# Patient Record
Sex: Female | Born: 1945 | Race: White | Hispanic: No | Marital: Married | Smoking: Former smoker
Health system: Southern US, Community
[De-identification: ages and names within clinical notes are randomized; demographics above are authoritative.]

## PROBLEM LIST (undated history)

## (undated) DIAGNOSIS — N979 Female infertility, unspecified: Secondary | ICD-10-CM

## (undated) DIAGNOSIS — C4491 Basal cell carcinoma of skin, unspecified: Secondary | ICD-10-CM

## (undated) DIAGNOSIS — K8020 Calculus of gallbladder without cholecystitis without obstruction: Secondary | ICD-10-CM

## (undated) DIAGNOSIS — H269 Unspecified cataract: Secondary | ICD-10-CM

## (undated) DIAGNOSIS — E079 Disorder of thyroid, unspecified: Secondary | ICD-10-CM

## (undated) DIAGNOSIS — I10 Essential (primary) hypertension: Secondary | ICD-10-CM

## (undated) DIAGNOSIS — E7800 Pure hypercholesterolemia, unspecified: Secondary | ICD-10-CM

## (undated) DIAGNOSIS — J189 Pneumonia, unspecified organism: Secondary | ICD-10-CM

## (undated) DIAGNOSIS — E119 Type 2 diabetes mellitus without complications: Secondary | ICD-10-CM

## (undated) DIAGNOSIS — F419 Anxiety disorder, unspecified: Secondary | ICD-10-CM

## (undated) DIAGNOSIS — K219 Gastro-esophageal reflux disease without esophagitis: Secondary | ICD-10-CM

## (undated) DIAGNOSIS — K589 Irritable bowel syndrome without diarrhea: Secondary | ICD-10-CM

## (undated) DIAGNOSIS — D219 Benign neoplasm of connective and other soft tissue, unspecified: Secondary | ICD-10-CM

## (undated) DIAGNOSIS — R011 Cardiac murmur, unspecified: Secondary | ICD-10-CM

## (undated) DIAGNOSIS — C4490 Unspecified malignant neoplasm of skin, unspecified: Secondary | ICD-10-CM

## (undated) HISTORY — DX: Disorder of thyroid, unspecified: E07.9

## (undated) HISTORY — DX: Essential (primary) hypertension: I10

## (undated) HISTORY — DX: Irritable bowel syndrome, unspecified: K58.9

## (undated) HISTORY — PX: ABDOMINAL HYSTERECTOMY: SHX81

## (undated) HISTORY — PX: MOHS SURGERY: SUR867

## (undated) HISTORY — DX: Benign neoplasm of connective and other soft tissue, unspecified: D21.9

## (undated) HISTORY — DX: Anxiety disorder, unspecified: F41.9

## (undated) HISTORY — DX: Basal cell carcinoma of skin, unspecified: C44.91

## (undated) HISTORY — DX: Unspecified malignant neoplasm of skin, unspecified: C44.90

## (undated) HISTORY — PX: EYE SURGERY: SHX253

## (undated) HISTORY — PX: SPINE SURGERY: SHX786

## (undated) HISTORY — PX: GALLBLADDER SURGERY: SHX652

## (undated) HISTORY — DX: Pure hypercholesterolemia, unspecified: E78.00

## (undated) HISTORY — DX: Unspecified cataract: H26.9

## (undated) HISTORY — DX: Pneumonia, unspecified organism: J18.9

## (undated) HISTORY — PX: LUMBAR DISC SURGERY: SHX700

## (undated) HISTORY — PX: BUNIONECTOMY: SHX129

## (undated) HISTORY — DX: Calculus of gallbladder without cholecystitis without obstruction: K80.20

## (undated) HISTORY — DX: Type 2 diabetes mellitus without complications: E11.9

## (undated) HISTORY — PX: OOPHORECTOMY: SHX86

## (undated) HISTORY — DX: Gastro-esophageal reflux disease without esophagitis: K21.9

## (undated) HISTORY — DX: Female infertility, unspecified: N97.9

## (undated) HISTORY — DX: Cardiac murmur, unspecified: R01.1

---
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
## 1973-03-20 HISTORY — PX: PILONIDAL CYST EXCISION: SHX744

## 1974-03-20 HISTORY — PX: CERVICAL CONE BIOPSY: SUR198

## 1995-03-21 HISTORY — PX: BUNIONECTOMY: SHX129

## 1995-03-21 HISTORY — PX: ABDOMINAL HYSTERECTOMY: SHX81

## 2004-03-20 HISTORY — PX: CATARACT EXTRACTION: SUR2

## 2015-03-25 LAB — HM MAMMOGRAPHY

## 2016-03-09 LAB — LIPID PANEL
Cholesterol: 170 (ref 0–200)
TRIGLYCERIDES: 359 — AB (ref 40–160)

## 2016-03-09 LAB — BASIC METABOLIC PANEL
CREATININE: 0.8 (ref 0.5–1.1)
GLUCOSE: 258
Potassium: 4.2 (ref 3.4–5.3)
Sodium: 136 — AB (ref 137–147)

## 2016-03-09 LAB — CBC AND DIFFERENTIAL
HEMATOCRIT: 39 (ref 36–46)
HEMOGLOBIN: 13.2 (ref 12.0–16.0)
PLATELETS: 307 (ref 150–399)
WBC: 8.7

## 2016-03-09 LAB — TSH: TSH: 3 (ref 0.41–5.90)

## 2016-04-05 LAB — HM MAMMOGRAPHY

## 2016-04-05 LAB — HM DEXA SCAN

## 2016-09-29 ENCOUNTER — Telehealth: Payer: Self-pay

## 2016-09-29 NOTE — Telephone Encounter (Signed)
Updated patient's meds, allergies, PMH, and family history according to new patient paperwork.  Paperwork also indicates patient had bone density test in 2017 (norma) and colonoscopy in 2017 (normal).  Will have to request records.  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  No date or other information is listed.  No imaging in chart.  New patient paperwork is at my desk.

## 2016-10-11 ENCOUNTER — Encounter: Payer: Self-pay | Admitting: Family Medicine

## 2016-10-11 ENCOUNTER — Ambulatory Visit (INDEPENDENT_AMBULATORY_CARE_PROVIDER_SITE_OTHER): Payer: Medicare Other | Admitting: Family Medicine

## 2016-10-11 VITALS — BP 124/76 | HR 83 | Temp 98.4°F | Ht 67.0 in | Wt 220.4 lb

## 2016-10-11 DIAGNOSIS — R05 Cough: Secondary | ICD-10-CM

## 2016-10-11 DIAGNOSIS — I10 Essential (primary) hypertension: Secondary | ICD-10-CM | POA: Diagnosis not present

## 2016-10-11 DIAGNOSIS — E1169 Type 2 diabetes mellitus with other specified complication: Secondary | ICD-10-CM | POA: Insufficient documentation

## 2016-10-11 DIAGNOSIS — E119 Type 2 diabetes mellitus without complications: Secondary | ICD-10-CM

## 2016-10-11 DIAGNOSIS — Z79890 Hormone replacement therapy (postmenopausal): Secondary | ICD-10-CM | POA: Diagnosis not present

## 2016-10-11 DIAGNOSIS — E785 Hyperlipidemia, unspecified: Secondary | ICD-10-CM | POA: Diagnosis not present

## 2016-10-11 DIAGNOSIS — R059 Cough, unspecified: Secondary | ICD-10-CM

## 2016-10-11 DIAGNOSIS — F419 Anxiety disorder, unspecified: Secondary | ICD-10-CM | POA: Diagnosis not present

## 2016-10-11 DIAGNOSIS — G44009 Cluster headache syndrome, unspecified, not intractable: Secondary | ICD-10-CM

## 2016-10-11 DIAGNOSIS — E1159 Type 2 diabetes mellitus with other circulatory complications: Secondary | ICD-10-CM | POA: Insufficient documentation

## 2016-10-11 DIAGNOSIS — E039 Hypothyroidism, unspecified: Secondary | ICD-10-CM | POA: Diagnosis not present

## 2016-10-11 DIAGNOSIS — Z85828 Personal history of other malignant neoplasm of skin: Secondary | ICD-10-CM | POA: Diagnosis not present

## 2016-10-11 DIAGNOSIS — J029 Acute pharyngitis, unspecified: Secondary | ICD-10-CM | POA: Insufficient documentation

## 2016-10-11 LAB — CBC WITH DIFFERENTIAL/PLATELET
Basophils Absolute: 0.1 10*3/uL (ref 0.0–0.1)
Basophils Relative: 1.3 % (ref 0.0–3.0)
Eosinophils Absolute: 0.2 10*3/uL (ref 0.0–0.7)
Eosinophils Relative: 2.6 % (ref 0.0–5.0)
HCT: 44.9 % (ref 36.0–46.0)
Hemoglobin: 14.8 g/dL (ref 12.0–15.0)
Lymphocytes Relative: 20.9 % (ref 12.0–46.0)
Lymphs Abs: 1.8 10*3/uL (ref 0.7–4.0)
MCHC: 32.9 g/dL (ref 30.0–36.0)
MCV: 90.4 fl (ref 78.0–100.0)
Monocytes Absolute: 0.4 10*3/uL (ref 0.1–1.0)
Monocytes Relative: 4.2 % (ref 3.0–12.0)
Neutro Abs: 6.2 10*3/uL (ref 1.4–7.7)
Neutrophils Relative %: 71 % (ref 43.0–77.0)
Platelets: 290 10*3/uL (ref 150.0–400.0)
RBC: 4.96 Mil/uL (ref 3.87–5.11)
RDW: 14.6 % (ref 11.5–15.5)
WBC: 8.7 10*3/uL (ref 4.0–10.5)

## 2016-10-11 LAB — COMPREHENSIVE METABOLIC PANEL
ALT: 26 U/L (ref 0–35)
AST: 21 U/L (ref 0–37)
Albumin: 4.1 g/dL (ref 3.5–5.2)
Alkaline Phosphatase: 62 U/L (ref 39–117)
BUN: 20 mg/dL (ref 6–23)
CO2: 27 mEq/L (ref 19–32)
Calcium: 10 mg/dL (ref 8.4–10.5)
Chloride: 101 mEq/L (ref 96–112)
Creatinine, Ser: 0.84 mg/dL (ref 0.40–1.20)
GFR: 71.49 mL/min (ref >60.00)
Glucose, Bld: 151 mg/dL — ABNORMAL HIGH (ref 70–99)
Potassium: 3.8 mEq/L (ref 3.5–5.1)
Sodium: 138 mEq/L (ref 135–145)
Total Bilirubin: 0.7 mg/dL (ref 0.2–1.2)
Total Protein: 7.2 g/dL (ref 6.0–8.3)

## 2016-10-11 LAB — T4, FREE: Free T4: 1.43 ng/dL (ref 0.60–1.60)

## 2016-10-11 LAB — LIPID PANEL
Cholesterol: 177 mg/dL (ref 0–200)
HDL: 46.9 mg/dL (ref >39.00)
NonHDL: 129.68
Total CHOL/HDL Ratio: 4
Triglycerides: 250 mg/dL — ABNORMAL HIGH (ref 0.0–149.0)
VLDL: 50 mg/dL — ABNORMAL HIGH (ref 0.0–40.0)

## 2016-10-11 LAB — LDL CHOLESTEROL, DIRECT: Direct LDL: 93 mg/dL

## 2016-10-11 LAB — TSH: TSH: 1.16 u[IU]/mL (ref 0.35–4.50)

## 2016-10-11 LAB — HEMOGLOBIN A1C: Hgb A1c MFr Bld: 6.8 % — ABNORMAL HIGH (ref 4.6–6.5)

## 2016-10-11 MED ORDER — METFORMIN HCL ER 500 MG PO TB24: 500.0000 mg | ORAL_TABLET | Freq: Three times a day (TID) | ORAL | 1 refills | Status: DC

## 2016-10-11 MED ORDER — ALPRAZOLAM 0.5 MG PO TABS: 0.5000 mg | ORAL_TABLET | Freq: Every evening | ORAL | 2 refills | Status: DC | PRN

## 2016-10-11 MED ORDER — ATORVASTATIN CALCIUM 40 MG PO TABS: 40.0000 mg | ORAL_TABLET | Freq: Every day | ORAL | 3 refills | Status: DC

## 2016-10-11 MED ORDER — METFORMIN HCL ER (MOD) 500 MG PO TB24: 500.0000 mg | ORAL_TABLET | Freq: Three times a day (TID) | ORAL | 1 refills | Status: DC

## 2016-10-11 MED ORDER — PIOGLITAZONE HCL 30 MG PO TABS: 30.0000 mg | ORAL_TABLET | Freq: Every day | ORAL | 1 refills | Status: DC

## 2016-10-11 NOTE — Patient Instructions (Signed)
We have placed referrals to gynecology, podiatry, and dermatology for you.  You will be contacted to schedule appointments.

## 2016-10-11 NOTE — Progress Notes (Signed)
Brenda Griffin is a 71 y.o. female is here to [facility].   Patient Care Team: Briscoe Deutscher, DO as PCP - General (Family Medicine)   History of Present Illness:   Brenda Griffin, CMA, acting as scribe for Dr. Juleen China.  HPI: Patient comes in to establish care today.  States she has a sore throat and a cough.  This has been going on for 1 month.  She went to an urgent care some weeks ago and was diagnosed with a sinus infection.  She took an antibiotic and that cleared up the sinus infection.  She continues to have a cough, however.  She has been using sugar-free cough drops.  Cough is nonproductive.  Patient would like refills on her HRT.  She has taken Ambien in the past but states she rarely takes it anymore.    Patient is diabetic.  Diagnosed 8 years ago.  States she was initially told she was prediabetic and then a new doctor told her she had been diabetic for 2 years.  Unable to take Victoza due to diarrhea.  Taking extended release metformin, Jardiance, Actos, and Amaryl.  She thinks her last A1c was 7.0.    She has a history of cluster headaches and was taking Fioricet for them.  States that after her hysterectomy her headaches improved.  Rarely takes Fioricet anymore.  Health Maintenance Due  Topic Date Due  . HEMOGLOBIN A1C  07-Dec-1945  . Hepatitis C Screening  Apr 04, 1945  . FOOT EXAM  12/20/1957  . OPHTHALMOLOGY EXAM  12/20/1957  . TETANUS/TDAP  12/21/1966  . MAMMOGRAM  12/20/1997  . COLONOSCOPY  12/20/1997  . DEXA SCAN  12/20/2012  . PNA vac Low Risk Adult (1 of 2 - PCV13) 12/20/2012   Depression screen PHQ 2/9 10/11/2016  Decreased Interest 0  Down, Depressed, Hopeless 0  PHQ - 2 Score 0   PMHx, SurgHx, SocialHx, Medications, and Allergies were reviewed in the Visit Navigator and updated as appropriate.   Past Medical History:  Diagnosis Date  . Anxiety   . Cancer ([place])    skin  . Cataract   . Diabetes mellitus without complication ([place])   . GERD  (gastroesophageal reflux disease)   . Heart murmur   . Hypertension   . Thyroid disease     Past Surgical History:  Procedure Laterality Date  . ABDOMINAL HYSTERECTOMY    . EYE SURGERY    . GALLBLADDER SURGERY    . SPINE SURGERY      Family History  Problem Relation Age of Onset  . Stroke Mother   . Heart disease Father   . Hyperlipidemia Father   . Hypertension Father   . Hypertension Sister   . Heart disease Paternal Grandmother   . Hypertension Paternal Grandmother   . Diabetes Brother   . Hypertension Brother   . Diabetes Brother    Social History  Substance Use Topics  . Smoking status: Former Research scientist (life sciences)  . Smokeless tobacco: Never Used  . Alcohol use 0.6 - 1.2 oz/week    1 - 2 Glasses of wine per week   Current Medications and Allergies:   Current Outpatient Prescriptions:  .  acetaminophen (TYLENOL) 500 MG tablet, Take 500 mg by mouth as needed., Disp: , Rfl:  .  ALPRAZolam (XANAX) 0.5 MG tablet, Take 1 tablet (0.5 mg total) by mouth at bedtime as needed for anxiety., Disp: 30 tablet, Rfl: 2 .  atorvastatin (LIPITOR) 40 MG tablet, Take 1 tablet (40 mg  total) by mouth daily., Disp: 90 tablet, Rfl: 3 .  Biotin 10000 MCG TABS, Take 1 tablet by mouth daily as needed., Disp: , Rfl:  .  Butalbital-APAP-Caffeine 50-300-40 MG CAPS, Take 1 tablet by mouth as needed., Disp: , Rfl:  .  diphenhydrAMINE (BENADRYL) 25 MG tablet, Take 25 mg by mouth as needed., Disp: , Rfl:  .  empagliflozin (JARDIANCE) 10 MG TABS tablet, Take 10 mg by mouth daily., Disp: , Rfl:  .  estradiol (ESTRACE) 1 MG tablet, Take 1 mg by mouth daily., Disp: , Rfl:  .  glimepiride (AMARYL) 2 MG tablet, Take 2 mg by mouth 4 (four) times daily., Disp: , Rfl:  .  levothyroxine (SYNTHROID, LEVOTHROID) 100 MCG tablet, Take 100 mcg by mouth daily before breakfast., Disp: , Rfl:  .  loperamide (IMODIUM A-D) 2 MG tablet, Take 2 mg by mouth as needed for diarrhea or loose stools., Disp: , Rfl:  .   losartan-hydrochlorothiazide (HYZAAR) 100-12.5 MG tablet, Take 1 tablet by mouth daily., Disp: , Rfl:  .  metFORMIN (GLUCOPHAGE-XR) 500 MG 24 hr tablet, Take 1 tablet (500 mg total) by mouth 3 (three) times daily., Disp: 270 tablet, Rfl: 1 .  pantoprazole (PROTONIX) 40 MG tablet, Take 40 mg by mouth daily., Disp: , Rfl:  .  pioglitazone (ACTOS) 30 MG tablet, Take 1 tablet (30 mg total) by mouth daily., Disp: 90 tablet, Rfl: 1 .  senna (SENOKOT) 8.6 MG tablet, Take 1 tablet by mouth as needed for constipation., Disp: , Rfl:  .  zolpidem (AMBIEN CR) 6.25 MG CR tablet, Take 6.25 mg by mouth at bedtime as needed for sleep., Disp: , Rfl:   Allergies  Allergen Reactions  . Bacitracin Rash  . Benzoic Acid Rash  . Benzyl Salicylate Rash  . Formaldehyde Rash  . Geraniol Rash  . Hydroxyethyl Methacrylate Rash  . Methyl Methacrylate [Methylmethacrylate] Rash  . Other Rash    Amyl Cinnamaldehyde  . Adhesive [Tape] Itching  . Aspirin Nausea And Vomiting  . Codeine Nausea And Vomiting  . Victoza [Liraglutide] Diarrhea  . Butanediol [Butylene Glycol] Rash   Review of Systems:   Pertinent items are noted in the HPI. Otherwise, ROS is negative.  Vitals:   Vitals:   10/11/16 1007  BP: 124/76  Pulse: 83  Temp: 98.4 F (36.9 C)  TempSrc: Oral  SpO2: 97%  Weight: 220 lb 6.4 oz (100 kg)  Height: 5\' 7"  (1.702 m)     Body mass index is 34.52 kg/m.  Physical Exam:   Physical Exam  Constitutional: She appears well-nourished.  HENT:  Head: Normocephalic and atraumatic.  Eyes: Pupils are equal, round, and reactive to light. EOM are normal.  Neck: Normal range of motion. Neck supple.  Cardiovascular: Normal rate, regular rhythm, normal heart sounds and intact distal pulses.   Pulmonary/Chest: Effort normal.  Abdominal: Soft.  Skin: Skin is warm.  Psychiatric: She has a normal mood and affect. Her behavior is normal.  Nursing note and vitals reviewed.  Assessment and Plan:   Problem    Hormone Replacement Therapy   The patient is currently on hormone replacement therapy. She wishes to continue with this. I let her know that I'm okay with refilling her prescription at this time, but I will be ultimately having Gynecology take over.   Cluster Headaches   Nearly resolved. Rarely needs butalbital.   Type 2 Diabetes Mellitus Without Complication, Without Long-Term Current Use of Insulin (Hcc)   Controlled per patient.  No signs of complications, medication side effects, or red flags.  Continue current regimen.  Labs pending.    Hypothyroidism   Controlled per patient.  No signs of complications, medication side effects, or red flags.  Continue current regimen.  Labs pending.    Cough   Bronchospasm associated with recent illness. Discussed symptomatic care and red flags for calling. Warned her that it may take up to 6 weeks for the cough to resolved.    Essential Hypertension   Controlled per patient.  No signs of complications, medication side effects, or red flags.  Continue current regimen.  Labs pending.    History of Skin Cancer   Referral to Dermatology.   Hyperlipidemia   Controlled per patient.  No signs of complications, medication side effects, or red flags.  Continue current regimen.  Labs pending.    Anxiety   Uses Xanax sparingly. No concerns. We discussed options for treatment of anxiety including therapy and/or medication. Discussed potential risks, expected benefits, possible side effects of the medicine. We also discussed how to take it correctly and dosing instructions. If she has any significant side effects to the medicine, she is to stop it and call for advice.   Morbid Obesity (Hcc)   The patient is asked to make an attempt to improve diet and exercise patterns to aid in medical management of this problem.       Orders Placed This Encounter  Procedures  . CBC with Differential/Platelet  . Comprehensive metabolic panel  . Lipid panel  .  TSH  . T4, free  . Hemoglobin A1c  . Ambulatory referral to Obstetrics / Gynecology  . Ambulatory referral to Dermatology  . Ambulatory referral to Podiatry   . Reviewed expectations re: course of current medical issues. . Discussed self-management of symptoms. . Outlined signs and symptoms indicating need for more acute intervention. . Patient verbalized understanding and all questions were answered. Marland Kitchen Health Maintenance issues including appropriate healthy diet, exercise, and smoking avoidance were discussed with patient. . See orders for this visit as documented in the electronic medical record. . Patient received an After Visit Summary.  CMA served as Education administrator during this visit. History, Physical, and Plan performed by medical provider. The above documentation has been reviewed and is accurate and complete. Briscoe Deutscher, D.O.  Briscoe Deutscher, DO [place] [place] 10/11/2016  Future Appointments Date Time Provider [place]  01/10/2017 10:00 AM Briscoe Deutscher, DO [facility] None

## 2016-10-17 ENCOUNTER — Telehealth: Payer: Self-pay | Admitting: Obstetrics and Gynecology

## 2016-10-17 NOTE — Telephone Encounter (Signed)
Called and left a message for patient to call back to schedule a new patient doctor referral. °

## 2016-11-01 ENCOUNTER — Telehealth: Payer: Self-pay | Admitting: Family Medicine

## 2016-11-01 NOTE — Telephone Encounter (Signed)
MEDICATION: butal-acet-caff 50-325-40MG   PHARMACY:    [facility] - [place], [place] - [address] [phone] (Phone) [phone] (Fax)     IS THIS A 90 DAY SUPPLY : Y  IS PATIENT OUT OF MEDICTAION: Y  IF NOT; HOW MUCH IS LEFT:   LAST APPOINTMENT DATE: 10/11/16  NEXT APPOINTMENT DATE: 01/10/17  OTHER COMMENTS:    **Let patient know to contact pharmacy at the end of the day to make sure medication is ready. **  ** Please notify patient to allow 48-72 hours to process**  **Encourage patient to contact the pharmacy for refills or they can request refills through [facility]**

## 2016-11-01 NOTE — Telephone Encounter (Signed)
Please advise 

## 2016-11-02 ENCOUNTER — Other Ambulatory Visit: Payer: Self-pay

## 2016-11-02 MED ORDER — BUTALBITAL-APAP-CAFFEINE 50-300-40 MG PO CAPS: 1.0000 | ORAL_CAPSULE | ORAL | 0 refills | Status: DC | PRN

## 2016-11-02 NOTE — Telephone Encounter (Signed)
Okay refill. 

## 2016-11-03 ENCOUNTER — Other Ambulatory Visit: Payer: Self-pay

## 2016-11-03 MED ORDER — BUTALBITAL-APAP-CAFFEINE 50-325-40 MG PO TABS
1.0000 | ORAL_TABLET | ORAL | 0 refills | Status: DC | PRN
Start: 2016-11-03 — End: 2016-12-13

## 2016-11-03 NOTE — Telephone Encounter (Signed)
Please call pharmacy to advise on whether or not another version of the script is ok for pt to take. Insurance does not cover tablet form and the tylenol is 25mg  higher than what they want to consider.  Ty,  -LL

## 2016-11-03 NOTE — Telephone Encounter (Signed)
Spoke with pharmacy.  Corrected script sent in.

## 2016-12-04 ENCOUNTER — Ambulatory Visit: Payer: Medicare Other | Admitting: Obstetrics and Gynecology

## 2016-12-04 ENCOUNTER — Telehealth: Payer: Self-pay | Admitting: Family Medicine

## 2016-12-04 ENCOUNTER — Encounter: Payer: Self-pay | Admitting: Family Medicine

## 2016-12-04 ENCOUNTER — Ambulatory Visit (INDEPENDENT_AMBULATORY_CARE_PROVIDER_SITE_OTHER): Payer: Medicare Other | Admitting: Family Medicine

## 2016-12-04 VITALS — BP 144/68 | HR 97 | Temp 98.4°F | Wt 220.6 lb

## 2016-12-04 DIAGNOSIS — J069 Acute upper respiratory infection, unspecified: Secondary | ICD-10-CM | POA: Diagnosis not present

## 2016-12-04 DIAGNOSIS — B9789 Other viral agents as the cause of diseases classified elsewhere: Secondary | ICD-10-CM | POA: Diagnosis not present

## 2016-12-04 NOTE — Patient Instructions (Signed)
For muscle aches, headache, sore throat you can take over the counter acetaminophen as directed on the package For nasal congestion you can use Afrin nasal spray twice a day for up to 3 days, and/or saline nasal spray For cough you can use Delsym cough syrup Please come back to see [place] or go to the emergency department if you are not better in 5 to 7 days or if you develop fever over 101 for more than 48 hours or if you develop wheezing or shortness of breath.

## 2016-12-04 NOTE — Telephone Encounter (Signed)
Noted  

## 2016-12-04 NOTE — Telephone Encounter (Signed)
Patient is being seen at 11am with Debbie for sore throat, ears hurt, hurts when swallowing

## 2016-12-04 NOTE — Progress Notes (Signed)
Subjective:    Patient ID: Brenda Griffin, female    DOB: 1945-12-16, 71 y.o.   MRN: [id number]  HPI This is a 71 yo female who presents today with sore throat x 3 days, ears stopped up, cough occasionally productive. Sinuses throbbing. Pain with swallowing. Minor temperature per patient. No nausea or vomiting. No SOB, no wheeze. Taking Tylenol Cold medication without relief yesterday. Slept very well last night. Has not taken anything for symptoms since yesterday afternoon. Has seasonal allergies, not currently taking any medication. Tried fluticasone in past, caused nose bleeds.  Husband with sore throat x 1 day recently. No other sick contacts.     Past Medical History:  Diagnosis Date  . Anxiety   . Cancer ([place])    skin  . Cataract   . Diabetes mellitus without complication ([place])   . GERD (gastroesophageal reflux disease)   . Heart murmur   . Hypertension   . Thyroid disease    Past Surgical History:  Procedure Laterality Date  . ABDOMINAL HYSTERECTOMY    . EYE SURGERY    . GALLBLADDER SURGERY    . SPINE SURGERY     Family History  Problem Relation Age of Onset  . Stroke Mother   . Heart disease Father   . Hyperlipidemia Father   . Hypertension Father   . Hypertension Sister   . Heart disease Paternal Grandmother   . Hypertension Paternal Grandmother   . Diabetes Brother   . Hypertension Brother   . Diabetes Brother    Social History  Substance Use Topics  . Smoking status: Former Research scientist (life sciences)  . Smokeless tobacco: Never Used  . Alcohol use 0.6 - 1.2 oz/week    1 - 2 Glasses of wine per week      Review of Systems Per HPI    Objective:   Physical Exam  Constitutional: She is oriented to person, place, and time. She appears well-developed and well-nourished. No distress.  HENT:  Head: Normocephalic and atraumatic.  Right Ear: Tympanic membrane, external ear and ear canal normal.  Left Ear: Tympanic membrane, external ear and ear canal normal.  Nose: Nose  normal.  Mouth/Throat: Uvula is midline. Posterior oropharyngeal erythema (mild) present. No oropharyngeal exudate or posterior oropharyngeal edema.  Post nasal drainage noted.   Eyes: Conjunctivae are normal.  Neck: Normal range of motion. Neck supple.  Cardiovascular: Normal rate, regular rhythm and normal heart sounds.   Pulmonary/Chest: Effort normal and breath sounds normal.  Lymphadenopathy:    She has no cervical adenopathy.  Neurological: She is alert and oriented to person, place, and time.  Skin: Skin is warm and dry. She is not diaphoretic.  Psychiatric: She has a normal mood and affect. Her behavior is normal. Judgment and thought content normal.  Vitals reviewed.     BP (!) 144/68 (BP Location: Left Arm, Patient Position: Sitting, Cuff Size: Normal)   Pulse 97   Temp 98.4 F (36.9 C) (Oral)   Wt 220 lb 9.6 oz (100.1 kg)   SpO2 96%   BMI 34.55 kg/m  Wt Readings from Last 3 Encounters:  12/04/16 220 lb 9.6 oz (100.1 kg)  10/11/16 220 lb 6.4 oz (100 kg)       Assessment & Plan:  1. Viral URI with cough - Provided written and verbal information regarding diagnosis and treatment. - Patient Instructions  For muscle aches, headache, sore throat you can take over the counter acetaminophen as directed on the package For nasal congestion you  can use Afrin nasal spray twice a day for up to 3 days, and/or saline nasal spray For cough you can use Delsym cough syrup Please come back to see [place] or go to the emergency department if you are not better in 5 to 7 days or if you develop fever over 101 for more than 48 hours or if you develop wheezing or shortness of breath.    Clarene Reamer, FNP-BC  [facility] [place]  12/04/2016 11:24 AM

## 2016-12-13 ENCOUNTER — Ambulatory Visit (INDEPENDENT_AMBULATORY_CARE_PROVIDER_SITE_OTHER): Payer: Medicare Other | Admitting: Obstetrics and Gynecology

## 2016-12-13 ENCOUNTER — Encounter: Payer: Self-pay | Admitting: Obstetrics and Gynecology

## 2016-12-13 VITALS — BP 140/72 | HR 80 | Resp 16 | Ht 66.0 in | Wt 220.0 lb

## 2016-12-13 DIAGNOSIS — Z7189 Other specified counseling: Secondary | ICD-10-CM | POA: Diagnosis not present

## 2016-12-13 DIAGNOSIS — N9089 Other specified noninflammatory disorders of vulva and perineum: Secondary | ICD-10-CM

## 2016-12-13 DIAGNOSIS — Z01419 Encounter for gynecological examination (general) (routine) without abnormal findings: Secondary | ICD-10-CM | POA: Diagnosis not present

## 2016-12-13 MED ORDER — ESTRADIOL 1 MG PO TABS: 1.0000 mg | ORAL_TABLET | Freq: Every day | ORAL | 3 refills | Status: DC

## 2016-12-13 NOTE — Progress Notes (Addendum)
71 y.o. G0P0000 MarriedCaucasianF here for HRT consult. She is on estradiol 1 mg a day, has been on them for 18+ years since she had her hysterectomy/BSO. She wants to stay on the HRT. She has tried going down on the HRT 4 years ago and again 3 years ago. Each time she has night sweats, dry eye, dry skin. She can't use the patch, has an allergy to the adhesive.  On her current dose she feels good, no menopausal symptoms.  Sexually active, no dyspareunia.  She has diabetes, last HgbA1C was 6.8. She denies a h/o CAD, PVD.  She is due for an annual exam.     No LMP recorded. Patient is postmenopausal.          Sexually active: Yes.    The current method of family planning is post menopausal status.    Exercising: No.  The patient does not participate in regular exercise at present. Smoker:  Former smoker  Health Maintenance: Pap:  2015 WNL per patient  History of abnormal Pap:  Yes- Cervical Cone biopsy  MMG:  2018 WNL per patient  Colonoscopy:  2018 WNL per patient  BMD:   2018 WNL per patient  TDaP:  unsure Gardasil: N/A   reports that she has quit smoking. She has never used smokeless tobacco. She reports that she drinks about 0.6 - 1.2 oz of alcohol per week . She reports that she does not use drugs.Retired, moved here in 3/18 from [place].   Past Medical History:  Diagnosis Date  . Anxiety   . Cancer ([place])    skin  . Cataract   . Diabetes mellitus without complication ([place])   . Fibroid   . GERD (gastroesophageal reflux disease)   . Heart murmur   . Hypercholesterolemia   . Hypertension   . Infertility, female   . Thyroid disease     Past Surgical History:  Procedure Laterality Date  . ABDOMINAL HYSTERECTOMY    . BUNIONECTOMY  1997  . CERVICAL CONE BIOPSY  1976  . EYE SURGERY    . GALLBLADDER SURGERY    . MOHS SURGERY  2005/2007  . OOPHORECTOMY    . PILONIDAL CYST EXCISION  1975  . SPINE SURGERY    . TONSILLECTOMY  1957    Current Outpatient Prescriptions   Medication Sig Dispense Refill  . acetaminophen (TYLENOL) 500 MG tablet Take 500 mg by mouth as needed.    . ALPRAZolam (XANAX) 0.5 MG tablet Take 1 tablet (0.5 mg total) by mouth at bedtime as needed for anxiety. 30 tablet 2  . atorvastatin (LIPITOR) 40 MG tablet Take 1 tablet (40 mg total) by mouth daily. 90 tablet 3  . Biotin 10000 MCG TABS Take 1 tablet by mouth daily as needed.    . Butalbital-APAP-Caffeine 50-300-40 MG CAPS Take 1 tablet by mouth as needed. 84 capsule 0  . diphenhydrAMINE (BENADRYL) 25 MG tablet Take 25 mg by mouth as needed.    . empagliflozin (JARDIANCE) 10 MG TABS tablet Take 10 mg by mouth daily.    Marland Kitchen estradiol (ESTRACE) 1 MG tablet Take 1 mg by mouth daily.    Marland Kitchen glimepiride (AMARYL) 2 MG tablet Take 2 mg by mouth 4 (four) times daily.    Marland Kitchen levothyroxine (SYNTHROID, LEVOTHROID) 100 MCG tablet Take 100 mcg by mouth daily before breakfast.    . loperamide (IMODIUM A-D) 2 MG tablet Take 2 mg by mouth as needed for diarrhea or loose stools.    Marland Kitchen  losartan-hydrochlorothiazide (HYZAAR) 100-12.5 MG tablet Take 1 tablet by mouth daily.    . metFORMIN (GLUCOPHAGE-XR) 500 MG 24 hr tablet Take 1 tablet (500 mg total) by mouth 3 (three) times daily. 270 tablet 1  . pantoprazole (PROTONIX) 40 MG tablet Take 40 mg by mouth daily.    . pioglitazone (ACTOS) 30 MG tablet Take 1 tablet (30 mg total) by mouth daily. 90 tablet 1  . senna (SENOKOT) 8.6 MG tablet Take 1 tablet by mouth as needed for constipation.    Marland Kitchen zolpidem (AMBIEN CR) 6.25 MG CR tablet Take 6.25 mg by mouth at bedtime as needed for sleep.     No current facility-administered medications for this visit.     Family History  Problem Relation Age of Onset  . Stroke Mother   . Heart disease Father   . Hyperlipidemia Father   . Hypertension Father   . Hypertension Sister   . Heart disease Paternal Grandmother   . Hypertension Paternal Grandmother   . Diabetes Brother   . Hypertension Brother   . Diabetes Brother      Review of Systems  Constitutional: Negative.   HENT: Negative.   Eyes: Negative.   Respiratory: Negative.   Cardiovascular: Negative.   Gastrointestinal: Negative.   Endocrine: Negative.   Genitourinary: Negative.   Musculoskeletal: Negative.   Skin: Negative.   Allergic/Immunologic: Negative.   Neurological: Negative.   Psychiatric/Behavioral: Negative.     Exam:   BP 140/72 (BP Location: Right Arm, Patient Position: Sitting, Cuff Size: Normal)   Pulse 80   Resp 16   Ht 5\' 6"  (1.676 m)   Wt 220 lb (99.8 kg)   BMI 35.51 kg/m   Weight change: @WEIGHTCHANGE @ Height:   Height: 5\' 6"  (167.6 cm)  Ht Readings from Last 3 Encounters:  12/13/16 5\' 6"  (1.676 m)  10/11/16 5\' 7"  (1.702 m)    General appearance: alert, cooperative and appears stated age Head: Normocephalic, without obvious abnormality, atraumatic Neck: no adenopathy, supple, symmetrical, trachea midline and thyroid normal to inspection and palpation Lungs: clear to auscultation bilaterally Cardiovascular: regular rate and rhythm Breasts: normal appearance, no masses or tenderness Abdomen: soft, non-tender; non distended,  no masses,  no organomegaly Extremities: extremities normal, atraumatic, no cyanosis or edema Skin: Skin color, texture, turgor normal. No rashes or lesions Lymph nodes: Cervical, supraclavicular, and axillary nodes normal. No abnormal inguinal nodes palpated Neurologic: Grossly normal   Pelvic: External genitalia:  On the inner right labia minora is a pigment change, area of increased whitening, 6-7 mm              Urethra:  normal appearing urethra with no masses, tenderness or lesions              Bartholins and Skenes: normal                 Vagina: normal appearing vagina with normal color and discharge, no lesions              Cervix: absent               Bimanual Exam:  Uterus:  uterus absent              Adnexa: no mass, fullness, tenderness               Rectovaginal: Confirms                Anus:  normal sphincter tone, no lesions  Chaperone was  present for exam.  A:  Well Woman with normal exam  ERT, we discussed the risks, she states that it is a quality of life issue for her  Obesity, HTN, Elevated lipids, AODM  P:   Would check a pap at 5 years (S/P TAH)  given distant h/o dysplasia  Yearly mammogram  Discussed breast self exam  Discussed calcium and vit D intake  Colonoscopy UTD  DEXA UTD and normal  She is working on weight loss, we discussed trying to decrease her ERT dose again, she will try this once she looses some weight.  CC: Dr Juleen China   Addendum: The patient does understand that her medical issues increase her risk of MI with the ERT, she understands the risk of blood clots and stroke. She understands that a patch would be better than a pill and that she should be on the lowest dose possible. She has an allergy to the patch. She states she has been miserable when trying to wean off of the ERT. She states that her quality of life on the ERT is even worth the risk of early death.

## 2016-12-13 NOTE — Patient Instructions (Addendum)
Menopause and Hormone Replacement Therapy What is hormone replacement therapy? Hormone replacement therapy (HRT) is the use of artificial (synthetic) hormones to replace hormones that your body stops producing during menopause. Menopause is the normal time of life when menstrual periods stop completely and the ovaries stop producing the female hormones estrogen and progesterone. This lack of hormones can affect your health and cause undesirable symptoms. HRT can relieve some of those symptoms. What are my options for HRT? HRT may consist of the synthetic hormones estrogen and progestin, or it may consist of only estrogen (estrogen-only therapy). You and your health care provider will decide which form of HRT is best for you. If you choose to be on HRT and you have a uterus, estrogen and progestin are usually prescribed. Estrogen-only therapy is used for women who do not have a uterus. Possible options for taking HRT include:  Pills.  Patches.  Gels.  Sprays.  Vaginal cream.  Vaginal rings.  Vaginal inserts.  The amount of hormone(s) that you take and how long you take the hormone(s) varies depending on your individual health. It is important to:  Begin HRT with the lowest possible dosage.  Stop HRT as soon as your health care provider tells you to stop.  Work with your health care provider so that you feel informed and comfortable with your decisions.  What are the benefits of HRT? HRT can reduce the frequency and severity of menopausal symptoms. Benefits of HRT vary depending on the menopausal symptoms that you have, the severity of your symptoms, and your overall health. HRT may help to improve the following menopausal symptoms:  Hot flashes and night sweats. These are sudden feelings of heat that spread over the face and body. The skin may turn red, like a blush. Night sweats are hot flashes that happen while you are sleeping or trying to sleep.  Bone loss (osteoporosis). The  body loses calcium more quickly after menopause, causing the bones to become weaker. This can increase the risk for bone breaks (fractures).  Vaginal dryness. The lining of the vagina can become thin and dry, which can cause pain during sexual intercourse or cause infection, burning, or itching.  Urinary tract infections.  Urinary incontinence. This is a decreased ability to control when you urinate.  Irritability.  Short-term memory problems.  What are the risks of HRT? Risks of HRT vary depending on your individual health and medical history. Risks of HRT also depend on whether you receive both estrogen and progestin or you receive estrogen only.HRT may increase the risk of:  Spotting. This is when a small amount of bloodleaks from the vagina unexpectedly.  Endometrial cancer. This cancer is in the lining of the uterus (endometrium).  Breast cancer.  Increased density of breast tissue. This can make it harder to find breast cancer on a breast X-ray (mammogram).  Stroke.  Heart attack.  Blood clots.  Gallbladder disease.  Risks of HRT can increase if you have any of the following conditions:  Endometrial cancer.  Liver disease.  Heart disease.  Breast cancer.  History of blood clots.  History of stroke.  How should I care for myself while I am on HRT?  Take over-the-counter and prescription medicines only as told by your health care provider.  Get mammograms, pelvic exams, and medical checkups as often as told by your health care provider.  Have Pap tests done as often as told by your health care provider. A Pap test is sometimes called a Pap   smear. It is a screening test that is used to check for signs of cancer of the cervix and vagina. A Pap test can also identify the presence of infection or precancerous changes. Pap tests may be done: ? Every 3 years, starting at age 60. ? Every 5 years, starting after age 51, in combination with testing for human  papillomavirus (HPV). ? More often or less often depending on other medical conditions you have, your age, and other risk factors.  It is your responsibility to get your Pap test results. Ask your health care provider or the department performing the test when your results will be ready.  Keep all follow-up visits as told by your health care provider. This is important. When should I seek medical care? Talk with your health care provider if:  You have any of these: ? Pain or swelling in your legs. ? Shortness of breath. ? Chest pain. ? Lumps or changes in your breasts or armpits. ? Slurred speech. ? Pain, burning, or bleeding when you urine.  You develop any of these: ? Unusual vaginal bleeding. ? Dizziness or headaches. ? Weakness or numbness in any part of your arms or legs. ? Pain in your abdomen.  This information is not intended to replace advice given to you by your health care provider. Make sure you discuss any questions you have with your health care provider. Document Released: 12/03/2002 Document Revised: 02/01/2016 Document Reviewed: 09/07/2014 Elsevier Interactive Patient Education  2017 [place] AND DIET:  We recommended that you start or continue a regular exercise program for good health. Regular exercise means any activity that makes your heart beat faster and makes you sweat.  We recommend exercising at least 30 minutes per day at least 3 days a week, preferably 4 or 5.  We also recommend a diet low in fat and sugar.  Inactivity, poor dietary choices and obesity can cause diabetes, heart attack, stroke, and kidney damage, among others.    ALCOHOL AND SMOKING:  Women should limit their alcohol intake to no more than 7 drinks/beers/glasses of wine (combined, not each!) per week. Moderation of alcohol intake to this level decreases your risk of breast cancer and liver damage. And of course, no recreational drugs are part of a healthy lifestyle.  And  absolutely no smoking or even second hand smoke. Most people know smoking can cause heart and lung diseases, but did you know it also contributes to weakening of your bones? Aging of your skin?  Yellowing of your teeth and nails?  CALCIUM AND VITAMIN D:  Adequate intake of calcium and Vitamin D are recommended.  The recommendations for exact amounts of these supplements seem to change often, but generally speaking 600 mg of calcium (either carbonate or citrate) and 800 units of Vitamin D per day seems prudent. Certain women may benefit from higher intake of Vitamin D.  If you are among these women, your doctor will have told you during your visit.    PAP SMEARS:  Pap smears, to check for cervical cancer or precancers,  have traditionally been done yearly, although recent scientific advances have shown that most women can have pap smears less often.  However, every woman still should have a physical exam from her gynecologist every year. It will include a breast check, inspection of the vulva and vagina to check for abnormal growths or skin changes, a visual exam of the cervix, and then an exam to evaluate the size and shape  of the uterus and ovaries.  And after 69 years of age, a rectal exam is indicated to check for rectal cancers. We will also provide age appropriate advice regarding health maintenance, like when you should have certain vaccines, screening for sexually transmitted diseases, bone density testing, colonoscopy, mammograms, etc.   MAMMOGRAMS:  All women over 12 years old should have a yearly mammogram. Many facilities now offer a "3D" mammogram, which may cost around $50 extra out of pocket. If possible,  we recommend you accept the option to have the 3D mammogram performed.  It both reduces the number of women who will be called back for extra views which then turn out to be normal, and it is better than the routine mammogram at detecting truly abnormal areas.    COLONOSCOPY:  Colonoscopy to  screen for colon cancer is recommended for all women at age 86.  We know, you hate the idea of the prep.  We agree, BUT, having colon cancer and not knowing it is worse!!  Colon cancer so often starts as a polyp that can be seen and removed at colonscopy, which can quite literally save your life!  And if your first colonoscopy is normal and you have no family history of colon cancer, most women don't have to have it again for 10 years.  Once every ten years, you can do something that may end up saving your life, right?  We will be happy to help you get it scheduled when you are ready.  Be sure to check your insurance coverage so you understand how much it will cost.  It may be covered as a preventative service at no cost, but you should check your particular policy.

## 2016-12-13 NOTE — Progress Notes (Signed)
Review of Systems   Constitutional: Negative.    HENT: Negative.    Eyes: Negative.    Respiratory: Negative.    Cardiovascular: Negative.    Gastrointestinal: Negative.    Endocrine: Negative.    Genitourinary: Negative.    Musculoskeletal: Negative.    Skin: Negative.    Allergic/Immunologic: Negative.    Neurological: Negative.    Psychiatric/Behavioral: Negative.

## 2016-12-18 ENCOUNTER — Telehealth: Payer: Self-pay | Admitting: Obstetrics and Gynecology

## 2016-12-18 NOTE — Telephone Encounter (Signed)
Call placed to patient to review benefits for an ultrasound. LMOM to convey information.

## 2016-12-25 ENCOUNTER — Telehealth: Payer: Self-pay | Admitting: Family Medicine

## 2016-12-25 MED ORDER — GLIMEPIRIDE 2 MG PO TABS: 2.0000 mg | ORAL_TABLET | Freq: Four times a day (QID) | ORAL | 6 refills | Status: DC

## 2016-12-25 NOTE — Telephone Encounter (Signed)
RX has been sent to the pharmacy and patient notified.

## 2016-12-25 NOTE — Telephone Encounter (Signed)
Patient calling to request refills on scripts that Dr. Juleen China has not prescribed before.  Glimepiride 2 mg tab Atorvastatin 40mg  tab   Ty,  -LL

## 2017-01-10 ENCOUNTER — Ambulatory Visit: Payer: Medicare Other | Admitting: *Deleted

## 2017-01-10 ENCOUNTER — Encounter: Payer: Self-pay | Admitting: Family Medicine

## 2017-01-10 ENCOUNTER — Ambulatory Visit (INDEPENDENT_AMBULATORY_CARE_PROVIDER_SITE_OTHER): Payer: Medicare Other | Admitting: Family Medicine

## 2017-01-10 VITALS — BP 124/78 | HR 94 | Temp 98.0°F | Ht 66.0 in | Wt 229.8 lb

## 2017-01-10 DIAGNOSIS — E119 Type 2 diabetes mellitus without complications: Secondary | ICD-10-CM

## 2017-01-10 DIAGNOSIS — F419 Anxiety disorder, unspecified: Secondary | ICD-10-CM | POA: Diagnosis not present

## 2017-01-10 DIAGNOSIS — Z23 Encounter for immunization: Secondary | ICD-10-CM

## 2017-01-10 DIAGNOSIS — I10 Essential (primary) hypertension: Secondary | ICD-10-CM | POA: Diagnosis not present

## 2017-01-10 LAB — POCT GLYCOSYLATED HEMOGLOBIN (HGB A1C): Hemoglobin A1C: 6.6

## 2017-01-10 MED ORDER — ALPRAZOLAM 0.5 MG PO TABS: 0.5000 mg | ORAL_TABLET | Freq: Every evening | ORAL | 2 refills | Status: DC | PRN

## 2017-01-10 NOTE — Progress Notes (Signed)
Brenda Griffin is a 71 y.o. female is here for follow up.  History of Present Illness:   Shaune Pascal CMA acting as scribe for Dr. Juleen China.  HPI: Patient comes in today for follow up on her diabetes.   Diabetes: Patient stated that she has not been taking her Jardiance for two months due to her being in the donut hole with her insurance. She stated that her numbers have not been that great at home with being off of the [place]. She has only been taking the Metformin and the Glimepiride. We will get an [facility] today.   Anxiety: Increased anxiety due to upcoming election. She is worried that Democrats will not take the House back.  AMW Visit: Next week with Cassie.  Health Maintenance Due  Topic Date Due  . Hepatitis C Screening  [birth year]  . FOOT EXAM  12/20/1955  . OPHTHALMOLOGY EXAM  12/20/1957  . TETANUS/TDAP  12/21/1966  . MAMMOGRAM  12/20/1997  . COLONOSCOPY  12/20/1997  . DEXA SCAN  12/20/2012  . PNA vac Low Risk Adult (1 of 2 - PCV13) 12/20/2012  . INFLUENZA VACCINE  10/18/2016   Depression screen PHQ 2/9 10/11/2016  Decreased Interest 0  Down, Depressed, Hopeless 0  PHQ - 2 Score 0   PMHx, SurgHx, SocialHx, FamHx, Medications, and Allergies were reviewed in the Visit Navigator and updated as appropriate.   Patient Active Problem List   Diagnosis Date Noted  . Hormone replacement therapy 10/11/2016  . Cluster headaches 10/11/2016  . Type 2 diabetes mellitus without complication, without long-term current use of insulin ([place]) 10/11/2016  . Hypothyroidism 10/11/2016  . Cough 10/11/2016  . Sore throat 10/11/2016  . Essential hypertension 10/11/2016  . History of skin cancer 10/11/2016  . Hyperlipidemia 10/11/2016  . Anxiety 10/11/2016  . Morbid obesity ([place]) 10/11/2016   Social History  Substance Use Topics  . Smoking status: Former Research scientist (life sciences)  . Smokeless tobacco: Never Used  . Alcohol use 0.6 - 1.2 oz/week    1 - 2 Glasses of wine per week   Current Medications  and Allergies:    .  acetaminophen (TYLENOL) 500 MG tablet, Take 500 mg by mouth as needed., Disp: , Rfl:  .  ALPRAZolam (XANAX) 0.5 MG tablet, Take 1 tablet (0.5 mg total) by mouth at bedtime as needed for anxiety., Disp: 30 tablet, Rfl: 2 .  atorvastatin (LIPITOR) 40 MG tablet, Take 1 tablet (40 mg total) by mouth daily., Disp: 90 tablet, Rfl: 3 .  Biotin 10000 MCG TABS, Take 1 tablet by mouth daily as needed., Disp: , Rfl:  .  Butalbital-APAP-Caffeine 50-300-40 MG CAPS, Take 1 tablet by mouth as needed., Disp: 84 capsule, Rfl: 0 .  diphenhydrAMINE (BENADRYL) 25 MG tablet, Take 25 mg by mouth as needed., Disp: , Rfl:  .  estradiol (ESTRACE) 1 MG tablet, Take 1 tablet (1 mg total) by mouth daily., Disp: 90 tablet, Rfl: 3 .  glimepiride (AMARYL) 2 MG tablet, Take 1 tablet (2 mg total) by mouth 4 (four) times daily., Disp: 120 tablet, Rfl: 6 .  levothyroxine (SYNTHROID, LEVOTHROID) 100 MCG tablet, Take 100 mcg by mouth daily before breakfast., Disp: , Rfl:  .  loperamide (IMODIUM A-D) 2 MG tablet, Take 2 mg by mouth as needed for diarrhea or loose stools., Disp: , Rfl:  .  losartan-hydrochlorothiazide (HYZAAR) 100-12.5 MG tablet, Take 1 tablet by mouth daily., Disp: , Rfl:  .  metFORMIN (GLUCOPHAGE-XR) 500 MG 24 hr tablet, Take 1 tablet (  500 mg total) by mouth 3 (three) times daily., Disp: 270 tablet, Rfl: 1 .  pantoprazole (PROTONIX) 40 MG tablet, Take 40 mg by mouth daily., Disp: , Rfl:  .  pioglitazone (ACTOS) 30 MG tablet, Take 1 tablet (30 mg total) by mouth daily., Disp: 90 tablet, Rfl: 1 .  senna (SENOKOT) 8.6 MG tablet, Take 1 tablet by mouth as needed for constipation., Disp: , Rfl:  .  zolpidem (AMBIEN CR) 6.25 MG CR tablet, Take 6.25 mg by mouth at bedtime as needed for sleep., Disp: , Rfl:  .  empagliflozin (JARDIANCE) 10 MG TABS tablet, Take 10 mg by mouth daily., Disp: , Rfl:    Allergies  Allergen Reactions  . Bacitracin Rash  . Benzoic Acid Rash  . Benzyl Salicylate Rash  .  Formaldehyde Rash  . Geraniol Rash  . Hydroxyethyl Methacrylate Rash  . Methyl Methacrylate [Methylmethacrylate] Rash  . Other Rash    Amyl Cinnamaldehyde  . Adhesive [Tape] Itching  . Aspirin Nausea And Vomiting  . Codeine Nausea And Vomiting  . Victoza [Liraglutide] Diarrhea  . Butanediol [Butylene Glycol] Rash   Review of Systems   Pertinent items are noted in the HPI. Otherwise, ROS is negative.  Vitals:   Vitals:   01/10/17 1004  BP: 124/78  Pulse: 94  Temp: 98 F (36.7 C)  TempSrc: Oral  SpO2: 98%  Weight: 229 lb 12.8 oz (104.2 kg)  Height: 5\' 6"  (1.676 m)     Body mass index is 37.09 kg/m.   Physical Exam:   Physical Exam  Constitutional: She appears well-nourished.  HENT:  Head: Normocephalic and atraumatic.  Eyes: Pupils are equal, round, and reactive to light. EOM are normal.  Neck: Normal range of motion. Neck supple.  Cardiovascular: Normal rate, regular rhythm, normal heart sounds and intact distal pulses.   Pulmonary/Chest: Effort normal.  Abdominal: Soft.  Skin: Skin is warm.  Psychiatric: She has a normal mood and affect. Her behavior is normal.  Nursing note and vitals reviewed.   Lab Results  Component Value Date   HGBA1C 6.6 01/10/2017   Assessment and Plan:   Diagnoses and all orders for this visit:  Type 2 diabetes mellitus without complication, without long-term current use of insulin ([place]) Comments: A1c okay. May be hiding highs and lows. Samples provided. Will set up with DM Educator and adjust medications over the next 3 months.  Orders: -     POCT glycosylated hemoglobin (Hb A1C)  Morbid obesity ([facility]) Comments: The patient is asked to make an attempt to improve diet and exercise patterns to aid in medical management of this problem.   Anxiety Comments: Refill today. Orders: -     ALPRAZolam (XANAX) 0.5 MG tablet; Take 1 tablet (0.5 mg total) by mouth at bedtime as needed for anxiety.  Essential  hypertension Comments: Stable.  Encounter for immunization -     Flu vaccine HIGH DOSE PF   . Reviewed expectations re: course of current medical issues. . Discussed self-management of symptoms. . Outlined signs and symptoms indicating need for more acute intervention. . Patient verbalized understanding and all questions were answered. Marland Kitchen Health Maintenance issues including appropriate healthy diet, exercise, and smoking avoidance were discussed with patient. . See orders for this visit as documented in the electronic medical record. . Patient received an After Visit Summary.  CMA served as Education administrator during this visit. History, Physical, and Plan performed by medical provider. The above documentation has been reviewed and is accurate and  complete. Briscoe Deutscher, D.O.  Briscoe Deutscher, DO [place] [place] 01/10/2017  Future Appointments Date Time Provider [place]  01/16/2017 2:00 PM Williemae Area, RN [facility] None  01/23/2017 11:00 AM Landis Martins, DPM [facility]  04/12/2017 10:00 AM Briscoe Deutscher, DO [facility] None

## 2017-01-15 ENCOUNTER — Other Ambulatory Visit: Payer: Self-pay | Admitting: *Deleted

## 2017-01-15 DIAGNOSIS — N9089 Other specified noninflammatory disorders of vulva and perineum: Secondary | ICD-10-CM

## 2017-01-15 NOTE — Progress Notes (Signed)
PCP notes:   Health maintenance: Hepatitis c: Believes this has been done. Foot exam: Has appt in November with podiatrist.  Opthalmology: Due in February  Tdap: Believes she is UTD. Mammogram: Due in January.  Colonoscopy: 2017 DEXA: 2017 PCV13: Up to date per pt.  RECORDS REQUESTED FROM Budd Palmer, MD in [place], [place].  Abnormal screenings: No.   Patient concerns: No.    Nurse concerns: No.   Next PCP appt: 04/12/2017

## 2017-01-15 NOTE — Progress Notes (Signed)
Subjective:   Brenda Griffin is a 71 y.o. female who presents for an Initial Medicare Annual Wellness Visit.  The Patient was informed that the wellness visit is to identify future health risk and educate and initiate measures that can reduce risk for increased disease through the lifespan.   Describes health as good.   Lives with husband in two story single family home but pt stays on first floor.   Review of Systems    No ROS.  Medicare Wellness Visit. Additional risk factors are reflected in the social history.   Cardiac Risk Factors include: advanced age (>68men, >59 women);diabetes mellitus;dyslipidemia;hypertension;obesity (BMI >30kg/m2)     Objective:    Today's Vitals   01/16/17 1440  BP: 110/72  Pulse: 68  Resp: 16  SpO2: 98%  Weight: 225 lb 12.8 oz (102.4 kg)  Height: 5\' 6"  (1.676 m)   Body mass index is 36.45 kg/m.   Current Medications (verified) Outpatient Encounter Prescriptions as of 01/16/2017  Medication Sig  . acetaminophen (TYLENOL) 500 MG tablet Take 500 mg by mouth as needed.  . ALPRAZolam (XANAX) 0.5 MG tablet Take 1 tablet (0.5 mg total) by mouth at bedtime as needed for anxiety.  Marland Kitchen atorvastatin (LIPITOR) 40 MG tablet Take 1 tablet (40 mg total) by mouth daily.  . Biotin 10000 MCG TABS Take 1 tablet by mouth daily as needed.  . Butalbital-APAP-Caffeine 50-300-40 MG CAPS Take 1 tablet by mouth as needed.  . diphenhydrAMINE (BENADRYL) 25 MG tablet Take 25 mg by mouth as needed.  . empagliflozin (JARDIANCE) 10 MG TABS tablet Take 10 mg by mouth daily.  Marland Kitchen estradiol (ESTRACE) 1 MG tablet Take 1 tablet (1 mg total) by mouth daily.  Marland Kitchen glimepiride (AMARYL) 2 MG tablet Take 1 tablet (2 mg total) by mouth 4 (four) times daily.  Marland Kitchen levothyroxine (SYNTHROID, LEVOTHROID) 100 MCG tablet Take 100 mcg by mouth daily before breakfast.  . loperamide (IMODIUM A-D) 2 MG tablet Take 2 mg by mouth as needed for diarrhea or loose stools.  Marland Kitchen losartan-hydrochlorothiazide  (HYZAAR) 100-12.5 MG tablet Take 1 tablet by mouth daily.  . metFORMIN (GLUCOPHAGE-XR) 500 MG 24 hr tablet Take 1 tablet (500 mg total) by mouth 3 (three) times daily.  . pantoprazole (PROTONIX) 40 MG tablet Take 40 mg by mouth daily.  . pioglitazone (ACTOS) 30 MG tablet Take 1 tablet (30 mg total) by mouth daily.  Marland Kitchen senna (SENOKOT) 8.6 MG tablet Take 1 tablet by mouth as needed for constipation.  Marland Kitchen zolpidem (AMBIEN CR) 6.25 MG CR tablet Take 6.25 mg by mouth at bedtime as needed for sleep.   No facility-administered encounter medications on file as of 01/16/2017.     Allergies (verified) Bacitracin; Benzoic acid; Benzyl salicylate; Formaldehyde; Geraniol; Hydroxyethyl methacrylate; Methyl methacrylate [methylmethacrylate]; Other; Adhesive [tape]; Aspirin; Codeine; Victoza [liraglutide]; and Butanediol [butylene glycol]   History: Past Medical History:  Diagnosis Date  . Anxiety   . Cancer ([place])    skin  . Cataract   . Diabetes mellitus without complication ([place])   . Fibroid   . GERD (gastroesophageal reflux disease)   . Heart murmur   . Hypercholesterolemia   . Hypertension   . Infertility, female   . Thyroid disease    Past Surgical History:  Procedure Laterality Date  . ABDOMINAL HYSTERECTOMY    . BUNIONECTOMY  1997  . CERVICAL CONE BIOPSY  1976  . EYE SURGERY    . GALLBLADDER SURGERY    . MOHS SURGERY  2005/2007  . OOPHORECTOMY    . PILONIDAL CYST EXCISION  1975  . SPINE SURGERY    . TONSILLECTOMY  1957   Family History  Problem Relation Age of Onset  . Stroke Mother   . Heart disease Father   . Hyperlipidemia Father   . Hypertension Father   . Hypertension Sister   . Heart disease Paternal Grandmother   . Hypertension Paternal Grandmother   . Diabetes Brother   . Hypertension Brother   . Diabetes Brother    Social History   Occupational History  . Not on file.   Social History Main Topics  . Smoking status: Former Research scientist (life sciences)  . Smokeless tobacco: Never  Used  . Alcohol use 0.6 - 1.2 oz/week    1 - 2 Glasses of wine per week  . Drug use: No  . Sexual activity: Yes    Partners: Male    Birth control/ protection: Post-menopausal    Tobacco Counseling Counseling given: Not Answered   Activities of Daily Living In your present state of health, do you have any difficulty performing the following activities: 01/16/2017  Hearing? N  Vision? N  Difficulty concentrating or making decisions? N  Walking or climbing stairs? N  Dressing or bathing? N  Doing errands, shopping? N  Preparing Food and eating ? N  Using the Toilet? N  In the past six months, have you accidently leaked urine? Y  Comment Pressure incontinence.   Do you have problems with loss of bowel control? N  Managing your Medications? N  Managing your Finances? N  Housekeeping or managing your Housekeeping? N    Immunizations and Health Maintenance Immunization History  Administered Date(s) Administered  . Influenza, High Dose Seasonal PF 01/10/2017   Health Maintenance Due  Topic Date Due  . Hepatitis C Screening  1945-03-29  . FOOT EXAM  12/20/1957  . OPHTHALMOLOGY EXAM  12/20/1957  . TETANUS/TDAP  12/21/1966  . MAMMOGRAM  12/20/1997  . COLONOSCOPY  12/20/1997  . DEXA SCAN  12/20/2012  . PNA vac Low Risk Adult (1 of 2 - PCV13) 12/20/2012    Patient Care Team: Briscoe Deutscher, DO as PCP - General (Family Medicine)  Indicate any recent Medical Services you may have received from other than [facility] providers in the past year (date may be approximate).     Assessment:   This is a routine wellness examination for [facility]. Physical assessment deferred to PCP.  Hearing/Vision screen Hearing Screening Comments: Able to hear conversational tones w/o difficulty. No issues reported.  Passed whisper test. Vision Screening Comments: Cataracts removed 2 years ago. 04/2016 in [place].   Dietary issues and exercise activities discussed: Current Exercise Habits: The patient  does not participate in regular exercise at present, Exercise limited by: None identified  Goals    . Walk 3 times per week.      Depression Screen PHQ 2/9 Scores 01/16/2017 10/11/2016  PHQ - 2 Score 1 0    Fall Risk Fall Risk  01/16/2017 10/11/2016  Falls in the past year? No No    Cognitive Function: Ad8 score reviewed for issues:  Issues making decisions:no  Less interest in hobbies / activities:no  Repeats questions, stories (family complaining):no  Trouble using ordinary gadgets (microwave, computer, phone):no  Forgets the month or year: no  Mismanaging finances: no  Remembering appts:no  Daily problems with thinking and/or memory:no Ad8 score is=0 Pt states that she does crossword puzzles and a book a day.  Screening Tests Health Maintenance  Topic Date Due  . Hepatitis C Screening  March 26, 1945  . FOOT EXAM  12/20/1957  . OPHTHALMOLOGY EXAM  12/20/1957  . TETANUS/TDAP  12/21/1966  . MAMMOGRAM  12/20/1997  . COLONOSCOPY  12/20/1997  . DEXA SCAN  12/20/2012  . PNA vac Low Risk Adult (1 of 2 - PCV13) 12/20/2012  . HEMOGLOBIN A1C  07/11/2017  . INFLUENZA VACCINE  Completed      Plan:   Follow up with PCP as directed.  I have personally reviewed and noted the following in the patient's chart:   . Medical and social history . Use of alcohol, tobacco or illicit drugs  . Current medications and supplements . Functional ability and status . Nutritional status . Physical activity . Advanced directives . List of other physicians . Vitals . Screenings to include cognitive, depression, and falls . Referrals and appointments  In addition, I have reviewed and discussed with patient certain preventive protocols, quality metrics, and best practice recommendations. A written personalized care plan for preventive services as well as general preventive health recommendations were provided to patient.     Williemae Area, RN   01/16/2017

## 2017-01-15 NOTE — Progress Notes (Signed)
Pre visit review using our clinic review tool, if applicable. No additional management support is needed unless otherwise documented below in the visit note. 

## 2017-01-16 ENCOUNTER — Encounter: Payer: Self-pay | Admitting: *Deleted

## 2017-01-16 ENCOUNTER — Ambulatory Visit (INDEPENDENT_AMBULATORY_CARE_PROVIDER_SITE_OTHER): Payer: Medicare Other | Admitting: *Deleted

## 2017-01-16 VITALS — BP 110/72 | HR 68 | Resp 16 | Ht 66.0 in | Wt 225.8 lb

## 2017-01-16 DIAGNOSIS — Z0000 Encounter for general adult medical examination without abnormal findings: Secondary | ICD-10-CM | POA: Diagnosis not present

## 2017-01-16 NOTE — Patient Instructions (Signed)
Ms. Brenda Griffin , Thank you for taking time to come for your Medicare Wellness Visit. I appreciate your ongoing commitment to your health goals. Please review the following plan we discussed and let me know if I can assist you in the future.   These are the goals we discussed: Goals    None      This is a list of the screening recommended for you and due dates:  Health Maintenance  Topic Date Due  .  Hepatitis C: One time screening is recommended by Center for Disease Control  (CDC) for  adults born from [birth year] through 1963.   1945/05/28  . Complete foot exam   12/20/1957  . Eye exam for diabetics  12/20/1957  . Tetanus Vaccine  12/21/1966  . Mammogram  12/20/1997  . Colon Cancer Screening  12/20/1997  . DEXA scan (bone density measurement)  12/20/2012  . Pneumonia vaccines (1 of 2 - PCV13) 12/20/2012  . Hemoglobin A1C  07/11/2017  . Flu Shot  Completed   Preventive Care for Adults  A healthy lifestyle and preventive care can promote health and wellness. Preventive health guidelines for adults include the following key practices.  . A routine yearly physical is a good way to check with your health care provider about your health and preventive screening. It is a chance to share any concerns and updates on your health and to receive a thorough exam.  . Visit your dentist for a routine exam and preventive care every 6 months. Brush your teeth twice a day and floss once a day. Good oral hygiene prevents tooth decay and gum disease.  . The frequency of eye exams is based on your age, health, family medical history, use  of contact lenses, and other factors. Follow your health care provider's recommendations for frequency of eye exams.  . Eat a healthy diet. Foods like vegetables, fruits, whole grains, low-fat dairy products, and lean protein foods contain the nutrients you need without too many calories. Decrease your intake of foods high in solid fats, added sugars, and salt. Eat the right  amount of calories for you. Get information about a proper diet from your health care provider, if necessary.  . Regular physical exercise is one of the most important things you can do for your health. Most adults should get at least 150 minutes of moderate-intensity exercise (any activity that increases your heart rate and causes you to sweat) each week. In addition, most adults need muscle-strengthening exercises on 2 or more days a week.  Silver Sneakers may be a benefit available to you. To determine eligibility, you may visit the website: www.silversneakers.com or contact program at (864)115-2098 Mon-Fri between 8AM-8PM.   . Maintain a healthy weight. The body mass index (BMI) is a screening tool to identify possible weight problems. It provides an estimate of body fat based on height and weight. Your health care provider can find your BMI and can help you achieve or maintain a healthy weight.   For adults 20 years and older: ? A BMI below 18.5 is considered underweight. ? A BMI of 18.5 to 24.9 is normal. ? A BMI of 25 to 29.9 is considered overweight. ? A BMI of 30 and above is considered obese.   . Maintain normal blood lipids and cholesterol levels by exercising and minimizing your intake of saturated fat. Eat a balanced diet with plenty of fruit and vegetables. Blood tests for lipids and cholesterol should begin at age 61 and be repeated  every 5 years. If your lipid or cholesterol levels are high, you are over 50, or you are at high risk for heart disease, you may need your cholesterol levels checked more frequently. Ongoing high lipid and cholesterol levels should be treated with medicines if diet and exercise are not working.  . If you smoke, find out from your health care provider how to quit. If you do not use tobacco, please do not start.  . If you choose to drink alcohol, please do not consume more than 2 drinks per day. One drink is considered to be 12 ounces (355 mL) of beer, 5  ounces (148 mL) of wine, or 1.5 ounces (44 mL) of liquor.  . If you are 65-97 years old, ask your health care provider if you should take aspirin to prevent strokes.  . Use sunscreen. Apply sunscreen liberally and repeatedly throughout the day. You should seek shade when your shadow is shorter than you. Protect yourself by wearing long sleeves, pants, a wide-brimmed hat, and sunglasses year round, whenever you are outdoors.  . Once a month, do a whole body skin exam, using a mirror to look at the skin on your back. Tell your health care provider of new moles, moles that have irregular borders, moles that are larger than a pencil eraser, or moles that have changed in shape or color.

## 2017-01-16 NOTE — Progress Notes (Signed)
I have personally reviewed the Medicare Annual Wellness questionnaire and have noted 1. The patient's medical and social history 2. Their use of alcohol, tobacco or illicit drugs 3. Their current medications and supplements 4. The patient's functional ability including ADL's, fall risks, home safety risks and hearing or visual impairment. 5. Diet and physical activities 6. Evidence for depression or mood disorders 7. Reviewed Updated provider list, see scanned forms and [facility] Snapshot.   The patients weight, height, BMI and visual acuity have been recorded in the chart I have made referrals, counseling and provided education to the patient based review of the above and I have provided the pt with a written personalized care plan for preventive services.  I have provided the patient with a copy of your personalized plan for preventive services. Instructed to take the time to review along with their updated medication list.  Brenda Griffin  

## 2017-01-18 ENCOUNTER — Encounter: Payer: Self-pay | Admitting: Obstetrics and Gynecology

## 2017-01-18 ENCOUNTER — Ambulatory Visit (INDEPENDENT_AMBULATORY_CARE_PROVIDER_SITE_OTHER): Payer: Medicare Other | Admitting: Obstetrics and Gynecology

## 2017-01-18 DIAGNOSIS — N9089 Other specified noninflammatory disorders of vulva and perineum: Secondary | ICD-10-CM | POA: Diagnosis not present

## 2017-01-18 NOTE — Progress Notes (Signed)
GYNECOLOGY  VISIT   HPI: 71 y.o.   Married  Caucasian  female   [id number] with No LMP recorded. Patient is postmenopausal.   here for vulvar BX, the patient was noted to have whitening on the inner right labia minora at her annual exam and is here for a biopsy.       GYNECOLOGIC HISTORY: No LMP recorded. Patient is postmenopausal. Contraception:postmenopause Menopausal hormone therapy: none         OB History    Gravida Para Term Preterm AB Living   0 0 0 0 0 0   SAB TAB Ectopic Multiple Live Births   0 0 0 0 0         Patient Active Problem List   Diagnosis Date Noted  . Hormone replacement therapy 10/11/2016  . Cluster headaches 10/11/2016  . Type 2 diabetes mellitus without complication, without long-term current use of insulin ([place]) 10/11/2016  . Hypothyroidism 10/11/2016  . Cough 10/11/2016  . Sore throat 10/11/2016  . Essential hypertension 10/11/2016  . History of skin cancer 10/11/2016  . Hyperlipidemia 10/11/2016  . Anxiety 10/11/2016  . Morbid obesity ([place]) 10/11/2016    Past Medical History:  Diagnosis Date  . Anxiety   . Cancer ([place])    skin  . Cataract   . Diabetes mellitus without complication ([place])   . Fibroid   . GERD (gastroesophageal reflux disease)   . Heart murmur   . Hypercholesterolemia   . Hypertension   . Infertility, female   . Thyroid disease     Past Surgical History:  Procedure Laterality Date  . ABDOMINAL HYSTERECTOMY    . BUNIONECTOMY  1997  . CERVICAL CONE BIOPSY  1976  . EYE SURGERY    . GALLBLADDER SURGERY    . MOHS SURGERY  2005/2007  . OOPHORECTOMY    . PILONIDAL CYST EXCISION  1975  . SPINE SURGERY    . TONSILLECTOMY  1957    Current Outpatient Prescriptions  Medication Sig Dispense Refill  . acetaminophen (TYLENOL) 500 MG tablet Take 500 mg by mouth as needed.    . ALPRAZolam (XANAX) 0.5 MG tablet Take 1 tablet (0.5 mg total) by mouth at bedtime as needed for anxiety. 30 tablet 2  . atorvastatin (LIPITOR) 40 MG  tablet Take 1 tablet (40 mg total) by mouth daily. 90 tablet 3  . Biotin 10000 MCG TABS Take 1 tablet by mouth daily as needed.    . Butalbital-APAP-Caffeine 50-300-40 MG CAPS Take 1 tablet by mouth as needed. 84 capsule 0  . diphenhydrAMINE (BENADRYL) 25 MG tablet Take 25 mg by mouth as needed.    . empagliflozin (JARDIANCE) 10 MG TABS tablet Take 10 mg by mouth daily.    Marland Kitchen estradiol (ESTRACE) 1 MG tablet Take 1 tablet (1 mg total) by mouth daily. 90 tablet 3  . glimepiride (AMARYL) 2 MG tablet Take 1 tablet (2 mg total) by mouth 4 (four) times daily. 120 tablet 6  . levothyroxine (SYNTHROID, LEVOTHROID) 100 MCG tablet Take 100 mcg by mouth daily before breakfast.    . loperamide (IMODIUM A-D) 2 MG tablet Take 2 mg by mouth as needed for diarrhea or loose stools.    Marland Kitchen losartan-hydrochlorothiazide (HYZAAR) 100-12.5 MG tablet Take 1 tablet by mouth daily.    . metFORMIN (GLUCOPHAGE-XR) 500 MG 24 hr tablet Take 1 tablet (500 mg total) by mouth 3 (three) times daily. 270 tablet 1  . pantoprazole (PROTONIX) 40 MG tablet Take 40 mg by  mouth daily.    . pioglitazone (ACTOS) 30 MG tablet Take 1 tablet (30 mg total) by mouth daily. 90 tablet 1  . senna (SENOKOT) 8.6 MG tablet Take 1 tablet by mouth as needed for constipation.    Marland Kitchen zolpidem (AMBIEN CR) 6.25 MG CR tablet Take 6.25 mg by mouth at bedtime as needed for sleep.     No current facility-administered medications for this visit.      ALLERGIES: Bacitracin; Benzoic acid; Benzyl salicylate; Formaldehyde; Geraniol; Hydroxyethyl methacrylate; Methyl methacrylate [methylmethacrylate]; Other; Adhesive [tape]; Aspirin; Codeine; Victoza [liraglutide]; and Butanediol [butylene glycol]  Family History  Problem Relation Age of Onset  . Stroke Mother   . Heart disease Father   . Hyperlipidemia Father   . Hypertension Father   . Hypertension Sister   . Heart disease Paternal Grandmother   . Hypertension Paternal Grandmother   . Diabetes Brother   .  Hypertension Brother   . Diabetes Brother     Social History   Social History  . Marital status: Married    Spouse name: N/A  . Number of children: N/A  . Years of education: N/A   Occupational History  . Not on file.   Social History Main Topics  . Smoking status: Former Research scientist (life sciences)  . Smokeless tobacco: Never Used  . Alcohol use 0.6 - 1.2 oz/week    1 - 2 Glasses of wine per week  . Drug use: No  . Sexual activity: Yes    Partners: Male    Birth control/ protection: Post-menopausal   Other Topics Concern  . Not on file   Social History Narrative  . No narrative on file    Review of Systems  Constitutional: Negative.   HENT: Negative.   Eyes: Negative.   Respiratory: Negative.   Cardiovascular: Negative.   Gastrointestinal: Negative.   Genitourinary: Negative.   Musculoskeletal: Negative.   Skin: Negative.   Neurological: Negative.   Endo/Heme/Allergies: Negative.   Psychiatric/Behavioral: Negative.     PHYSICAL EXAMINATION:    BP 128/78 (BP Location: Right Arm, Patient Position: Sitting, Cuff Size: Large)   Pulse 100   Resp 16   Wt 227 lb (103 kg)   BMI 36.64 kg/m     General appearance: alert, cooperative and appears stated age   Pelvic: External genitalia:  Whitening in right inner labia minora              Urethra:  normal appearing urethra with no masses, tenderness or lesions               Chaperone was present for exam.  ASSESSMENT Vulvar lesion    PLAN Biopsy done F/U depending on results   An After Visit Summary was printed and given to the patient.

## 2017-01-23 ENCOUNTER — Ambulatory Visit: Payer: Medicare Other | Admitting: Sports Medicine

## 2017-01-23 ENCOUNTER — Other Ambulatory Visit: Payer: Self-pay | Admitting: Sports Medicine

## 2017-01-23 ENCOUNTER — Ambulatory Visit (INDEPENDENT_AMBULATORY_CARE_PROVIDER_SITE_OTHER): Payer: Medicare Other

## 2017-01-23 ENCOUNTER — Encounter: Payer: Self-pay | Admitting: Sports Medicine

## 2017-01-23 VITALS — BP 124/85 | HR 87 | Resp 16

## 2017-01-23 DIAGNOSIS — M79672 Pain in left foot: Secondary | ICD-10-CM | POA: Diagnosis not present

## 2017-01-23 DIAGNOSIS — E1142 Type 2 diabetes mellitus with diabetic polyneuropathy: Secondary | ICD-10-CM | POA: Diagnosis not present

## 2017-01-23 DIAGNOSIS — M79671 Pain in right foot: Secondary | ICD-10-CM

## 2017-01-23 NOTE — Progress Notes (Signed)
   Subjective:    Patient ID: Brenda Griffin, female    DOB: May 05, 1945, 71 y.o.   MRN: [id number]  HPI  71 y/o Diabetic female presents for worsening foot pain at balls that radiates to toes as burning and numbness. Patient has not tried any treatment. Was told 2 years ago she had neuropathy but did not have any treatment. Reports that she feels like she still has a shoe on all the time. States that she has tried shoe with metatarsal padding that has helped. But otherwise symptoms are aggrvating. Denies any injury or trauma. No other issues.    Review of Systems  All other systems reviewed and are negative.      Objective:   Physical Exam   Recent Results (from the past 2160 hour(s))  POCT glycosylated hemoglobin (Hb A1C)     Status: None   Collection Time: 01/10/17 10:22 AM  Result Value Ref Range   Hemoglobin A1C 6.6     Objective: General: Patient is awake, alert, and oriented x 3 and in no acute distress.  Integument: Skin is warm, dry and supple bilateral. Nails are short and dystrophic. No signs of infection. No open lesions or preulcerative lesions present bilateral. Remaining integument unremarkable.  Vasculature:  Dorsalis Pedis pulse 1/4 bilateral. Posterior Tibial pulse  1/4 bilateral.  Capillary fill time <3 sec 1-5 bilateral. Scant hair growth to the level of the digits. Temperature gradient within normal limits. No varicosities present bilateral. No edema present bilateral.   Neurology: The patient has diminished sensation measured with a 5.07/10g Semmes Weinstein Monofilament at all pedal sites bilateral. Vibratory sensation diminished bilateral with tuning fork. No Babinski sign present bilateral.   Musculoskeletal: Asymptomatic hammertoe and right bunion pedal deformities noted bilateral. Muscular strength 5/5 in all lower extremity muscular groups bilateral without pain on range of motion . No tenderness with calf compression bilateral.  Xray- Left hardware  intact, Bilateral varus hammertoes, mild bunion on right, no other acute findings.   Assessment and Plan: Problem List Items Addressed This Visit    None    Visit Diagnoses    Foot pain, bilateral    -  Primary   Relevant Orders   DG Foot Complete Left (Completed)   Diabetic polyneuropathy associated with type 2 diabetes mellitus ([place])             Assessment & Plan:   Problem List Items Addressed This Visit    None    Visit Diagnoses    Foot pain, bilateral    -  Primary   Relevant Orders   DG Foot Complete Left (Completed)   Diabetic polyneuropathy associated with type 2 diabetes mellitus ([place])          -Examined patient. -Discussed and educated patient on diabetic foot care, especially with  regards to the vascular, neurological and musculoskeletal systems.  -Stressed the importance of good glycemic control and the detriment of not  controlling glucose levels in relation to the foot. -Recommend topical aspercreme or capsacin and vit b complex; if neuropathy symptoms are not better advised patient to call for referal to neurology  -Answered all patient questions -Patient to return as needed  -Patient advised to call the office if any problems or questions arise in the meantime.  Landis Martins, DPM

## 2017-01-23 NOTE — Patient Instructions (Signed)
Diabetic Neuropathy Diabetic neuropathy is a nerve disease or nerve damage that is caused by diabetes mellitus. About half of all people with diabetes mellitus have some form of nerve damage. Nerve damage is more common in those who have had diabetes mellitus for many years and who generally have not had good control of their blood sugar (glucose) level. Diabetic neuropathy is a common complication of diabetes mellitus. There are three common types of diabetic neuropathy and a fourth type that is less common and less understood:  Peripheral neuropathy-This is the most common type of diabetic neuropathy. It causes damage to the nerves of the feet and legs first and then eventually the hands and arms. The damage affects the ability to sense touch.  Autonomic neuropathy-This type causes damage to the autonomic nervous system, which controls the following functions: ? Heartbeat. ? Body temperature. ? Blood pressure. ? Urination. ? Digestion. ? Sweating. ? Sexual function.  Focal neuropathy-Focal neuropathy can be painful and unpredictable and occurs most often in older adults with diabetes mellitus. It involves a specific nerve or one area and often comes on suddenly. It usually does not cause long-term problems.  Radiculoplexus neuropathy- Sometimes called lumbosacral radiculoplexus neuropathy, radiculoplexus neuropathy affects the nerves of the thighs, hips, buttocks, or legs. It is more common in people with type 2 diabetes mellitus and in older men. It is characterized by debilitating pain, weakness, and atrophy, usually in the thigh muscles.  What are the causes? The cause of peripheral, autonomic, and focal neuropathies is diabetes mellitus that is uncontrolled and high glucose levels. The cause of radiculoplexus neuropathy is unknown. However, it is thought to be caused by inflammation related to uncontrolled glucose levels. What are the signs or symptoms? Peripheral Neuropathy Peripheral  neuropathy develops slowly over time. When the nerves of the feet and legs no longer work there may be:  Burning, stabbing, or aching pain in the legs or feet.  Inability to feel pressure or pain in your feet. This can lead to: ? Thick calluses over pressure areas. ? Pressure sores. ? Ulcers.  Foot deformities.  Reduced ability to feel temperature changes.  Muscle weakness.  Autonomic Neuropathy The symptoms of autonomic neuropathy vary depending on which nerves are affected. Symptoms may include:  Problems with digestion, such as: ? Feeling sick to your stomach (nausea). ? Vomiting. ? Bloating. ? Constipation. ? Diarrhea. ? Abdominal pain.  Difficulty with urination. This occurs if you lose your ability to sense when your bladder is full. Problems include: ? Urine leakage (incontinence). ? Inability to empty your bladder completely (retention).  Rapid or irregular heartbeat (palpitations).  Blood pressure drops when you stand up (orthostatic hypotension). When you stand up you may feel: ? Dizzy. ? Weak. ? Faint.  In men, inability to attain and maintain an erection.  In women, vaginal dryness and problems with decreased sexual desire and arousal.  Problems with body temperature regulation.  Increased or decreased sweating.  Focal Neuropathy  Abnormal eye movements or abnormal alignment of both eyes.  Weakness in the wrist.  Foot drop. This results in an inability to lift the foot properly and abnormal walking or foot movement.  Paralysis on one side of your face (Bell palsy).  Chest or abdominal pain. Radiculoplexus Neuropathy  Sudden, severe pain in your hip, thigh, or buttocks.  Weakness and wasting of thigh muscles.  Difficulty rising from a seated position.  Abdominal swelling.  Unexplained weight loss (usually more than 10 lb [4.5 kg]). How is   this diagnosed? Peripheral Neuropathy Your senses may be tested. Sensory function testing can be  done with:  A light touch using a monofilament.  A vibration with tuning fork.  A sharp sensation with a pin prick.  Other tests that can help diagnose neuropathy are:  Nerve conduction velocity. This test checks the transmission of an electrical current through a nerve.  Electromyography. This shows how muscles respond to electrical signals transmitted by nearby nerves.  Quantitative sensory testing. This is used to assess how your nerves respond to vibrations and changes in temperature.  Autonomic Neuropathy Diagnosis is often based on reported symptoms. Tell your health care provider if you experience:  Dizziness.  Constipation.  Diarrhea.  Inappropriate urination or inability to urinate.  Inability to get or maintain an erection.  Tests that may be done include:  Electrocardiography or Holter monitor. These are tests that can help show problems with the heart rate or heart rhythm.  An X-ray exam may be done.  Focal Neuropathy Diagnosis is made based on your symptoms and what your health care provider finds during your exam. Other tests may be done. They may include:  Nerve conduction velocities. This checks the transmission of electrical current through a nerve.  Electromyography. This shows how muscles respond to electrical signals transmitted by nearby nerves.  Quantitative sensory testing. This test is used to assess how your nerves respond to vibration and changes in temperature.  Radiculoplexus Neuropathy  Often the first thing is to eliminate any other issue or problems that might be the cause, as there is no standard test for diagnosis.  X-ray exam of your spine and lumbar region.  Spinal tap to rule out cancer.  MRI to rule out other lesions. How is this treated? Once nerve damage occurs, it cannot be reversed. The goal of treatment is to keep the disease or nerve damage from getting worse and affecting more nerve fibers. Controlling your blood  glucose level is the key. Most people with radiculoplexus neuropathy see at least a partial improvement over time. You will need to keep your blood glucose and HbA1c levels in the target range determined by your health care provider. Things that help control blood glucose levels include:  Blood glucose monitoring.  Meal planning.  Physical activity.  Diabetes medicine.  Over time, maintaining lower blood glucose levels helps lessen symptoms. Sometimes, prescription pain medicine is needed. Follow these instructions at home:  Do not smoke.  Keep your blood glucose level in the range that you and your health care provider have determined acceptable for you.  Keep your blood pressure level in the range that you and your health care provider have determined acceptable for you.  Eat a well-balanced diet.  Be physically active every day. Include strength training and balance exercises.  Protect your feet. ? Check your feet every day for sores, cuts, blisters, or signs of infection. ? Wear padded socks and supportive shoes. Use orthotic inserts, if necessary. ? Regularly check the insides of your shoes for worn spots. Make sure there are no rocks or other items inside your shoes before you put them on. Contact a health care provider if:  You have burning, stabbing, or aching pain in the legs or feet.  You are unable to feel pressure or pain in your feet.  You develop problems with digestion such as: ? Nausea. ? Vomiting. ? Bloating. ? Constipation. ? Diarrhea. ? Abdominal pain.  You have difficulty with urination, such as: ? Incontinence. ? Retention.    You have palpitations.  You develop orthostatic hypotension. When you stand up you may feel: ? Dizzy. ? Weak. ? Faint.  You cannot attain and maintain an erection (in men).  You have vaginal dryness and problems with decreased sexual desire and arousal (in women).  You have severe pain in your thighs, legs, or  buttocks.  You have unexplained weight loss. This information is not intended to replace advice given to you by your health care provider. Make sure you discuss any questions you have with your health care provider. Document Released: 05/15/2001 Document Revised: 08/12/2015 Document Reviewed: 08/15/2012 Elsevier Interactive Patient Education  2017 [employer].  

## 2017-02-20 ENCOUNTER — Telehealth: Payer: Self-pay | Admitting: Sports Medicine

## 2017-02-20 ENCOUNTER — Other Ambulatory Visit: Payer: Self-pay

## 2017-02-20 DIAGNOSIS — E1142 Type 2 diabetes mellitus with diabetic polyneuropathy: Secondary | ICD-10-CM

## 2017-02-20 DIAGNOSIS — M79672 Pain in left foot: Secondary | ICD-10-CM

## 2017-02-20 DIAGNOSIS — M79671 Pain in right foot: Secondary | ICD-10-CM

## 2017-02-20 NOTE — Telephone Encounter (Signed)
Dr Cannon Kettle, gabapentin or referral to neurology?

## 2017-02-20 NOTE — Progress Notes (Signed)
Referral to [facility] faxed [phone]

## 2017-02-20 NOTE — Telephone Encounter (Signed)
I'm a pt of Dr. Leeanne Rio and I suffer from diabetic foot pain. She suggested I try the Aspercreme or the capscain but I could not take either as I'm allergic to some of the ingredients. I tried some something with a little bit of lidocaine in it but it did not really help. She said there is a pill I can take so I was wanting to know if she could send that to my pharmacy. I use the [facility] which should be on my file. My best call back number is [phone]. Thank you.

## 2017-02-20 NOTE — Telephone Encounter (Signed)
Neurology consult. I am hesistent to put her on any medications with her allergies until neurology sees her.

## 2017-03-09 ENCOUNTER — Ambulatory Visit: Payer: Medicare Other | Admitting: Podiatry

## 2017-03-09 ENCOUNTER — Encounter: Payer: Self-pay | Admitting: Podiatry

## 2017-03-09 DIAGNOSIS — L600 Ingrowing nail: Secondary | ICD-10-CM | POA: Diagnosis not present

## 2017-03-09 NOTE — Patient Instructions (Signed)

## 2017-03-18 NOTE — Progress Notes (Signed)
  Subjective:  Patient ID: Brenda Griffin, female    DOB: Mar 26, 1945,  MRN: [id number]  Chief Complaint  Patient presents with  . Nail Problem    Left foot; great toe-lateral side; pt stated, "I want to have my nail checked today to see if I have an ingrown toenail; toe feels sensitive"; x1 week; Pt Diabetic Type 2; Sugar=did not check today; A1C=6.8 (10/11/16)   71 y.o. female returns for the above complaint.  Ports ingrown nail to the left great toe endorses diabetes but well controlled with last A1c of 6.8  Objective:  There were no vitals filed for this visit. General AA&O x3. Normal mood and affect.  Vascular Dorsalis pedis and posterior tibial pulses  present 2+ bilaterally  Capillary refill normal to all digits. Pedal hair growth normal.  Neurologic Epicritic sensation present bilaterally.  Dermatologic No open lesions. Interspaces clear of maceration.  Normal skin temperature and turgor. Painful ingrowing nail at lateral nail borders of the hallux nail left.  Orthopedic: MMT 5/5 in dorsiflexion, plantarflexion, inversion, and eversion. Normal lower extremity joint ROM without pain or crepitus.   Assessment & Plan:  Patient was evaluated and treated and all questions answered.  Ingrown Nail, left -Patient elects to proceed with ingrown toenail removal today -Ingrown nail excised. See procedure note. -Educated on post-procedure care including soaking. Written instructions provided. -Patient to follow up in 2 weeks for nail check.  Procedure: Excision of Ingrown Toenail Location: Left 1st toe lateral nail borders. Anesthesia: Lidocaine 1% plain; 1.35mL and Marcaine 0.5% plain; 1.98mL, digital block. Skin Prep: Alcohol. Dressing: Silvadene; telfa; dry, sterile, compression dressing. Technique: Following skin prep, the toe was exsanguinated and a tourniquet was secured at the base of the toe. The affected nail border was freed, split with a nail splitter, and excised. Chemical  matrixectomy was then performed with phenol and irrigated out with alcohol. The tourniquet was then removed and sterile dressing applied. Disposition: Patient tolerated procedure well. Patient to return in 2 weeks for follow-up.    Return in about 2 weeks (around 03/23/2017) for 2 week f/u ingrown.

## 2017-03-23 ENCOUNTER — Ambulatory Visit: Payer: Medicare Other | Admitting: Podiatry

## 2017-03-23 DIAGNOSIS — L600 Ingrowing nail: Secondary | ICD-10-CM

## 2017-03-23 NOTE — Progress Notes (Signed)
  Subjective:  Patient ID: Brenda Griffin, female    DOB: 31-Jan-1946,  MRN: [id number]  Chief Complaint  Patient presents with  . Ingrown Toenail    2 week follow up after ingrown toenail removal   72 y.o. female returns for the above complaint.  Denies pain or drainage from the toenail.  Objective:   General AA&O x3. Normal mood and affect.  Vascular Foot warm and well perfused with good capillary refill.  Neurologic Sensation grossly intact.  Dermatologic Nail avulsion site healing well without drainage or erythema. Nail bed with overlying soft crust. Left intact. No signs of local infection.  Orthopedic: No tenderness to palpation of the toe.   Assessment & Plan:  Patient was evaluated and treated and all questions answered.  S/p Ingrown Toenail Excision, left -Healing well without issue. -Discussed return precautions. -F/u PRN

## 2017-03-26 ENCOUNTER — Telehealth: Payer: Self-pay | Admitting: Family Medicine

## 2017-03-26 MED ORDER — EMPAGLIFLOZIN 10 MG PO TABS: 10.0000 mg | ORAL_TABLET | Freq: Every day | ORAL | 2 refills | Status: DC

## 2017-03-26 NOTE — Telephone Encounter (Signed)
Copied from [place]. Topic: Quick Communication - Rx Refill/Question >> Mar 26, 2017  9:42 AM Patrice Paradise wrote: Has the patient contacted their pharmacy? Yes.   empagliflozin (JARDIANCE) 10 MG TABS tablet  (Agent: If no, request that the patient contact the pharmacy for the refill.) Preferred Pharmacy (with phone number or street name):  [facility], [place] - [address] [place] [address] [place] [address] [address] [place]  [address] Phone: [phone] Fax: [phone]    Agent: Please be advised that RX refills may take up to 3 business days. We ask that you follow-up with your pharmacy.

## 2017-03-26 NOTE — Telephone Encounter (Signed)
Request for refill on Jardiance 10 mg.  Last office visit 01/10/17.  Last A1C 10/11/16: 6.8.  Next OV 04/12/17.  Jardiance refilled per request.

## 2017-03-27 DIAGNOSIS — D2239 Melanocytic nevi of other parts of face: Secondary | ICD-10-CM | POA: Diagnosis not present

## 2017-03-27 DIAGNOSIS — D2272 Melanocytic nevi of left lower limb, including hip: Secondary | ICD-10-CM | POA: Diagnosis not present

## 2017-03-27 DIAGNOSIS — I788 Other diseases of capillaries: Secondary | ICD-10-CM | POA: Diagnosis not present

## 2017-03-27 DIAGNOSIS — L821 Other seborrheic keratosis: Secondary | ICD-10-CM | POA: Diagnosis not present

## 2017-03-27 DIAGNOSIS — L905 Scar conditions and fibrosis of skin: Secondary | ICD-10-CM | POA: Diagnosis not present

## 2017-03-30 ENCOUNTER — Other Ambulatory Visit: Payer: Self-pay | Admitting: Family Medicine

## 2017-04-10 DIAGNOSIS — Z1231 Encounter for screening mammogram for malignant neoplasm of breast: Secondary | ICD-10-CM | POA: Diagnosis not present

## 2017-04-12 ENCOUNTER — Encounter: Payer: Self-pay | Admitting: Family Medicine

## 2017-04-12 ENCOUNTER — Ambulatory Visit: Payer: Medicare Other | Admitting: Family Medicine

## 2017-04-12 VITALS — BP 124/76 | HR 83 | Temp 98.6°F | Wt 233.6 lb

## 2017-04-12 DIAGNOSIS — E782 Mixed hyperlipidemia: Secondary | ICD-10-CM

## 2017-04-12 DIAGNOSIS — E1142 Type 2 diabetes mellitus with diabetic polyneuropathy: Secondary | ICD-10-CM | POA: Diagnosis not present

## 2017-04-12 DIAGNOSIS — I10 Essential (primary) hypertension: Secondary | ICD-10-CM

## 2017-04-12 DIAGNOSIS — E119 Type 2 diabetes mellitus without complications: Secondary | ICD-10-CM

## 2017-04-12 DIAGNOSIS — E039 Hypothyroidism, unspecified: Secondary | ICD-10-CM | POA: Diagnosis not present

## 2017-04-12 LAB — CBC WITH DIFFERENTIAL/PLATELET
Basophils Absolute: 0.1 10*3/uL (ref 0.0–0.1)
Basophils Relative: 1.2 % (ref 0.0–3.0)
Eosinophils Absolute: 0.3 10*3/uL (ref 0.0–0.7)
Eosinophils Relative: 3.4 % (ref 0.0–5.0)
HCT: 40.9 % (ref 36.0–46.0)
Hemoglobin: 13.9 g/dL (ref 12.0–15.0)
Lymphocytes Relative: 23.2 % (ref 12.0–46.0)
Lymphs Abs: 2 10*3/uL (ref 0.7–4.0)
MCHC: 34 g/dL (ref 30.0–36.0)
MCV: 88 fl (ref 78.0–100.0)
Monocytes Absolute: 0.3 10*3/uL (ref 0.1–1.0)
Monocytes Relative: 4 % (ref 3.0–12.0)
Neutro Abs: 6 10*3/uL (ref 1.4–7.7)
Neutrophils Relative %: 68.2 % (ref 43.0–77.0)
Platelets: 324 10*3/uL (ref 150.0–400.0)
RBC: 4.64 Mil/uL (ref 3.87–5.11)
RDW: 14.4 % (ref 11.5–15.5)
WBC: 8.8 10*3/uL (ref 4.0–10.5)

## 2017-04-12 LAB — COMPREHENSIVE METABOLIC PANEL
ALT: 13 U/L (ref 0–35)
AST: 11 U/L (ref 0–37)
Albumin: 3.9 g/dL (ref 3.5–5.2)
Alkaline Phosphatase: 69 U/L (ref 39–117)
BUN: 17 mg/dL (ref 6–23)
CO2: 29 mEq/L (ref 19–32)
Calcium: 9.5 mg/dL (ref 8.4–10.5)
Chloride: 100 mEq/L (ref 96–112)
Creatinine, Ser: 0.8 mg/dL (ref 0.40–1.20)
GFR: 75.52 mL/min (ref >60.00)
Glucose, Bld: 144 mg/dL — ABNORMAL HIGH (ref 70–99)
Potassium: 3.9 mEq/L (ref 3.5–5.1)
Sodium: 138 mEq/L (ref 135–145)
Total Bilirubin: 0.6 mg/dL (ref 0.2–1.2)
Total Protein: 6.3 g/dL (ref 6.0–8.3)

## 2017-04-12 LAB — LIPID PANEL
Cholesterol: 176 mg/dL (ref 0–200)
HDL: 51.5 mg/dL (ref >39.00)
NonHDL: 124.23
Total CHOL/HDL Ratio: 3
Triglycerides: 213 mg/dL — ABNORMAL HIGH (ref 0.0–149.0)
VLDL: 42.6 mg/dL — ABNORMAL HIGH (ref 0.0–40.0)

## 2017-04-12 LAB — T4, FREE: Free T4: 1.54 ng/dL (ref 0.60–1.60)

## 2017-04-12 LAB — LDL CHOLESTEROL, DIRECT: Direct LDL: 93 mg/dL

## 2017-04-12 LAB — HEMOGLOBIN A1C: Hgb A1c MFr Bld: 7.2 % — ABNORMAL HIGH (ref 4.6–6.5)

## 2017-04-12 LAB — TSH: TSH: 2.2 u[IU]/mL (ref 0.35–4.50)

## 2017-04-12 MED ORDER — LEVOTHYROXINE SODIUM 100 MCG PO TABS: 100.0000 ug | ORAL_TABLET | Freq: Every day | ORAL | 1 refills | Status: DC

## 2017-04-12 MED ORDER — DULAGLUTIDE 0.75 MG/0.5ML ~~LOC~~ SOAJ: SUBCUTANEOUS | 0 refills | Status: DC

## 2017-04-12 MED ORDER — DULAGLUTIDE 1.5 MG/0.5ML ~~LOC~~ SOAJ: 1.0000 "pen " | SUBCUTANEOUS | 0 refills | Status: DC

## 2017-04-12 NOTE — Progress Notes (Addendum)
Brenda Griffin is a 72 y.o. female is here for follow up.  History of Present Illness:   HPI: See Assessment and Plan section for Problem Based Charting of issues discussed today.   Health Maintenance Due  Topic Date Due  . Hepatitis C Screening  1945-12-04  . OPHTHALMOLOGY EXAM  12/20/1957  . TETANUS/TDAP  12/21/1966  . MAMMOGRAM  12/20/1997  . COLONOSCOPY  12/20/1997  . DEXA SCAN  12/20/2012  . PNA vac Low Risk Adult (1 of 2 - PCV13) 12/20/2012   Depression screen PHQ 2/9 01/16/2017 10/11/2016  Decreased Interest 0 0  Down, Depressed, Hopeless 1 0  PHQ - 2 Score 1 0   PMHx, SurgHx, SocialHx, FamHx, Medications, and Allergies were reviewed in the Visit Navigator and updated as appropriate.   Patient Active Problem List   Diagnosis Date Noted  . Diabetic polyneuropathy associated with type 2 diabetes mellitus ([place]) 04/15/2017  . Hormone replacement therapy 10/11/2016  . Cluster headaches 10/11/2016  . Type 2 diabetes mellitus without complication, without long-term current use of insulin ([place]) 10/11/2016  . Hypothyroidism 10/11/2016  . Cough 10/11/2016  . Sore throat 10/11/2016  . Essential hypertension 10/11/2016  . History of skin cancer 10/11/2016  . Hyperlipidemia 10/11/2016  . Anxiety 10/11/2016  . Morbid obesity ([place]) 10/11/2016   Social History   Tobacco Use  . Smoking status: Former Research scientist (life sciences)  . Smokeless tobacco: Never Used  Substance Use Topics  . Alcohol use: Yes    Alcohol/week: 0.6 - 1.2 oz    Types: 1 - 2 Glasses of wine per week  . Drug use: No   Current Medications and Allergies:   .  acetaminophen (TYLENOL) 500 MG tablet, Take 500 mg by mouth as needed., Disp: , Rfl:  .  ALPRAZolam (XANAX) 0.5 MG tablet, Take 1 tablet (0.5 mg total) by mouth at bedtime as needed for anxiety., Disp: 30 tablet, Rfl: 2 .  atorvastatin (LIPITOR) 40 MG tablet, Take 1 tablet (40 mg total) by mouth daily., Disp: 90 tablet, Rfl: 3 .  Biotin 10000 MCG TABS, Take 1 tablet by  mouth daily as needed., Disp: , Rfl:  .  Butalbital-APAP-Caffeine 50-300-40 MG CAPS, Take 1 tablet by mouth as needed., Disp: 84 capsule, Rfl: 0 .  diphenhydrAMINE (BENADRYL) 25 MG tablet, Take 25 mg by mouth as needed., Disp: , Rfl:  .  empagliflozin (JARDIANCE) 10 MG TABS tablet, Take 10 mg by mouth daily., Disp: 30 tablet, Rfl: 2 .  estradiol (ESTRACE) 1 MG tablet, Take 1 tablet (1 mg total) by mouth daily., Disp: 90 tablet, Rfl: 3 .  glimepiride (AMARYL) 2 MG tablet, Take 1 tablet (2 mg total) by mouth 4 (four) times daily., Disp: 120 tablet, Rfl: 6 .  levothyroxine (SYNTHROID, LEVOTHROID) 100 MCG tablet, Take 100 mcg by mouth daily before breakfast., Disp: , Rfl:  .  loperamide (IMODIUM A-D) 2 MG tablet, Take 2 mg by mouth as needed for diarrhea or loose stools., Disp: , Rfl:  .  losartan-hydrochlorothiazide (HYZAAR) 100-12.5 MG tablet, Take 1 tablet by mouth daily., Disp: , Rfl:  .  metFORMIN (GLUCOPHAGE-XR) 500 MG 24 hr tablet, Take 1 tablet (500 mg total) by mouth 3 (three) times daily., Disp: 270 tablet, Rfl: 1 .  pantoprazole (PROTONIX) 40 MG tablet, Take 40 mg by mouth daily., Disp: , Rfl:  .  pioglitazone (ACTOS) 30 MG tablet, TAKE 1 TABLET(30 MG) BY MOUTH DAILY, Disp: 90 tablet, Rfl: 0 .  senna (SENOKOT) 8.6 MG tablet,  Take 1 tablet by mouth as needed for constipation., Disp: , Rfl:  .  zolpidem (AMBIEN CR) 6.25 MG CR tablet, Take 6.25 mg by mouth at bedtime as needed for sleep., Disp: , Rfl:   Allergies  Allergen Reactions  . Bacitracin Rash  . Benzoic Acid Rash  . Benzyl Salicylate Rash  . Formaldehyde Rash  . Geraniol Rash  . Hydroxyethyl Methacrylate Rash  . Methyl Methacrylate [Methylmethacrylate] Rash  . Other Rash    Amyl Cinnamaldehyde  . Adhesive [Tape] Itching  . Aspirin Nausea And Vomiting  . Codeine Nausea And Vomiting  . Victoza [Liraglutide] Diarrhea  . Butanediol [Butylene Glycol] Rash   Review of Systems   Pertinent items are noted in the HPI. Otherwise,  ROS is negative.  Vitals:   Vitals:   04/12/17 1009  BP: 124/76  Pulse: 83  Temp: 98.6 F (37 C)  TempSrc: Oral  SpO2: 96%  Weight: 233 lb 9.6 oz (106 kg)     Body mass index is 37.7 kg/m.   Physical Exam:   Physical Exam  Constitutional: She is oriented to person, place, and time. She appears well-developed and well-nourished. No distress.  HENT:  Head: Normocephalic and atraumatic.  Right Ear: External ear normal.  Left Ear: External ear normal.  Nose: Nose normal.  Mouth/Throat: Oropharynx is clear and moist.  Eyes: Conjunctivae and EOM are normal. Pupils are equal, round, and reactive to light.  Neck: Normal range of motion. Neck supple. No thyromegaly present.  Cardiovascular: Normal rate, regular rhythm, normal heart sounds and intact distal pulses.  Pulmonary/Chest: Effort normal and breath sounds normal.  Abdominal: Soft. Bowel sounds are normal.  Musculoskeletal: Normal range of motion.  Lymphadenopathy:    She has no cervical adenopathy.  Neurological: She is alert and oriented to person, place, and time.  Skin: Skin is warm and dry. Capillary refill takes less than 2 seconds.  Psychiatric: She has a normal mood and affect. Her behavior is normal.  Nursing note and vitals reviewed.   Results for orders placed or performed in visit on 04/12/17  CBC with Differential/Platelet  Result Value Ref Range   WBC 8.8 4.0 - 10.5 K/uL   RBC 4.64 3.87 - 5.11 Mil/uL   Hemoglobin 13.9 12.0 - 15.0 g/dL   HCT 40.9 36.0 - 46.0 %   MCV 88.0 78.0 - 100.0 fl   MCHC 34.0 30.0 - 36.0 g/dL   RDW 14.4 11.5 - 15.5 %   Platelets 324.0 150.0 - 400.0 K/uL   Neutrophils Relative % 68.2 43.0 - 77.0 %   Lymphocytes Relative 23.2 12.0 - 46.0 %   Monocytes Relative 4.0 3.0 - 12.0 %   Eosinophils Relative 3.4 0.0 - 5.0 %   Basophils Relative 1.2 0.0 - 3.0 %   Neutro Abs 6.0 1.4 - 7.7 K/uL   Lymphs Abs 2.0 0.7 - 4.0 K/uL   Monocytes Absolute 0.3 0.1 - 1.0 K/uL   Eosinophils Absolute  0.3 0.0 - 0.7 K/uL   Basophils Absolute 0.1 0.0 - 0.1 K/uL  Comprehensive metabolic panel  Result Value Ref Range   Sodium 138 135 - 145 mEq/L   Potassium 3.9 3.5 - 5.1 mEq/L   Chloride 100 96 - 112 mEq/L   CO2 29 19 - 32 mEq/L   Glucose, Bld 144 (H) 70 - 99 mg/dL   BUN 17 6 - 23 mg/dL   Creatinine, Ser 0.80 0.40 - 1.20 mg/dL   Total Bilirubin 0.6 0.2 - 1.2 mg/dL  Alkaline Phosphatase 69 39 - 117 U/L   AST 11 0 - 37 U/L   ALT 13 0 - 35 U/L   Total Protein 6.3 6.0 - 8.3 g/dL   Albumin 3.9 3.5 - 5.2 g/dL   Calcium 9.5 8.4 - 10.5 mg/dL   GFR 75.52 >60.00 mL/min  Hemoglobin A1c  Result Value Ref Range   Hgb A1c MFr Bld 7.2 (H) 4.6 - 6.5 %  Lipid panel  Result Value Ref Range   Cholesterol 176 0 - 200 mg/dL   Triglycerides 213.0 (H) 0.0 - 149.0 mg/dL   HDL 51.50 >39.00 mg/dL   VLDL 42.6 (H) 0.0 - 40.0 mg/dL   Total CHOL/HDL Ratio 3    NonHDL 124.23   TSH  Result Value Ref Range   TSH 2.20 0.35 - 4.50 uIU/mL  T4, free  Result Value Ref Range   Free T4 1.54 0.60 - 1.60 ng/dL  LDL cholesterol, direct  Result Value Ref Range   Direct LDL 93.0 mg/dL   Assessment and Plan:   Brenda Griffin was seen today for follow-up.  Diagnoses and all orders for this visit:  Acquired hypothyroidism Comments: Endocrine ROS: negative for - malaise/lethargy, palpitations, skin changes, temperature intolerance or unexpected weight changes. Orders: -     TSH -     T4, free -     levothyroxine (SYNTHROID, LEVOTHROID) 100 MCG tablet; Take 1 tablet (100 mcg total) by mouth daily before breakfast.  Type 2 diabetes mellitus without complication, without long-term current use of insulin (HCC) Comments: Current symptoms: no polyuria or polydipsia, no chest pain, dyspnea or TIA's. Taking medication compliantly without noted sided effects [x]   YES  []   NO  Episodes of hypoglycemia? []   YES  [x]   NO Maintaining a diabetic diet? []   YES  [x]   NO Trying to exercise on a regular basis? []   YES  []    NO  On ACE inhibitor or angiotensin II receptor blocker? []   YES  [x]   NO On Aspirin? []   YES  [x]   NO  Lab Results  Component Value Date   HGBA1C 7.2 (H) 04/12/2017    Lab Results  Component Value Date   CREATININE 0.80 04/12/2017   Instructions to start Trulicity. DC Actos after 1-2 weeks. DC Amaryl after another 1-2 weeks. Recheck in 1-2 months.   Orders: -     CBC with Differential/Platelet -     Comprehensive metabolic panel -     Hemoglobin A1c -     Dulaglutide (TRULICITY) 9.32 TF/5.7DU SOPN; 0.75 mg [place] q wk x 2 wks , then increase to 1.5 mg [place] if tolerating -     Dulaglutide (TRULICITY) 1.5 KG/2.5KY SOPN; Inject 1 pen into the skin once a week.  Essential hypertension Comments: Avoiding excessive salt intake? [x]   YES  []   NO Trying to exercise on a regular basis? []   YES  [x]   NO Review: taking medications as instructed, no medication side effects noted, no TIAs, no chest pain on exertion, no dyspnea on exertion, no swelling of ankles.  Smoker: No.  Wt Readings from Last 3 Encounters:  04/12/17 233 lb 9.6 oz (106 kg)  01/18/17 227 lb (103 kg)  01/16/17 225 lb 12.8 oz (102.4 kg)   BP Readings from Last 3 Encounters:  04/12/17 124/76  01/23/17 124/85  01/18/17 128/78   Lab Results  Component Value Date   CREATININE 0.80 04/12/2017    Mixed hyperlipidemia Comments: Is the patient taking medications without  problems? [x]   YES  []   NO Does the patient complain of muscle aches?   []   YES  [x]    NO Trying to exercise on a regular basis? []   YES  [x]   NO Diet Compliance: noncompliant some of the time. Cardiovascular ROS: no chest pain or dyspnea on exertion.   Lipids:    Component Value Date/Time   CHOL 176 04/12/2017 1037   TRIG 213.0 (H) 04/12/2017 1037   HDL 51.50 04/12/2017 1037   LDLDIRECT 93.0 04/12/2017 1037   VLDL 42.6 (H) 04/12/2017 1037   CHOLHDL 3 04/12/2017 1037   Orders: -     Lipid panel -     LDL cholesterol, direct  Morbid obesity  (HCC) Comments: The patient is asked to make an attempt to improve diet and exercise patterns to aid in medical management of this problem.   Diabetic polyneuropathy associated with type 2 diabetes mellitus ([place])    . Reviewed expectations re: course of current medical issues. . Discussed self-management of symptoms. . Outlined signs and symptoms indicating need for more acute intervention. . Patient verbalized understanding and all questions were answered. Mar[facility] Kitchen Health Maintenance issues including appropriate healthy diet, exercise, and smoking avoidance were discussed with patient. . See orders for this visit as documented in the electronic medical record. . Patient received an After Visit Summary.  Briscoe Deutscher, DO [place] 04/15/2017  Future Appointments  Date Time Provider [place]  07/10/2017  9:20 AM Briscoe Deutscher, DO [facility]  01/23/2018 10:00 AM Williemae Area, RN [facility]  01/23/2018 11:00 AM [facility]  01/30/2018  1:00 PM Briscoe Deutscher, DO [facility]

## 2017-04-15 DIAGNOSIS — E1142 Type 2 diabetes mellitus with diabetic polyneuropathy: Secondary | ICD-10-CM | POA: Insufficient documentation

## 2017-04-16 ENCOUNTER — Encounter: Payer: Self-pay | Admitting: Family Medicine

## 2017-04-19 ENCOUNTER — Other Ambulatory Visit: Payer: Self-pay | Admitting: Family Medicine

## 2017-04-30 ENCOUNTER — Encounter: Payer: Self-pay | Admitting: Obstetrics and Gynecology

## 2017-05-04 ENCOUNTER — Other Ambulatory Visit: Payer: Self-pay | Admitting: *Deleted

## 2017-05-04 ENCOUNTER — Telehealth: Payer: Self-pay | Admitting: Family Medicine

## 2017-05-04 DIAGNOSIS — E119 Type 2 diabetes mellitus without complications: Secondary | ICD-10-CM

## 2017-05-04 MED ORDER — DULAGLUTIDE 1.5 MG/0.5ML ~~LOC~~ SOAJ: 1.0000 "pen " | SUBCUTANEOUS | 0 refills | Status: DC

## 2017-05-04 NOTE — Telephone Encounter (Signed)
Copied from [place] [id number]. Topic: General - Other >> May 04, 2017  9:39 AM Darl Householder, RMA wrote: Reason for CRM: Medication refill request for Dulaglutide (TRULICITY) 1.5 VY/7.2JL SOPN to be sent to [facility]

## 2017-05-04 NOTE — Telephone Encounter (Signed)
Pt  States  She  Is not  Having  Any  Symptoms   From the  trulicity  Larger  Dose  And  Would like  A  Refill

## 2017-05-10 ENCOUNTER — Other Ambulatory Visit: Payer: Self-pay | Admitting: Family Medicine

## 2017-05-10 DIAGNOSIS — E119 Type 2 diabetes mellitus without complications: Secondary | ICD-10-CM

## 2017-05-10 NOTE — Telephone Encounter (Signed)
See note

## 2017-05-10 NOTE — Telephone Encounter (Signed)
Patient would like to know if she can get samples of Trulicity since her RX will not be refilled until tomorrow. Please contact pt  [phone]

## 2017-05-11 MED ORDER — DULAGLUTIDE 1.5 MG/0.5ML ~~LOC~~ SOAJ: SUBCUTANEOUS | 0 refills | Status: DC

## 2017-05-11 NOTE — Telephone Encounter (Signed)
Patient called she will come by and pick up sample and I have called refill for her as well.

## 2017-05-29 ENCOUNTER — Telehealth: Payer: Self-pay | Admitting: Family Medicine

## 2017-05-29 DIAGNOSIS — E119 Type 2 diabetes mellitus without complications: Secondary | ICD-10-CM | POA: Diagnosis not present

## 2017-05-29 LAB — HM DIABETES EYE EXAM

## 2017-05-29 MED ORDER — LOSARTAN POTASSIUM-HCTZ 100-12.5 MG PO TABS: 1.0000 | ORAL_TABLET | Freq: Every day | ORAL | 1 refills | Status: DC

## 2017-05-29 NOTE — Telephone Encounter (Signed)
Copied from [place] [id number]. Topic: Quick Communication - Rx Refill/Question >> May 29, 2017 11:41 AM Bea Graff, NT wrote: Medication: Losartan   Has the patient contacted their pharmacy? Yes.     (Agent: If no, request that the patient contact the pharmacy for the refill.)   Preferred Pharmacy (with phone number or street name): [facility] [place]: Please be advised that RX refills may take up to 3 business days. We ask that you follow-up with your pharmacy.

## 2017-05-31 ENCOUNTER — Encounter: Payer: Self-pay | Admitting: Surgical

## 2017-06-12 ENCOUNTER — Other Ambulatory Visit: Payer: Self-pay | Admitting: Family Medicine

## 2017-06-12 DIAGNOSIS — E119 Type 2 diabetes mellitus without complications: Secondary | ICD-10-CM

## 2017-07-06 ENCOUNTER — Other Ambulatory Visit: Payer: Self-pay | Admitting: Family Medicine

## 2017-07-06 NOTE — Telephone Encounter (Signed)
Copied from [place]. Topic: Quick Communication - Rx Refill/Question >> Jul 06, 2017  9:36 AM Lennox Solders wrote: Medication: pantoprazole 40 mg #90 Has the patient contacted their pharmacy yes Preferred Pharmacy (with phone number or street name):  [facility] . Pt is out

## 2017-07-06 NOTE — Telephone Encounter (Signed)
Rx request for historical medication: pantoprazole 40 mg  LOV: 04/12/17  PCP: [place]: verified

## 2017-07-09 MED ORDER — PANTOPRAZOLE SODIUM 40 MG PO TBEC: 40.0000 mg | DELAYED_RELEASE_TABLET | Freq: Every day | ORAL | 4 refills | Status: DC

## 2017-07-09 NOTE — Progress Notes (Signed)
Brenda Griffin is a 72 y.o. female is here for follow up.  History of Present Illness:   HPI: Patient presents for 22-month follow-up.  History of diabetes and recently transitioned to Trulicity.  She is tolerating it fairly well.  She does still have some feelings of dyspepsia and intermittent diarrhea at the 1.5 mg dose weekly.  She has eliminated Actos, Jardiance, and has decreased her glimepiride at this point.  A1c today at 6.2, which is down from 7.2 a few months ago.  She has lost 10 pounds.  She would like to continue with Trulicity but asks if there is anything that can happen to help with the dyspepsia.  The 10-year ASCVD risk score Brenda Griffin DC Brenda Griffin., et al., 2013) is: 21.4%   Values used to calculate the score:     Age: 62 years     Sex: Female     Is Non-Hispanic African American: No     Diabetic: Yes     Tobacco smoker: No     Systolic Blood Pressure: 283 mmHg     Is BP treated: Yes     HDL Cholesterol: 51.5 mg/dL     Total Cholesterol: 176 mg/dL  Noted allergy to aspirin.  Health Maintenance Due  Topic Date Due  . Hepatitis C Screening  January 31, 1946  . COLONOSCOPY  12/20/1997   Depression screen PHQ 2/9 01/16/2017 10/11/2016  Decreased Interest 0 0  Down, Depressed, Hopeless 1 0  PHQ - 2 Score 1 0   PMHx, SurgHx, SocialHx, FamHx, Medications, and Allergies were reviewed in the Visit Navigator and updated as appropriate.   Patient Active Problem List   Diagnosis Date Noted  . Diabetic polyneuropathy associated with type 2 diabetes mellitus ([place]) 04/15/2017  . Hormone replacement therapy 10/11/2016  . Cluster headaches 10/11/2016  . Type 2 diabetes mellitus without complication, without long-term current use of insulin ([place]) 10/11/2016  . Hypothyroidism 10/11/2016  . Cough 10/11/2016  . Essential hypertension 10/11/2016  . History of skin cancer 10/11/2016  . Hyperlipidemia 10/11/2016  . Anxiety 10/11/2016  . Morbid obesity ([place]) 10/11/2016   Social History    Tobacco Use  . Smoking status: Former Smoker    Packs/day: 1.00    Years: 11.00    Pack years: 11.00    Types: Cigarettes    Last attempt to quit: 03/20/1981    Years since quitting: 36.3  . Smokeless tobacco: Never Used  Substance Use Topics  . Alcohol use: Yes    Alcohol/week: 0.6 - 1.2 oz    Types: 1 - 2 Glasses of wine per week  . Drug use: No   Current Medications and Allergies:   .  acetaminophen (TYLENOL) 500 MG tablet, Take 500 mg by mouth as needed., Disp: , Rfl:  .  ALPRAZolam (XANAX) 0.5 MG tablet, Take 1 tablet (0.5 mg total) by mouth at bedtime as needed for anxiety., Disp: 30 tablet, Rfl: 2 .  atorvastatin (LIPITOR) 40 MG tablet, Take 1 tablet (40 mg total) by mouth daily., Disp: 90 tablet, Rfl: 3 .  Biotin 10000 MCG TABS, Take 1 tablet by mouth daily as needed., Disp: , Rfl:  .  Butalbital-APAP-Caffeine 50-300-40 MG CAPS, Take 1 tablet by mouth as needed., Disp: 84 capsule, Rfl: 0 .  diphenhydrAMINE (BENADRYL) 25 MG tablet, Take 25 mg by mouth as needed., Disp: , Rfl:  .  estradiol (ESTRACE) 1 MG tablet, Take 1 tablet (1 mg total) by mouth daily., Disp: 90 tablet, Rfl: 3 .  glimepiride (AMARYL) 2 MG tablet, Take 1 tablet (2 mg total) by mouth 4 (four) times daily., Disp: 120 tablet, Rfl: 6 .  levothyroxine (SYNTHROID, LEVOTHROID) 100 MCG tablet, Take 1 tablet (100 mcg total) by mouth daily before breakfast., Disp: 90 tablet, Rfl: 1 .  loperamide (IMODIUM A-D) 2 MG tablet, Take 2 mg by mouth as needed for diarrhea or loose stools., Disp: , Rfl:  .  losartan-hydrochlorothiazide (HYZAAR) 100-12.5 MG tablet, Take 1 tablet by mouth daily., Disp: 90 tablet, Rfl: 1 .  metFORMIN (GLUCOPHAGE-XR) 500 MG 24 hr tablet, TAKE 1 TABLET(500 MG) BY MOUTH THREE TIMES DAILY, Disp: 270 tablet, Rfl: 1 .  pantoprazole (PROTONIX) 40 MG tablet, Take 40 mg by mouth daily., Disp: , Rfl:  .  senna (SENOKOT) 8.6 MG tablet, Take 1 tablet by mouth as needed for constipation., Disp: , Rfl:  .   TRULICITY 1.5 UM/3.5TI SOPN, INEJCT 1.5 MG WEEKLY, Disp: 2 mL, Rfl: 3 .  zolpidem (AMBIEN CR) 6.25 MG CR tablet, Take 6.25 mg by mouth at bedtime as needed for sleep., Disp: , Rfl:    Allergies  Allergen Reactions  . Bacitracin Rash  . Benzoic Acid Rash  . Benzyl Salicylate Rash  . Formaldehyde Rash  . Geraniol Rash  . Hydroxyethyl Methacrylate Rash  . Methyl Methacrylate [Methylmethacrylate] Rash  . Other Rash    Amyl Cinnamaldehyde  . Adhesive [Tape] Itching  . Aspirin Nausea And Vomiting  . Codeine Nausea And Vomiting  . Victoza [Liraglutide] Diarrhea  . Butanediol [Butylene Glycol] Rash   Review of Systems   Pertinent items are noted in the HPI. Otherwise, ROS is negative.  Vitals:   Vitals:   07/10/17 0921  BP: 132/84  Pulse: 94  Temp: 97.7 F (36.5 C)  TempSrc: Oral  SpO2: 97%  Weight: 224 lb (101.6 kg)  Height: 5\' 6"  (1.676 m)     Body mass index is 36.15 kg/m.   Physical Exam:   Physical Exam  Constitutional: She appears well-developed and well-nourished. No distress.  HENT:  Head: Normocephalic and atraumatic.  Eyes: Pupils are equal, round, and reactive to light. EOM are normal.  Neck: Normal range of motion. Neck supple.  Cardiovascular: Normal rate, regular rhythm, normal heart sounds and intact distal pulses.  Pulmonary/Chest: Effort normal.  Abdominal: Soft.  Skin: Skin is warm.  Psychiatric: She has a normal mood and affect. Her behavior is normal.  Nursing note and vitals reviewed.   Results for orders placed or performed in visit on 05/31/17  HM DIABETES EYE EXAM  Result Value Ref Range   HM Diabetic Eye Exam No Retinopathy No Retinopathy   Assessment and Plan:   [name] was seen today for follow-up.  Diagnoses and all orders for this visit:  Essential hypertension Comments: Doing well.  Continue current treatment.  Type 2 diabetes mellitus without complication, without long-term current use of insulin (HCC) Comments: Improving  significantly.  Continue Trulicity.  Okay to decrease to the half dose for 2 more weeks to become used to it.  Goal is to wean Amaryl. Orders: -     POCT glycosylated hemoglobin (Hb A1C)  Morbid obesity (HCC) Comments: Encourage exercise and healthy food choices.  Gastroesophageal reflux disease without esophagitis Comments: We will add Zantac for the next few weeks.  We will decrease Trulicity dose for 2 weeks.  Encouraged adding fiber. Orders: -     ranitidine (ZANTAC) 300 MG tablet; Take 1 tablet (300 mg total) by mouth at bedtime.   Marland Kitchen  Reviewed expectations re: course of current medical issues. . Discussed self-management of symptoms. . Outlined signs and symptoms indicating need for more acute intervention. . Patient verbalized understanding and all questions were answered. Marland Kitchen Health Maintenance issues including appropriate healthy diet, exercise, and smoking avoidance were discussed with patient. . See orders for this visit as documented in the electronic medical record. . Patient received an After Visit Summary.  Briscoe Deutscher, DO , [facility] 07/10/2017  Future Appointments  Date Time Provider [place]  10/09/2017  9:40 AM Briscoe Deutscher, DO [facility]  01/23/2018 10:00 AM Williemae Area, RN [facility]  01/23/2018 11:00 AM [facility]  01/30/2018  1:00 PM Briscoe Deutscher, DO [facility]   CMA served as scribe during this visit. History, Physical, and Plan performed by medical provider. The above documentation has been reviewed and is accurate and complete. Briscoe Deutscher, D.O.

## 2017-07-09 NOTE — Telephone Encounter (Signed)
See note

## 2017-07-10 ENCOUNTER — Ambulatory Visit: Payer: Medicare Other | Admitting: Family Medicine

## 2017-07-10 ENCOUNTER — Encounter: Payer: Self-pay | Admitting: Family Medicine

## 2017-07-10 VITALS — BP 132/84 | HR 94 | Temp 97.7°F | Ht 66.0 in | Wt 224.0 lb

## 2017-07-10 DIAGNOSIS — I10 Essential (primary) hypertension: Secondary | ICD-10-CM

## 2017-07-10 DIAGNOSIS — E119 Type 2 diabetes mellitus without complications: Secondary | ICD-10-CM

## 2017-07-10 DIAGNOSIS — K219 Gastro-esophageal reflux disease without esophagitis: Secondary | ICD-10-CM | POA: Diagnosis not present

## 2017-07-10 LAB — POCT GLYCOSYLATED HEMOGLOBIN (HGB A1C): Hemoglobin A1C: 6.2

## 2017-07-10 MED ORDER — RANITIDINE HCL 300 MG PO TABS: 300.0000 mg | ORAL_TABLET | Freq: Every day | ORAL | 2 refills | Status: DC

## 2017-07-16 ENCOUNTER — Telehealth: Payer: Self-pay | Admitting: Obstetrics and Gynecology

## 2017-07-16 ENCOUNTER — Telehealth: Payer: Self-pay | Admitting: Family Medicine

## 2017-07-16 NOTE — Telephone Encounter (Signed)
Spoke with pharmacy, advised that rx was sent to pharmacy 12/13/16 #90 w/3 refills. Pharmacist did indeed have the prescription on file and will get it ready for patient to pick up. Patient has been advised.

## 2017-07-16 NOTE — Telephone Encounter (Signed)
Called pt and asked her to speak with her gyn regarding her refill request for estradiol. Dr. Talbert Nan prescribed this med. Pt voiced understanding.

## 2017-07-16 NOTE — Telephone Encounter (Signed)
Patient called requesting refills on estradiol to last until her AEX on 12/27/17. She said she requested refills from her pharmacy but they sent the request to the wrong office. Pharmacy on file confirmed.

## 2017-07-16 NOTE — Telephone Encounter (Signed)
Copied from [place] [id number]. Topic: Quick Communication - Rx Refill/Question >> Jul 16, 2017  9:59 AM Ahmed Prima L wrote: Medication: estradiol (ESTRACE) 1 MG tablet Has the patient contacted their pharmacy? Yes no refills (Agent: If no, request that the patient contact the pharmacy for the refill.) Preferred Pharmacy (with phone number or street name): [id number] - [place], [place] - [address] Agent: Please be advised that RX refills may take up to 3 business days. We ask that you follow-up with your pharmacy.

## 2017-07-17 ENCOUNTER — Other Ambulatory Visit: Payer: Self-pay | Admitting: Family Medicine

## 2017-09-27 ENCOUNTER — Other Ambulatory Visit: Payer: Self-pay | Admitting: Family Medicine

## 2017-09-27 DIAGNOSIS — E119 Type 2 diabetes mellitus without complications: Secondary | ICD-10-CM

## 2017-10-02 ENCOUNTER — Other Ambulatory Visit: Payer: Self-pay | Admitting: Family Medicine

## 2017-10-02 ENCOUNTER — Telehealth: Payer: Self-pay | Admitting: Family Medicine

## 2017-10-02 DIAGNOSIS — E039 Hypothyroidism, unspecified: Secondary | ICD-10-CM

## 2017-10-02 NOTE — Telephone Encounter (Signed)
Rx refilled by office- 10/02/17

## 2017-10-02 NOTE — Telephone Encounter (Unsigned)
Copied from [place] [id number]. Topic: Quick Communication - Rx Refill/Question >> Oct 02, 2017 10:16 AM [name] wrote: levothyroxine (SYNTHROID, LEVOTHROID) 100 MCG tablet  Needing refills  [facility] - [place],  - [name] [place] [address] Phone: [phone] Fax: [phone]

## 2017-10-09 ENCOUNTER — Encounter: Payer: Self-pay | Admitting: Family Medicine

## 2017-10-09 ENCOUNTER — Ambulatory Visit: Payer: Medicare Other | Admitting: Family Medicine

## 2017-10-09 VITALS — BP 136/84 | HR 63 | Temp 98.1°F | Ht 66.0 in | Wt 218.2 lb

## 2017-10-09 DIAGNOSIS — E1169 Type 2 diabetes mellitus with other specified complication: Secondary | ICD-10-CM

## 2017-10-09 DIAGNOSIS — E119 Type 2 diabetes mellitus without complications: Secondary | ICD-10-CM | POA: Diagnosis not present

## 2017-10-09 DIAGNOSIS — I10 Essential (primary) hypertension: Secondary | ICD-10-CM | POA: Diagnosis not present

## 2017-10-09 DIAGNOSIS — I152 Hypertension secondary to endocrine disorders: Secondary | ICD-10-CM

## 2017-10-09 DIAGNOSIS — Z1159 Encounter for screening for other viral diseases: Secondary | ICD-10-CM

## 2017-10-09 DIAGNOSIS — E1159 Type 2 diabetes mellitus with other circulatory complications: Secondary | ICD-10-CM | POA: Diagnosis not present

## 2017-10-09 DIAGNOSIS — Z9189 Other specified personal risk factors, not elsewhere classified: Secondary | ICD-10-CM | POA: Diagnosis not present

## 2017-10-09 DIAGNOSIS — E785 Hyperlipidemia, unspecified: Secondary | ICD-10-CM

## 2017-10-09 DIAGNOSIS — Z1211 Encounter for screening for malignant neoplasm of colon: Secondary | ICD-10-CM

## 2017-10-09 LAB — POCT GLYCOSYLATED HEMOGLOBIN (HGB A1C): Hemoglobin A1C: 7.1 % — AB (ref 4.0–5.6)

## 2017-10-09 LAB — MICROALBUMIN / CREATININE URINE RATIO
Creatinine,U: 107.7 mg/dL
Microalb Creat Ratio: 0.7 mg/g (ref 0.0–30.0)
Microalb, Ur: <0.7 mg/dL (ref 0.0–1.9)

## 2017-10-09 LAB — LDL CHOLESTEROL, DIRECT: Direct LDL: 82 mg/dL

## 2017-10-09 LAB — LIPID PANEL
Cholesterol: 174 mg/dL (ref 0–200)
HDL: 45 mg/dL
NonHDL: 128.54
Total CHOL/HDL Ratio: 4
Triglycerides: 307 mg/dL — ABNORMAL HIGH (ref 0.0–149.0)
VLDL: 61.4 mg/dL — ABNORMAL HIGH (ref 0.0–40.0)

## 2017-10-09 NOTE — Progress Notes (Signed)
Brenda Griffin is a 72 y.o. female is here for follow up.  History of Present Illness:   Lonell Grandchild, CMA acting as scribe for Dr. Briscoe Deutscher.   HPI: Patient in for follow up. No new problems. She is taking all medications as prescribed with no problems.   Current symptoms: no polyuria or polydipsia, no chest pain, dyspnea or TIA's, no numbness, tingling or pain in extremities.  Taking medication compliantly without noted sided effects [x]   YES  []   NO  Episodes of hypoglycemia? []   YES  [x]   NO Maintaining a diabetic diet? [x]   YES  []   NO Trying to exercise on a regular basis? []   YES  [x]   NO  On ACE inhibitor or angiotensin II receptor blocker? [x]   YES  []   NO On Aspirin? []   YES  [x]   NO  Lab Results  Component Value Date   HGBA1C 7.1 (A) 10/09/2017    Lab Results  Component Value Date   MICROALBUR <0.7 10/09/2017    Lab Results  Component Value Date   CHOL 174 10/09/2017   HDL 45.00 10/09/2017   LDLDIRECT 82.0 10/09/2017   TRIG 307.0 (H) 10/09/2017   CHOLHDL 4 10/09/2017     Wt Readings from Last 3 Encounters:  10/09/17 218 lb 3.2 oz (99 kg)  07/10/17 224 lb (101.6 kg)  04/12/17 233 lb 9.6 oz (106 kg)   BP Readings from Last 3 Encounters:  10/09/17 136/84  07/10/17 132/84  04/12/17 124/76   Lab Results  Component Value Date   CREATININE 0.84 10/09/2017   Health Maintenance Due  Topic Date Due  . Fecal DNA (Cologuard)  12/20/1997   Depression screen [facility] 2/9 10/09/2017 01/16/2017 10/11/2016  Decreased Interest 0 0 0  Down, Depressed, Hopeless 0 1 0  PHQ - 2 Score 0 1 0  Altered sleeping 3 - -  Tired, decreased energy 0 - -  Change in appetite 0 - -  Feeling bad or failure about yourself  0 - -  Trouble concentrating 0 - -  Moving slowly or fidgety/restless 0 - -  Suicidal thoughts 0 - -  PHQ-9 Score 3 - -  Difficult doing work/chores Not difficult at all - -   PMHx, SurgHx, SocialHx, FamHx, Medications, and Allergies were reviewed in the  Visit Navigator and updated as appropriate.   Patient Active Problem List   Diagnosis Date Noted  . Diabetic polyneuropathy associated with type 2 diabetes mellitus ([place]) 04/15/2017  . Hormone replacement therapy 10/11/2016  . Cluster headaches 10/11/2016  . Type 2 diabetes mellitus without complication, without long-term current use of insulin ([place]) 10/11/2016  . Hypothyroidism 10/11/2016  . Cough 10/11/2016  . Hypertension associated with diabetes ([place]) 10/11/2016  . History of skin cancer 10/11/2016  . Hyperlipidemia associated with type 2 diabetes mellitus ([place]) 10/11/2016  . Anxiety 10/11/2016  . Morbid obesity ([place]) 10/11/2016   Social History   Tobacco Use  . Smoking status: Former Smoker    Packs/day: 1.00    Years: 11.00    Pack years: 11.00    Types: Cigarettes    Last attempt to quit: 03/20/1981    Years since quitting: 36.5  . Smokeless tobacco: Never Used  Substance Use Topics  . Alcohol use: Yes    Alcohol/week: 0.6 - 1.2 oz    Types: 1 - 2 Glasses of wine per week  . Drug use: No   Current Medications and Allergies:   Current Outpatient  Medications:  .  acetaminophen (TYLENOL) 500 MG tablet, Take 500 mg by mouth as needed., Disp: , Rfl:  .  ALPRAZolam (XANAX) 0.5 MG tablet, Take 1 tablet (0.5 mg total) by mouth at bedtime as needed for anxiety., Disp: 30 tablet, Rfl: 2 .  atorvastatin (LIPITOR) 40 MG tablet, Take 1 tablet (40 mg total) by mouth daily., Disp: 90 tablet, Rfl: 3 .  Biotin 10000 MCG TABS, Take 1 tablet by mouth daily as needed., Disp: , Rfl:  .  diphenhydrAMINE (BENADRYL) 25 MG tablet, Take 25 mg by mouth as needed., Disp: , Rfl:  .  estradiol (ESTRACE) 1 MG tablet, Take 1 tablet (1 mg total) by mouth daily., Disp: 90 tablet, Rfl: 3 .  glimepiride (AMARYL) 2 MG tablet, TAKE 1 TABLET(2 MG) BY MOUTH FOUR TIMES DAILY (Patient taking differently: 1 tab bid), Disp: 120 tablet, Rfl: 5 .  levothyroxine (SYNTHROID, LEVOTHROID) 100 MCG tablet, TAKE 1  TABLET(100 MCG) BY MOUTH DAILY BEFORE BREAKFAST, Disp: 90 tablet, Rfl: 0 .  loperamide (IMODIUM A-D) 2 MG tablet, Take 2 mg by mouth as needed for diarrhea or loose stools., Disp: , Rfl:  .  losartan-hydrochlorothiazide (HYZAAR) 100-12.5 MG tablet, Take 1 tablet by mouth daily., Disp: 90 tablet, Rfl: 1 .  metFORMIN (GLUCOPHAGE-XR) 500 MG 24 hr tablet, TAKE 1 TABLET(500 MG) BY MOUTH THREE TIMES DAILY, Disp: 270 tablet, Rfl: 1 .  pantoprazole (PROTONIX) 40 MG tablet, Take 1 tablet (40 mg total) by mouth daily., Disp: 30 tablet, Rfl: 4 .  senna (SENOKOT) 8.6 MG tablet, Take 1 tablet by mouth as needed for constipation., Disp: , Rfl:  .  TRULICITY 1.5 SA/6.3KZ SOPN, INEJCT 1.5 MG WEEKLY, Disp: 2 mL, Rfl: 0 .  rosuvastatin (CRESTOR) 20 MG tablet, Take 1 tablet (20 mg total) by mouth daily., Disp: 90 tablet, Rfl: 3   Allergies  Allergen Reactions  . Bacitracin Rash  . Benzoic Acid Rash  . Benzyl Salicylate Rash  . Formaldehyde Rash  . Geraniol Rash  . Hydroxyethyl Methacrylate Rash  . Methyl Methacrylate [Methylmethacrylate] Rash  . Other Rash    Amyl Cinnamaldehyde  . Parabens Hives  . Adhesive [Tape] Itching  . Aspirin Nausea And Vomiting  . Codeine Nausea And Vomiting  . Victoza [Liraglutide] Diarrhea  . Butanediol [Butylene Glycol] Rash   Review of Systems   Pertinent items are noted in the HPI. Otherwise, ROS is negative.  Vitals:   Vitals:   10/09/17 0924  BP: 136/84  Pulse: 63  Temp: 98.1 F (36.7 C)  TempSrc: Oral  SpO2: 96%  Weight: 218 lb 3.2 oz (99 kg)  Height: 5' 6"  (1.676 m)     Body mass index is 35.22 kg/m.  Physical Exam:   Physical Exam  Constitutional: She appears well-nourished.  HENT:  Head: Normocephalic and atraumatic.  Eyes: Pupils are equal, round, and reactive to light. EOM are normal.  Neck: Normal range of motion. Neck supple.  Cardiovascular: Normal rate, regular rhythm, normal heart sounds and intact distal pulses.  Pulmonary/Chest:  Effort normal.  Abdominal: Soft.  Skin: Skin is warm.  Psychiatric: She has a normal mood and affect. Her behavior is normal.  Nursing note and vitals reviewed.  Results for orders placed or performed in visit on 10/09/17  Hepatitis C antibody  Result Value Ref Range   Hepatitis C Ab NON-REACTIVE NON-REACTI   SIGNAL TO CUT-OFF 0.02 <1.00  Microalbumin / creatinine urine ratio  Result Value Ref Range   Microalb, Ur <0.7  0.0 - 1.9 mg/dL   Creatinine,U 107.7 mg/dL   Microalb Creat Ratio 0.7 0.0 - 30.0 mg/g  Lipid panel  Result Value Ref Range   Cholesterol 174 0 - 200 mg/dL   Triglycerides 307.0 (H) 0.0 - 149.0 mg/dL   HDL 45.00 >39.00 mg/dL   VLDL 61.4 (H) 0.0 - 40.0 mg/dL   Total CHOL/HDL Ratio 4    NonHDL 128.54   Comprehensive metabolic panel  Result Value Ref Range   Glucose, Bld 153 (H) 65 - 99 mg/dL   BUN 12 7 - 25 mg/dL   Creat 0.84 0.50 - 0.99 mg/dL   BUN/Creatinine Ratio NOT APPLICABLE 6 - 22 (calc)   Sodium 137 135 - 146 mmol/L   Potassium 3.9 3.5 - 5.3 mmol/L   Chloride 97 (L) 98 - 110 mmol/L   CO2 23 20 - 32 mmol/L   Calcium 9.4 8.6 - 10.4 mg/dL   Total Protein 6.5 6.1 - 8.1 g/dL   Albumin 4.0 3.6 - 5.1 g/dL   Globulin 2.5 1.9 - 3.7 g/dL (calc)   AG Ratio 1.6 1.0 - 2.5 (calc)   Total Bilirubin 0.6 0.2 - 1.2 mg/dL   Alkaline phosphatase (APISO) 77 33 - 130 U/L   AST 15 10 - 35 U/L   ALT 14 6 - 29 U/L  LDL cholesterol, direct  Result Value Ref Range   Direct LDL 82.0 mg/dL  POCT glycosylated hemoglobin (Hb A1C)  Result Value Ref Range   Hemoglobin A1C 7.1 (A) 4.0 - 5.6 %   HbA1c POC (<> result, manual entry)  4.0 - 5.6 %   HbA1c, POC (prediabetic range)  5.7 - 6.4 %   HbA1c, POC (controlled diabetic range)  0.0 - 7.0 %    Assessment and Plan:   [place] was seen today for follow-up.  Diagnoses and all orders for this visit:  Type 2 diabetes mellitus without complication, without long-term current use of insulin (HCC) -     POCT glycosylated  hemoglobin (Hb A1C) -     Microalbumin / creatinine urine ratio -     Comprehensive metabolic panel  Morbid obesity (HCC) Comments: Reviewed the importance of regular exercise.   Hyperlipidemia associated with type 2 diabetes mellitus (HCC) -     Cancel: Comp Met (CMET) -     Lipid panel -     Comprehensive metabolic panel -     LDL cholesterol, direct -     rosuvastatin (CRESTOR) 20 MG tablet; Take 1 tablet (20 mg total) by mouth daily.  Hypertension associated with diabetes ([place]) -     Comprehensive metabolic panel  Encounter for hepatitis C virus screening test for high risk patient -     Hepatitis C antibody  Colon cancer screening -     Cologuard    . Reviewed expectations re: course of current medical issues. . Discussed self-management of symptoms. . Outlined signs and symptoms indicating need for more acute intervention. . Patient verbalized understanding and all questions were answered. Marland Kitchen Health Maintenance issues including appropriate healthy diet, exercise, and smoking avoidance were discussed with patient. . See orders for this visit as documented in the electronic medical record. . Patient received an After Visit Summary.  Briscoe Deutscher, DO [place], [facility] 10/11/2017  Future Appointments  Date Time Provider [place]  12/27/2017  3:30 PM Salvadore Dom, MD [place] [facility]  01/09/2018  9:40 AM Briscoe Deutscher, DO [facility]  01/23/2018 10:00 AM [facility] HEALTH COACH [facility]  01/23/2018 11:00 AM [facility] LAB [facility]  01/30/2018  1:00 PM Briscoe Deutscher, DO [facility]   CMA served as scribe during this visit. History, Physical, and Plan performed by medical provider. The above documentation has been reviewed and is accurate and complete. Briscoe Deutscher, D.O.

## 2017-10-10 LAB — COMPREHENSIVE METABOLIC PANEL
AG Ratio: 1.6 (calc) (ref 1.0–2.5)
ALT: 14 U/L (ref 6–29)
AST: 15 U/L (ref 10–35)
Albumin: 4 g/dL (ref 3.6–5.1)
Alkaline phosphatase (APISO): 77 U/L (ref 33–130)
BUN: 12 mg/dL (ref 7–25)
CO2: 23 mmol/L (ref 20–32)
Calcium: 9.4 mg/dL (ref 8.6–10.4)
Chloride: 97 mmol/L — ABNORMAL LOW (ref 98–110)
Creat: 0.84 mg/dL (ref 0.50–0.99)
Globulin: 2.5 g/dL (calc) (ref 1.9–3.7)
Glucose, Bld: 153 mg/dL — ABNORMAL HIGH (ref 65–99)
Potassium: 3.9 mmol/L (ref 3.5–5.3)
Sodium: 137 mmol/L (ref 135–146)
Total Bilirubin: 0.6 mg/dL (ref 0.2–1.2)
Total Protein: 6.5 g/dL (ref 6.1–8.1)

## 2017-10-10 LAB — HEPATITIS C ANTIBODY
Hepatitis C Ab: NONREACTIVE
SIGNAL TO CUT-OFF: 0.02 (ref <1.00)

## 2017-10-11 MED ORDER — ROSUVASTATIN CALCIUM 20 MG PO TABS: 20.0000 mg | ORAL_TABLET | Freq: Every day | ORAL | 3 refills | Status: DC

## 2017-10-19 DIAGNOSIS — Z1212 Encounter for screening for malignant neoplasm of rectum: Secondary | ICD-10-CM | POA: Diagnosis not present

## 2017-10-19 DIAGNOSIS — Z1211 Encounter for screening for malignant neoplasm of colon: Secondary | ICD-10-CM | POA: Diagnosis not present

## 2017-10-23 ENCOUNTER — Other Ambulatory Visit: Payer: Self-pay | Admitting: Family Medicine

## 2017-10-23 DIAGNOSIS — E119 Type 2 diabetes mellitus without complications: Secondary | ICD-10-CM

## 2017-10-30 LAB — COLOGUARD: Cologuard: NEGATIVE

## 2017-11-06 ENCOUNTER — Other Ambulatory Visit: Payer: Self-pay | Admitting: Family Medicine

## 2017-11-06 DIAGNOSIS — E119 Type 2 diabetes mellitus without complications: Secondary | ICD-10-CM

## 2017-11-06 MED ORDER — DULAGLUTIDE 1.5 MG/0.5ML ~~LOC~~ SOAJ: 1.5000 mg | SUBCUTANEOUS | 4 refills | Status: DC

## 2017-11-20 ENCOUNTER — Other Ambulatory Visit: Payer: Self-pay | Admitting: Family Medicine

## 2017-11-21 ENCOUNTER — Other Ambulatory Visit: Payer: Self-pay | Admitting: Family Medicine

## 2017-11-23 NOTE — Progress Notes (Signed)
Error

## 2017-12-20 ENCOUNTER — Other Ambulatory Visit: Payer: Self-pay | Admitting: Family Medicine

## 2017-12-24 NOTE — Progress Notes (Signed)
72 y.o. [place] Married White or Caucasian Not Hispanic or Latino female here for annual exam.  H/O TAH. On ERT, aware of risks, wants to stay on it. She has an allergy to adhesive and can't use the patch. She has felt miserable when she has cut back, aware of the risk. It's a quality of life issue.  Sexually active, no pain.  Period Cycle (Days): (Postmenopausal)   H/O DM, last HgA1C was 7.2   No LMP recorded. Patient is postmenopausal.          Sexually active: Yes.    The current method of family planning is post menopausal status.    Exercising: No.  The patient does not participate in regular exercise at present. Smoker:  no  Health Maintenance: Pap:  2015 WNL per patient  History of abnormal Pap:  Yes- Cervical Cone biopsy  MMG:  04/10/2017 Birads 2 benign Colonoscopy:  Cologuard 8/19 BMD:   04/05/2016 WNL  TDaP:  04/21/2013 Gardasil: N/A   reports that she quit smoking about 36 years ago. Her smoking use included cigarettes. She has a 11.00 pack-year smoking history. She has never used smokeless tobacco. She reports that she drinks about 1.0 - 2.0 standard drinks of alcohol per week. She reports that she does not use drugs. Retired, moved here in 3/18 from [place]. Lives in [place].   Past Medical History:  Diagnosis Date  . Anxiety   . Cancer ([place])    skin  . Cataract   . Diabetes mellitus without complication ([place])   . Fibroid   . GERD (gastroesophageal reflux disease)   . Heart murmur   . Hypercholesterolemia   . Hypertension   . Infertility, female   . Thyroid disease     Past Surgical History:  Procedure Laterality Date  . ABDOMINAL HYSTERECTOMY    . BUNIONECTOMY  1997  . CATARACT EXTRACTION    . CERVICAL CONE BIOPSY  1976  . EYE SURGERY    . GALLBLADDER SURGERY    . MOHS SURGERY  2005/2007  . OOPHORECTOMY    . PILONIDAL CYST EXCISION  1975  . SPINE SURGERY    . TONSILLECTOMY  1957    Current Outpatient Medications  Medication Sig Dispense Refill  .  acetaminophen (TYLENOL) 500 MG tablet Take 500 mg by mouth as needed.    . ALPRAZolam (XANAX) 0.5 MG tablet Take 1 tablet (0.5 mg total) by mouth at bedtime as needed for anxiety. 30 tablet 2  . Biotin 10000 MCG TABS Take 1 tablet by mouth daily as needed.    . diphenhydrAMINE (BENADRYL) 25 MG tablet Take 25 mg by mouth as needed.    . Dulaglutide (TRULICITY) 1.5 VF/6.4PP SOPN Inject 1.5 mg into the skin once a week. 2 mL 4  . estradiol (ESTRACE) 1 MG tablet Take 1 tablet (1 mg total) by mouth daily. 90 tablet 3  . glimepiride (AMARYL) 2 MG tablet TAKE 1 TABLET(2 MG) BY MOUTH FOUR TIMES DAILY (Patient taking differently: 1 tab bid) 120 tablet 5  . levothyroxine (SYNTHROID, LEVOTHROID) 100 MCG tablet TAKE 1 TABLET(100 MCG) BY MOUTH DAILY BEFORE BREAKFAST 90 tablet 0  . loperamide (IMODIUM A-D) 2 MG tablet Take 2 mg by mouth as needed for diarrhea or loose stools.    Marland Kitchen losartan-hydrochlorothiazide (HYZAAR) 100-12.5 MG tablet TAKE 1 TABLET BY MOUTH DAILY 90 tablet 0  . metFORMIN (GLUCOPHAGE-XR) 500 MG 24 hr tablet TAKE 1 TABLET(500 MG) BY MOUTH THREE TIMES DAILY 270 tablet 1  .  pantoprazole (PROTONIX) 40 MG tablet TAKE 1 TABLET(40 MG) BY MOUTH DAILY 30 tablet 5  . rosuvastatin (CRESTOR) 20 MG tablet Take 1 tablet (20 mg total) by mouth daily. 90 tablet 3  . senna (SENOKOT) 8.6 MG tablet Take 1 tablet by mouth as needed for constipation.     No current facility-administered medications for this visit.     Family History  Problem Relation Age of Onset  . Stroke Mother   . Heart disease Father   . Hyperlipidemia Father   . Hypertension Father   . Hypertension Sister   . Heart disease Paternal Grandmother   . Hypertension Paternal Grandmother   . Diabetes Brother   . Hypertension Brother   . Diabetes Brother     Review of Systems  Constitutional: Negative.   HENT: Negative.   Eyes: Negative.   Respiratory: Negative.   Cardiovascular: Negative.   Gastrointestinal: Positive for  constipation.  Endocrine: Negative.   Genitourinary: Positive for frequency.  Musculoskeletal: Negative.   Skin: Negative.   Allergic/Immunologic: Negative.   Neurological: Negative.   Hematological: Negative.   Psychiatric/Behavioral: Negative.   All other systems reviewed and are negative. BM every 1-3 days, takes senna prn No change in urinary frequency, voids normal amounts. No pain. Occasional leakage with a sneeze with a full bladder on the way to the bathroom.   Exam:   There were no vitals taken for this visit.  Weight change: @WEIGHTCHANGE @ Height:      Ht Readings from Last 3 Encounters:  10/09/17 5\' 6"  (1.676 m)  07/10/17 5\' 6"  (1.676 m)  01/16/17 5\' 6"  (1.676 m)    General appearance: alert, cooperative and appears stated age Head: Normocephalic, without obvious abnormality, atraumatic Neck: no adenopathy, supple, symmetrical, trachea midline and thyroid normal to inspection and palpation Lungs: clear to auscultation bilaterally Cardiovascular: regular rate and rhythm Breasts: normal appearance, no masses or tenderness Abdomen: soft, non-tender; non distended,  no masses,  no organomegaly Extremities: extremities normal, atraumatic, no cyanosis or edema Skin: Skin color, texture, turgor normal. No rashes or lesions Lymph nodes: Cervical, supraclavicular, and axillary nodes normal. No abnormal inguinal nodes palpated Neurologic: Grossly normal   Pelvic: External genitalia:  no lesions              Urethra:  normal appearing urethra with no masses, tenderness or lesions              Bartholins and Skenes: normal                 Vagina: normal appearing vagina with normal color and discharge, no lesions              Cervix: absent               Bimanual Exam:  Uterus:  uterus absent              Adnexa: no mass, fullness, tenderness               Rectovaginal: Confirms               Anus:  normal sphincter tone, no lesions  Chaperone was present for exam.  A:   Well Woman with normal exam  On ERT, feels miserable off it or even on a lower dose  P:   Pap next year  Continue ERT, aware of risks  DEXA, mammogram and colon cancer screening are UTD  Discussed breast self exam  Discussed calcium and vit D  intake

## 2017-12-27 ENCOUNTER — Ambulatory Visit (INDEPENDENT_AMBULATORY_CARE_PROVIDER_SITE_OTHER): Payer: Medicare Other | Admitting: Obstetrics and Gynecology

## 2017-12-27 ENCOUNTER — Encounter: Payer: Self-pay | Admitting: Obstetrics and Gynecology

## 2017-12-27 VITALS — BP 140/68 | HR 101 | Ht 65.75 in | Wt 220.0 lb

## 2017-12-27 DIAGNOSIS — Z79890 Hormone replacement therapy (postmenopausal): Secondary | ICD-10-CM | POA: Diagnosis not present

## 2017-12-27 DIAGNOSIS — Z01419 Encounter for gynecological examination (general) (routine) without abnormal findings: Secondary | ICD-10-CM

## 2017-12-27 DIAGNOSIS — Z5181 Encounter for therapeutic drug level monitoring: Secondary | ICD-10-CM | POA: Diagnosis not present

## 2017-12-27 MED ORDER — ESTRADIOL 1 MG PO TABS: 1.0000 mg | ORAL_TABLET | Freq: Every day | ORAL | 3 refills | Status: DC

## 2017-12-27 NOTE — Patient Instructions (Addendum)
EXERCISE AND DIET:  We recommended that you start or continue a regular exercise program for good health. Regular exercise means any activity that makes your heart beat faster and makes you sweat.  We recommend exercising at least 30 minutes per day at least 3 days a week, preferably 4 or 5.  We also recommend a diet low in fat and sugar.  Inactivity, poor dietary choices and obesity can cause diabetes, heart attack, stroke, and kidney damage, among others.    ALCOHOL AND SMOKING:  Women should limit their alcohol intake to no more than 7 drinks/beers/glasses of wine (combined, not each!) per week. Moderation of alcohol intake to this level decreases your risk of breast cancer and liver damage. And of course, no recreational drugs are part of a healthy lifestyle.  And absolutely no smoking or even second hand smoke. Most people know smoking can cause heart and lung diseases, but did you know it also contributes to weakening of your bones? Aging of your skin?  Yellowing of your teeth and nails?  CALCIUM AND VITAMIN D:  Adequate intake of calcium and Vitamin D are recommended.  The recommendations for exact amounts of these supplements seem to change often, but generally speaking 600 mg of calcium (either carbonate or citrate) and 800 units of Vitamin D per day seems prudent. Certain women may benefit from higher intake of Vitamin D.  If you are among these women, your doctor will have told you during your visit.    PAP SMEARS:  Pap smears, to check for cervical cancer or precancers,  have traditionally been done yearly, although recent scientific advances have shown that most women can have pap smears less often.  However, every woman still should have a physical exam from her gynecologist every year. It will include a breast check, inspection of the vulva and vagina to check for abnormal growths or skin changes, a visual exam of the cervix, and then an exam to evaluate the size and shape of the uterus and  ovaries.  And after 70 years of age, a rectal exam is indicated to check for rectal cancers. We will also provide age appropriate advice regarding health maintenance, like when you should have certain vaccines, screening for sexually transmitted diseases, bone density testing, colonoscopy, mammograms, etc.   MAMMOGRAMS:  All women over 40 years old should have a yearly mammogram. Many facilities now offer a "3D" mammogram, which may cost around $50 extra out of pocket. If possible,  we recommend you accept the option to have the 3D mammogram performed.  It both reduces the number of women who will be called back for extra views which then turn out to be normal, and it is better than the routine mammogram at detecting truly abnormal areas.    COLONOSCOPY:  Colonoscopy to screen for colon cancer is recommended for all women at age 50.  We know, you hate the idea of the prep.  We agree, BUT, having colon cancer and not knowing it is worse!!  Colon cancer so often starts as a polyp that can be seen and removed at colonscopy, which can quite literally save your life!  And if your first colonoscopy is normal and you have no family history of colon cancer, most women don't have to have it again for 10 years.  Once every ten years, you can do something that may end up saving your life, right?  We will be happy to help you get it scheduled when you are ready.    Be sure to check your insurance coverage so you understand how much it will cost.  It may be covered as a preventative service at no cost, but you should check your particular policy.      Menopause and Hormone Replacement Therapy What is hormone replacement therapy? Hormone replacement therapy (HRT) is the use of artificial (synthetic) hormones to replace hormones that your body stops producing during menopause. Menopause is the normal time of life when menstrual periods stop completely and the ovaries stop producing the female hormones estrogen and  progesterone. This lack of hormones can affect your health and cause undesirable symptoms. HRT can relieve some of those symptoms. What are my options for HRT? HRT may consist of the synthetic hormones estrogen and progestin, or it may consist of only estrogen (estrogen-only therapy). You and your health care provider will decide which form of HRT is best for you. If you choose to be on HRT and you have a uterus, estrogen and progestin are usually prescribed. Estrogen-only therapy is used for women who do not have a uterus. Possible options for taking HRT include:  Pills.  Patches.  Gels.  Sprays.  Vaginal cream.  Vaginal rings.  Vaginal inserts.  The amount of hormone(s) that you take and how long you take the hormone(s) varies depending on your individual health. It is important to:  Begin HRT with the lowest possible dosage.  Stop HRT as soon as your health care provider tells you to stop.  Work with your health care provider so that you feel informed and comfortable with your decisions.  What are the benefits of HRT? HRT can reduce the frequency and severity of menopausal symptoms. Benefits of HRT vary depending on the menopausal symptoms that you have, the severity of your symptoms, and your overall health. HRT may help to improve the following menopausal symptoms:  Hot flashes and night sweats. These are sudden feelings of heat that spread over the face and body. The skin may turn red, like a blush. Night sweats are hot flashes that happen while you are sleeping or trying to sleep.  Bone loss (osteoporosis). The body loses calcium more quickly after menopause, causing the bones to become weaker. This can increase the risk for bone breaks (fractures).  Vaginal dryness. The lining of the vagina can become thin and dry, which can cause pain during sexual intercourse or cause infection, burning, or itching.  Urinary tract infections.  Urinary incontinence. This is a  decreased ability to control when you urinate.  Irritability.  Short-term memory problems.  What are the risks of HRT? Risks of HRT vary depending on your individual health and medical history. Risks of HRT also depend on whether you receive both estrogen and progestin or you receive estrogen only.HRT may increase the risk of:  Spotting. This is when a small amount of bloodleaks from the vagina unexpectedly.  Endometrial cancer. This cancer is in the lining of the uterus (endometrium).  Breast cancer.  Increased density of breast tissue. This can make it harder to find breast cancer on a breast X-ray (mammogram).  Stroke.  Heart attack.  Blood clots.  Gallbladder disease.  Risks of HRT can increase if you have any of the following conditions:  Endometrial cancer.  Liver disease.  Heart disease.  Breast cancer.  History of blood clots.  History of stroke.  How should I care for myself while I am on HRT?  Take over-the-counter and prescription medicines only as told by your health care   provider.  Get mammograms, pelvic exams, and medical checkups as often as told by your health care provider.  Have Pap tests done as often as told by your health care provider. A Pap test is sometimes called a Pap smear. It is a screening test that is used to check for signs of cancer of the cervix and vagina. A Pap test can also identify the presence of infection or precancerous changes. Pap tests may be done: ? Every 3 years, starting at age 21. ? Every 5 years, starting after age 30, in combination with testing for human papillomavirus (HPV). ? More often or less often depending on other medical conditions you have, your age, and other risk factors.  It is your responsibility to get your Pap test results. Ask your health care provider or the department performing the test when your results will be ready.  Keep all follow-up visits as told by your health care provider. This is  important. When should I seek medical care? Talk with your health care provider if:  You have any of these: ? Pain or swelling in your legs. ? Shortness of breath. ? Chest pain. ? Lumps or changes in your breasts or armpits. ? Slurred speech. ? Pain, burning, or bleeding when you urine.  You develop any of these: ? Unusual vaginal bleeding. ? Dizziness or headaches. ? Weakness or numbness in any part of your arms or legs. ? Pain in your abdomen.  This information is not intended to replace advice given to you by your health care provider. Make sure you discuss any questions you have with your health care provider. Document Released: 12/03/2002 Document Revised: 02/01/2016 Document Reviewed: 09/07/2014 Elsevier Interactive Patient Education  2017 Elsevier Inc.  

## 2018-01-03 ENCOUNTER — Other Ambulatory Visit: Payer: Self-pay | Admitting: Family Medicine

## 2018-01-03 DIAGNOSIS — E039 Hypothyroidism, unspecified: Secondary | ICD-10-CM

## 2018-01-09 ENCOUNTER — Encounter: Payer: Self-pay | Admitting: Family Medicine

## 2018-01-09 ENCOUNTER — Ambulatory Visit: Payer: Medicare Other | Admitting: Family Medicine

## 2018-01-09 VITALS — BP 128/76 | HR 92 | Temp 98.1°F | Ht 65.75 in | Wt 219.2 lb

## 2018-01-09 DIAGNOSIS — I152 Hypertension secondary to endocrine disorders: Secondary | ICD-10-CM

## 2018-01-09 DIAGNOSIS — E119 Type 2 diabetes mellitus without complications: Secondary | ICD-10-CM | POA: Diagnosis not present

## 2018-01-09 DIAGNOSIS — E1159 Type 2 diabetes mellitus with other circulatory complications: Secondary | ICD-10-CM

## 2018-01-09 DIAGNOSIS — I10 Essential (primary) hypertension: Secondary | ICD-10-CM

## 2018-01-09 DIAGNOSIS — M720 Palmar fascial fibromatosis [Dupuytren]: Secondary | ICD-10-CM | POA: Diagnosis not present

## 2018-01-09 DIAGNOSIS — E1169 Type 2 diabetes mellitus with other specified complication: Secondary | ICD-10-CM

## 2018-01-09 DIAGNOSIS — E785 Hyperlipidemia, unspecified: Secondary | ICD-10-CM

## 2018-01-09 LAB — POCT GLYCOSYLATED HEMOGLOBIN (HGB A1C): Hemoglobin A1C: 7.3 % — AB (ref 4.0–5.6)

## 2018-01-09 MED ORDER — ESOMEPRAZOLE MAGNESIUM 40 MG PO CPDR: 40.0000 mg | DELAYED_RELEASE_CAPSULE | Freq: Every day | ORAL | 3 refills | Status: DC

## 2018-01-09 NOTE — Addendum Note (Signed)
Addended by: Durwin Glaze on: 01/09/2018 12:01 PM   Modules accepted: Orders

## 2018-01-09 NOTE — Progress Notes (Signed)
Brenda Griffin is a 72 y.o. female is here for follow up.  History of Present Illness:   HPI:   1. Nodule of palm of right hand. New. Not painful. No contracture. No trauma.    2. Type 2 diabetes mellitus without complication, without long-term current use of insulin ([place]).  Current symptoms: no polyuria or polydipsia, no chest pain, dyspnea or TIA's, no numbness, tingling or pain in extremities.   Lab Results  Component Value Date   HGBA1C 7.3 (A) 01/09/2018    Lab Results  Component Value Date   MICROALBUR <0.7 10/09/2017    Lab Results  Component Value Date   CHOL 174 10/09/2017   HDL 45.00 10/09/2017   LDLDIRECT 82.0 10/09/2017   TRIG 307.0 (H) 10/09/2017   CHOLHDL 4 10/09/2017     Wt Readings from Last 3 Encounters:  01/09/18 219 lb 3.2 oz (99.4 kg)  12/27/17 220 lb (99.8 kg)  10/09/17 218 lb 3.2 oz (99 kg)   BP Readings from Last 3 Encounters:  01/09/18 128/76  12/27/17 140/68  10/09/17 136/84   Lab Results  Component Value Date   CREATININE 0.84 10/09/2017     3. Hypertension associated with diabetes ([place]).   Review: taking medications as instructed, no medication side effects noted, no TIAs, no chest pain on exertion, no dyspnea on exertion, no swelling of ankles.   Wt Readings from Last 3 Encounters:  01/09/18 219 lb 3.2 oz (99.4 kg)  12/27/17 220 lb (99.8 kg)  10/09/17 218 lb 3.2 oz (99 kg)   BP Readings from Last 3 Encounters:  01/09/18 128/76  12/27/17 140/68  10/09/17 136/84   Lab Results  Component Value Date   CREATININE 0.84 10/09/2017     4. Hyperlipidemia associated with type 2 diabetes mellitus ([place]).   Lab Results  Component Value Date   CHOL 174 10/09/2017   HDL 45.00 10/09/2017   LDLDIRECT 82.0 10/09/2017   TRIG 307.0 (H) 10/09/2017   CHOLHDL 4 10/09/2017     5. Morbid obesity ([place]).   Wt Readings from Last 3 Encounters:  01/09/18 219 lb 3.2 oz (99.4 kg)  12/27/17 220 lb (99.8 kg)  10/09/17 218 lb 3.2 oz (99 kg)      Health Maintenance Due  Topic Date Due  . INFLUENZA VACCINE  10/18/2017   Depression screen [facility] 2/9 10/09/2017 01/16/2017 10/11/2016  Decreased Interest 0 0 0  Down, Depressed, Hopeless 0 1 0  PHQ - 2 Score 0 1 0  Altered sleeping 3 - -  Tired, decreased energy 0 - -  Change in appetite 0 - -  Feeling bad or failure about yourself  0 - -  Trouble concentrating 0 - -  Moving slowly or fidgety/restless 0 - -  Suicidal thoughts 0 - -  PHQ-9 Score 3 - -  Difficult doing work/chores Not difficult at all - -   PMHx, SurgHx, SocialHx, FamHx, Medications, and Allergies were reviewed in the Visit Navigator and updated as appropriate.   Patient Active Problem List   Diagnosis Date Noted  . Diabetic polyneuropathy associated with type 2 diabetes mellitus ([place]) 04/15/2017  . Hormone replacement therapy 10/11/2016  . Cluster headaches 10/11/2016  . Type 2 diabetes mellitus without complication, without long-term current use of insulin ([place]) 10/11/2016  . Hypothyroidism 10/11/2016  . Hypertension associated with diabetes ([place]) 10/11/2016  . History of skin cancer 10/11/2016  . Hyperlipidemia associated with type 2 diabetes mellitus ([place]) 10/11/2016  . Anxiety 10/11/2016  .  Morbid obesity ([place]) 10/11/2016   Social History   Tobacco Use  . Smoking status: Former Smoker    Packs/day: 1.00    Years: 11.00    Pack years: 11.00    Types: Cigarettes    Last attempt to quit: 03/20/1981    Years since quitting: 36.8  . Smokeless tobacco: Never Used  Substance Use Topics  . Alcohol use: Yes    Alcohol/week: 1.0 - 2.0 standard drinks    Types: 1 - 2 Glasses of wine per week  . Drug use: No   Current Medications and Allergies:   .  acetaminophen (TYLENOL) 500 MG tablet, Take 500 mg by mouth as needed., Disp: , Rfl:  .  ALPRAZolam (XANAX) 0.5 MG tablet, Take 1 tablet (0.5 mg total) by mouth at bedtime as needed for anxiety., Disp: 30 tablet, Rfl: 2 .  Biotin 10000 MCG TABS, Take 1 tablet  by mouth daily as needed., Disp: , Rfl:  .  diphenhydrAMINE (BENADRYL) 25 MG tablet, Take 25 mg by mouth as needed., Disp: , Rfl:  .  Dulaglutide (TRULICITY) 1.5 UU/7.2ZD SOPN, Inject 1.5 mg into the skin once a week., Disp: 2 mL, Rfl: 4 .  estradiol (ESTRACE) 1 MG tablet, Take 1 tablet (1 mg total) by mouth daily., Disp: 90 tablet, Rfl: 3 .  glimepiride (AMARYL) 2 MG tablet, TAKE 1 TABLET(2 MG) BY MOUTH FOUR TIMES DAILY (Patient taking differently: 1 tab bid), Disp: 120 tablet, Rfl: 5 .  levothyroxine (SYNTHROID, LEVOTHROID) 100 MCG tablet, TAKE 1 TABLET(100 MCG) BY MOUTH DAILY BEFORE BREAKFAST, Disp: 90 tablet, Rfl: 0 .  loperamide (IMODIUM A-D) 2 MG tablet, Take 2 mg by mouth as needed for diarrhea or loose stools., Disp: , Rfl:  .  losartan-hydrochlorothiazide (HYZAAR) 100-12.5 MG tablet, TAKE 1 TABLET BY MOUTH DAILY, Disp: 90 tablet, Rfl: 0 .  metFORMIN (GLUCOPHAGE-XR) 500 MG 24 hr tablet, TAKE 1 TABLET(500 MG) BY MOUTH THREE TIMES DAILY, Disp: 270 tablet, Rfl: 1 .  pantoprazole (PROTONIX) 40 MG tablet, TAKE 1 TABLET(40 MG) BY MOUTH DAILY, Disp: 30 tablet, Rfl: 5 .  rosuvastatin (CRESTOR) 20 MG tablet, Take 1 tablet (20 mg total) by mouth daily., Disp: 90 tablet, Rfl: 3 .  senna (SENOKOT) 8.6 MG tablet, Take 1 tablet by mouth as needed for constipation., Disp: , Rfl:    Allergies  Allergen Reactions  . Bacitracin Rash  . Benzoic Acid Rash  . Benzyl Salicylate Rash  . Formaldehyde Rash  . Geraniol Rash  . Hydroxyethyl Methacrylate Rash  . Methyl Methacrylate [Methylmethacrylate] Rash  . Other Rash    Amyl Cinnamaldehyde  . Parabens Hives  . Adhesive [Tape] Itching  . Aspirin Nausea And Vomiting  . Codeine Nausea And Vomiting  . Victoza [Liraglutide] Diarrhea  . Butanediol [Butylene Glycol] Rash   Review of Systems   Pertinent items are noted in the HPI. Otherwise, ROS is negative.  Vitals:   Vitals:   01/09/18 0921  BP: 128/76  Pulse: 92  Temp: 98.1 F (36.7 C)   TempSrc: Oral  SpO2: 96%  Weight: 219 lb 3.2 oz (99.4 kg)  Height: 5' 5.75" (1.67 m)     Body mass index is 35.65 kg/m.  Physical Exam:   Physical Exam   Results for orders placed or performed in visit on 01/09/18  POCT glycosylated hemoglobin (Hb A1C)  Result Value Ref Range   Hemoglobin A1C 7.3 (A) 4.0 - 5.6 %   HbA1c POC (<> result, manual entry)  HbA1c, POC (prediabetic range)     HbA1c, POC (controlled diabetic range)     Assessment and Plan:   Diagnoses and all orders for this visit:  Dupuytren's disease of palm of right hand  Type 2 diabetes mellitus without complication, without long-term current use of insulin ([facility]) -     POCT glycosylated hemoglobin (Hb A1C)  Hypertension associated with diabetes ([place])  Hyperlipidemia associated with type 2 diabetes mellitus ([place])  Morbid obesity ([place])   Recommendations: 1.  Patient is counseled on appropriate foot care. 2.  BP goal < 130/80. 3.  LDL goal of < 100, HDL > 40 and TG < 150.  4.  Eye Exam yearly and Dental Exam every 6 months. 5.  Dietary recommendations:  < 100 carbohydrates daily. 6.  Physical Activity recommendations:  150 minutes per week. 7.  Pneumovax at diagnosis and once 65+. Wait five years between first dose and dose after 65.  8.  Influenza annually.   Diabetes Health Maintenance Due  Topic Date Due  . FOOT EXAM  01/23/2018  . HEMOGLOBIN A1C  04/11/2018  . OPHTHALMOLOGY EXAM  05/30/2018   Diabetes QM Metrics Latest Ref Rng & Units 01/09/2018 10/09/2017 07/10/2017 05/29/2017 05/29/2017  HbA1c 4.0 - 5.6 % 7.3(A) 7.1(A) 6.2 - -  Microalb 0.0 - 1.9 mg/dL - <0.7 - - -  Micro/Creat Ratio 0.0 - 30.0 mg/g - 0.7 - - -  LDL Direct mg/dL - 82.0 - - -  Eye Exam No Retinopathy - - - No Retinopathy No Retinopathy   BP& BMI  01/09/2018 12/27/2017 10/09/2017 07/10/2017 04/12/2017  BP 128/76 140/68 136/84 132/84 124/76  BMI 35.65 35.78 35.22 36.15 37.7    . Reviewed expectations re: course of current  medical issues. . Discussed self-management of symptoms. . Outlined signs and symptoms indicating need for more acute intervention. . Patient verbalized understanding and all questions were answered. Marland Kitchen Health Maintenance issues including appropriate healthy diet, exercise, and smoking avoidance were discussed with patient. . See orders for this visit as documented in the electronic medical record. . Patient received an After Visit Summary.  Briscoe Deutscher, DO [place], [place] [facility] 01/09/2018

## 2018-01-23 ENCOUNTER — Other Ambulatory Visit: Payer: Medicare Other

## 2018-01-23 ENCOUNTER — Ambulatory Visit: Payer: Medicare Other

## 2018-01-30 ENCOUNTER — Ambulatory Visit: Payer: Medicare Other | Admitting: Family Medicine

## 2018-01-31 ENCOUNTER — Other Ambulatory Visit: Payer: Self-pay | Admitting: Family Medicine

## 2018-01-31 MED ORDER — GLIMEPIRIDE 2 MG PO TABS: 2.0000 mg | ORAL_TABLET | Freq: Two times a day (BID) | ORAL | 3 refills | Status: DC

## 2018-01-31 NOTE — Telephone Encounter (Signed)
Copied from [place] [phone]. Topic: Quick Communication - Rx Refill/Question >> Jan 31, 2018  8:40 AM Sheran Luz wrote: Medication: glimepiride (AMARYL) 2 MG tablet   Patient is requesting refill of this medication.   Has the patient contacted their pharmacy? Yes, Pharmacy advised patient to contact office as RX expired  Preferred Pharmacy (with phone number or street name): [id number] - [place], [place] - [name] AT [place] [phone] (Phone) [phone] (Fax)

## 2018-01-31 NOTE — Telephone Encounter (Signed)
Requested medication (s) are due for refill today: Pt taking differently,   Requested medication (s) are on the active medication list: yes    Last refill: 07/17/17  120  5 refills  Future visit scheduled yes  Notes to clinic:Pt taking differently. Taking 1 tab BID  Requested Prescriptions  Pending Prescriptions Disp Refills   glimepiride (AMARYL) 2 MG tablet 120 tablet 5     Endocrinology:  Diabetes - Sulfonylureas Passed - 01/31/2018  9:24 AM      Passed - HBA1C is between 0 and 7.9 and within 180 days    Hemoglobin A1C  Date Value Ref Range Status  01/09/2018 7.3 (A) 4.0 - 5.6 % Final   Hgb A1c MFr Bld  Date Value Ref Range Status  04/12/2017 7.2 (H) 4.6 - 6.5 % Final    Comment:    Glycemic Control Guidelines for People with Diabetes:Non Diabetic:  <6%Goal of Therapy: <7%Additional Action Suggested:  >8%          Passed - Valid encounter within last 6 months    Recent Outpatient Visits          3 weeks ago Dupuytren's disease of palm of right hand   [place] Wallace, [place], DO   3 months ago Type 2 diabetes mellitus without complication, without long-term current use of insulin ([place])   [place] Wallace, [place], DO   6 months ago Essential hypertension   [place] Wallace, [place], [place]   9 months ago Acquired hypothyroidism   [place] Wallace, [place], DO   1 year ago Type 2 diabetes mellitus without complication, without long-term current use of insulin ([place])   [place] Wallace, [place], DO      Future Appointments            In 2 months Briscoe Deutscher, [place], [facility]

## 2018-02-21 ENCOUNTER — Other Ambulatory Visit: Payer: Self-pay | Admitting: Family Medicine

## 2018-02-21 MED ORDER — LOSARTAN POTASSIUM-HCTZ 100-12.5 MG PO TABS: 1.0000 | ORAL_TABLET | Freq: Every day | ORAL | 0 refills | Status: DC

## 2018-04-02 ENCOUNTER — Other Ambulatory Visit: Payer: Self-pay | Admitting: Family Medicine

## 2018-04-02 DIAGNOSIS — E039 Hypothyroidism, unspecified: Secondary | ICD-10-CM

## 2018-04-04 ENCOUNTER — Other Ambulatory Visit: Payer: Self-pay | Admitting: Family Medicine

## 2018-04-08 ENCOUNTER — Other Ambulatory Visit: Payer: Self-pay | Admitting: Family Medicine

## 2018-04-08 DIAGNOSIS — E119 Type 2 diabetes mellitus without complications: Secondary | ICD-10-CM

## 2018-04-11 DIAGNOSIS — Z1231 Encounter for screening mammogram for malignant neoplasm of breast: Secondary | ICD-10-CM | POA: Diagnosis not present

## 2018-04-11 LAB — HM MAMMOGRAPHY

## 2018-04-12 ENCOUNTER — Other Ambulatory Visit: Payer: Self-pay | Admitting: Family Medicine

## 2018-04-12 DIAGNOSIS — E119 Type 2 diabetes mellitus without complications: Secondary | ICD-10-CM

## 2018-04-15 NOTE — Progress Notes (Signed)
Brenda Griffin is a 73 y.o. female is here for follow up.  History of Present Illness:   Brenda Griffin, CMA acting as scribe for Dr. Briscoe Deutscher.   HPI:   1. Hypertension associated with diabetes ([place])   Review: taking medications as instructed, no medication side effects noted, no TIAs, no chest pain on exertion, no dyspnea on exertion, no swelling of ankles. Smoker: No. BP Readings from Last 3 Encounters:  04/16/18 126/62  01/09/18 128/76  12/27/17 140/68   Lab Results  Component Value Date   CREATININE 0.84 10/09/2017   CREATININE 0.80 04/12/2017   CREATININE 0.84 10/11/2016      2. Type 2 diabetes mellitus without complication, without long-term current use of insulin (HCC)  Medication compliance: compliant all of the time, diabetic diet compliance: noncompliant some of the time, home glucose monitoring: is performed sporadically, further diabetic ROS: no polyuria or polydipsia, no chest pain, dyspnea or TIA's, no numbness, tingling or pain in extremities.   3. Acquired hypothyroidism  Current symptoms: none. Patient denies change in energy level and weight changes. Symptoms have been well-controlled. Lab Results  Component Value Date   TSH 2.20 04/12/2017   TSH 1.16 10/11/2016   TSH 3.00 03/09/2016   Lab Results  Component Value Date   FREET4 1.54 04/12/2017   FREET4 1.43 10/11/2016        4. Hyperlipidemia associated with type 2 diabetes mellitus ([place])  Is the patient taking medications without problems? No. Does the patient complain of muscle aches?  No. Trying to exercise on a regular basis? No. Compliant with diet? No.  Lab Results  Component Value Date   CHOL 174 10/09/2017   HDL 45.00 10/09/2017   LDLDIRECT 82.0 10/09/2017   TRIG 307.0 (H) 10/09/2017   CHOLHDL 4 10/09/2017   Lab Results  Component Value Date   ALT 14 10/09/2017   AST 15 10/09/2017   ALKPHOS 69 04/12/2017   BILITOT 0.6 10/09/2017      5. Diarrhea. Usually alternates between  diarrhea and constipation. Now, with mostly diarrhea - usually 1-2 times daily, but several episodes yesterday. No change in food or medications. No new exposures. Husband has not had issues. On Metformin. No melena or BRBPR. No fever or abdominal pain. Took Imodium and improved.   6. Morbid obesity ([place]).   Wt Readings from Last 3 Encounters:  04/16/18 220 lb (99.8 kg)  01/09/18 219 lb 3.2 oz (99.4 kg)  12/27/17 220 lb (99.8 kg)    7. Gastroesophageal reflux disease, esophagitis presence not specified    Health Maintenance Due  Topic Date Due  . FOOT EXAM  01/23/2018   Depression screen [facility] 2/9 04/16/2018 10/09/2017 01/16/2017  Decreased Interest 0 0 0  Down, Depressed, Hopeless 0 0 1  PHQ - 2 Score 0 0 1  Altered sleeping 2 3 -  Tired, decreased energy 1 0 -  Change in appetite 2 0 -  Feeling bad or failure about yourself  0 0 -  Trouble concentrating 0 0 -  Moving slowly or fidgety/restless 0 0 -  Suicidal thoughts 0 0 -  PHQ-9 Score 5 3 -  Difficult doing work/chores Not difficult at all Not difficult at all -   PMHx, SurgHx, SocialHx, FamHx, Medications, and Allergies were reviewed in the Visit Navigator and updated as appropriate.   Patient Active Problem List   Diagnosis Date Noted  . GERD (gastroesophageal reflux disease) 04/16/2018  . Diabetic polyneuropathy associated with type 2  diabetes mellitus ([place]) 04/15/2017  . Hormone replacement therapy 10/11/2016  . Cluster headaches 10/11/2016  . Type 2 diabetes mellitus without complication, without long-term current use of insulin ([place]) 10/11/2016  . Hypothyroidism 10/11/2016  . Hypertension associated with diabetes ([place]) 10/11/2016  . History of skin cancer 10/11/2016  . Hyperlipidemia associated with type 2 diabetes mellitus ([place]) 10/11/2016  . Anxiety 10/11/2016  . Morbid obesity ([place]) 10/11/2016   Social History   Tobacco Use  . Smoking status: Former Smoker    Packs/day: 1.00    Years: 11.00    Pack years:  11.00    Types: Cigarettes    Last attempt to quit: 03/20/1981    Years since quitting: 37.0  . Smokeless tobacco: Never Used  Substance Use Topics  . Alcohol use: Yes    Alcohol/week: 1.0 - 2.0 standard drinks    Types: 1 - 2 Glasses of wine per week  . Drug use: No   Current Medications and Allergies   .  acetaminophen (TYLENOL) 500 MG tablet, Take 500 mg by mouth as needed., Disp: , Rfl:  .  ALPRAZolam (XANAX) 0.5 MG tablet, Take 1 tablet (0.5 mg total) by mouth at bedtime as needed for anxiety., Disp: 30 tablet, Rfl: 2 .  Biotin 10000 MCG TABS, Take 1 tablet by mouth daily as needed., Disp: , Rfl:  .  diphenhydrAMINE (BENADRYL) 25 MG tablet, Take 25 mg by mouth as needed., Disp: , Rfl:  .  esomeprazole (NEXIUM) 40 MG capsule, Take 1 capsule (40 mg total) by mouth daily., Disp: 30 capsule, Rfl: 3 .  estradiol (ESTRACE) 1 MG tablet, Take 1 tablet (1 mg total) by mouth daily., Disp: 90 tablet, Rfl: 3 .  glimepiride (AMARYL) 2 MG tablet, Take 1 tablet (2 mg total) by mouth 2 (two) times daily., Disp: 180 tablet, Rfl: 3 .  levothyroxine (SYNTHROID, LEVOTHROID) 100 MCG tablet, TAKE 1 TABLET(100 MCG) BY MOUTH DAILY BEFORE BREAKFAST, Disp: 90 tablet, Rfl: 0 .  loperamide (IMODIUM A-D) 2 MG tablet, Take 2 mg by mouth as needed for diarrhea or loose stools., Disp: , Rfl:  .  losartan-hydrochlorothiazide (HYZAAR) 100-12.5 MG tablet, Take 1 tablet by mouth daily., Disp: 90 tablet, Rfl: 0 .  metFORMIN (GLUCOPHAGE-XR) 500 MG 24 hr tablet, TAKE 1 TABLET(500 MG) BY MOUTH THREE TIMES DAILY, Disp: 270 tablet, Rfl: 1 .  pantoprazole (PROTONIX) 40 MG tablet, TAKE 1 TABLET(40 MG) BY MOUTH DAILY, Disp: 30 tablet, Rfl: 5 .  rosuvastatin (CRESTOR) 20 MG tablet, Take 1 tablet (20 mg total) by mouth daily., Disp: 90 tablet, Rfl: 3 .  senna (SENOKOT) 8.6 MG tablet, Take 1 tablet by mouth as needed for constipation., Disp: , Rfl:  .  TRULICITY 1.5 BJ/4.7WG SOPN, INEJCT 1.5 MG WEEKLY, Disp: 2 mL, Rfl:  4   Allergies  Allergen Reactions  . Bacitracin Rash  . Benzoic Acid Rash  . Benzyl Salicylate Rash  . Formaldehyde Rash  . Geraniol Rash  . Hydroxyethyl Methacrylate Rash  . Methyl Methacrylate [Methylmethacrylate] Rash  . Other Rash    Amyl Cinnamaldehyde  . Parabens Hives  . Adhesive [Tape] Itching  . Aspirin Nausea And Vomiting  . Codeine Nausea And Vomiting  . Victoza [Liraglutide] Diarrhea  . Butanediol [Butylene Glycol] Rash   Review of Systems   Pertinent items are noted in the HPI. Otherwise, a complete ROS is negative.  Vitals   Vitals:   04/16/18 0905  BP: 126/62  Pulse: 74  Temp: 98.4 F (36.9  C)  TempSrc: Oral  SpO2: 95%  Weight: 220 lb (99.8 kg)  Height: 5\' 6"  (1.676 m)     Body mass index is 35.51 kg/m.  Physical Exam   Physical Exam Vitals signs and nursing note reviewed.  HENT:     Head: Normocephalic and atraumatic.  Eyes:     Pupils: Pupils are equal, round, and reactive to light.  Neck:     Musculoskeletal: Normal range of motion and neck supple.  Cardiovascular:     Rate and Rhythm: Normal rate and regular rhythm.     Heart sounds: Normal heart sounds.  Pulmonary:     Effort: Pulmonary effort is normal.  Abdominal:     Palpations: Abdomen is soft.  Skin:    General: Skin is warm.  Psychiatric:        Behavior: Behavior normal.    Diabetic Foot Exam - Simple   Simple Foot Form Diabetic Foot exam was performed with the following findings:  Yes 04/16/2018  9:13 AM  Visual Inspection No deformities, no ulcerations, no other skin breakdown bilaterally:  Yes Sensation Testing Intact to touch and monofilament testing bilaterally:  Yes Pulse Check Posterior Tibialis and Dorsalis pulse intact bilaterally:  Yes Comments Patient does have some numbness in b/l feet not new.     Results for orders placed or performed in visit on 01/09/18  POCT glycosylated hemoglobin (Hb A1C)  Result Value Ref Range   Hemoglobin A1C 7.3 (A) 4.0 -  5.6 %   HbA1c POC (<> result, manual entry)     HbA1c, POC (prediabetic range)     HbA1c, POC (controlled diabetic range)     Assessment and Plan   Brenda Griffin was seen today for follow-up.  Diagnoses and all orders for this visit:  Hypertension associated with diabetes ([place])  Type 2 diabetes mellitus without complication, without long-term current use of insulin (HCC) -     CBC with Differential/Platelet -     Comprehensive metabolic panel -     Hemoglobin A1c -     Ambulatory referral to Ophthalmology  Acquired hypothyroidism -     TSH -     T4, free  Hyperlipidemia associated with type 2 diabetes mellitus ([place]) -     Lipid panel  Diabetic polyneuropathy associated with type 2 diabetes mellitus ([place])  Morbid obesity (HCC)  Gastroesophageal reflux disease, esophagitis presence not specified   . Orders and follow up as documented in [place], reviewed diet, exercise and weight control, cardiovascular risk and specific lipid/LDL goals reviewed, reviewed medications and side effects in detail.  . Reviewed expectations re: course of current medical issues. . Outlined signs and symptoms indicating need for more acute intervention. . Patient verbalized understanding and all questions were answered. . Patient received an After Visit Summary.  CMA served as Education administrator during this visit. History, Physical, and Plan performed by medical provider. The above documentation has been reviewed and is accurate and complete. Briscoe Deutscher, D.O.  Briscoe Deutscher, DO [place], [place] [facility] 04/16/2018

## 2018-04-16 ENCOUNTER — Ambulatory Visit (INDEPENDENT_AMBULATORY_CARE_PROVIDER_SITE_OTHER): Payer: Medicare Other | Admitting: Family Medicine

## 2018-04-16 ENCOUNTER — Encounter: Payer: Self-pay | Admitting: Family Medicine

## 2018-04-16 VITALS — BP 126/62 | HR 74 | Temp 98.4°F | Ht 66.0 in | Wt 220.0 lb

## 2018-04-16 DIAGNOSIS — E1169 Type 2 diabetes mellitus with other specified complication: Secondary | ICD-10-CM | POA: Diagnosis not present

## 2018-04-16 DIAGNOSIS — E1159 Type 2 diabetes mellitus with other circulatory complications: Secondary | ICD-10-CM | POA: Diagnosis not present

## 2018-04-16 DIAGNOSIS — E119 Type 2 diabetes mellitus without complications: Secondary | ICD-10-CM

## 2018-04-16 DIAGNOSIS — R197 Diarrhea, unspecified: Secondary | ICD-10-CM

## 2018-04-16 DIAGNOSIS — I10 Essential (primary) hypertension: Secondary | ICD-10-CM

## 2018-04-16 DIAGNOSIS — E039 Hypothyroidism, unspecified: Secondary | ICD-10-CM | POA: Diagnosis not present

## 2018-04-16 DIAGNOSIS — E785 Hyperlipidemia, unspecified: Secondary | ICD-10-CM | POA: Diagnosis not present

## 2018-04-16 DIAGNOSIS — I152 Hypertension secondary to endocrine disorders: Secondary | ICD-10-CM

## 2018-04-16 DIAGNOSIS — K30 Functional dyspepsia: Secondary | ICD-10-CM | POA: Insufficient documentation

## 2018-04-16 DIAGNOSIS — K219 Gastro-esophageal reflux disease without esophagitis: Secondary | ICD-10-CM | POA: Insufficient documentation

## 2018-04-16 LAB — LIPID PANEL
Cholesterol: 133 mg/dL (ref 0–200)
HDL: 41.6 mg/dL (ref >39.00)
NonHDL: 91.61
Total CHOL/HDL Ratio: 3
Triglycerides: 284 mg/dL — ABNORMAL HIGH (ref 0.0–149.0)
VLDL: 56.8 mg/dL — ABNORMAL HIGH (ref 0.0–40.0)

## 2018-04-16 LAB — CBC WITH DIFFERENTIAL/PLATELET
Basophils Absolute: 0.1 10*3/uL (ref 0.0–0.1)
Basophils Relative: 1.1 % (ref 0.0–3.0)
Eosinophils Absolute: 0.5 10*3/uL (ref 0.0–0.7)
Eosinophils Relative: 5.7 % — ABNORMAL HIGH (ref 0.0–5.0)
HCT: 40.9 % (ref 36.0–46.0)
Hemoglobin: 14 g/dL (ref 12.0–15.0)
Lymphocytes Relative: 21.7 % (ref 12.0–46.0)
Lymphs Abs: 1.9 10*3/uL (ref 0.7–4.0)
MCHC: 34.2 g/dL (ref 30.0–36.0)
MCV: 85 fl (ref 78.0–100.0)
Monocytes Absolute: 0.4 10*3/uL (ref 0.1–1.0)
Monocytes Relative: 4.5 % (ref 3.0–12.0)
Neutro Abs: 5.9 10*3/uL (ref 1.4–7.7)
Neutrophils Relative %: 67 % (ref 43.0–77.0)
Platelets: 344 10*3/uL (ref 150.0–400.0)
RBC: 4.81 Mil/uL (ref 3.87–5.11)
RDW: 14 % (ref 11.5–15.5)
WBC: 8.8 10*3/uL (ref 4.0–10.5)

## 2018-04-16 LAB — COMPREHENSIVE METABOLIC PANEL
ALT: 15 U/L (ref 0–35)
AST: 19 U/L (ref 0–37)
Albumin: 4 g/dL (ref 3.5–5.2)
Alkaline Phosphatase: 62 U/L (ref 39–117)
BUN: 13 mg/dL (ref 6–23)
CO2: 28 mEq/L (ref 19–32)
Calcium: 10 mg/dL (ref 8.4–10.5)
Chloride: 99 mEq/L (ref 96–112)
Creatinine, Ser: 0.97 mg/dL (ref 0.40–1.20)
GFR: 56.72 mL/min — ABNORMAL LOW (ref >60.00)
Glucose, Bld: 189 mg/dL — ABNORMAL HIGH (ref 70–99)
Potassium: 4.4 mEq/L (ref 3.5–5.1)
Sodium: 137 mEq/L (ref 135–145)
Total Bilirubin: 0.5 mg/dL (ref 0.2–1.2)
Total Protein: 6.2 g/dL (ref 6.0–8.3)

## 2018-04-16 LAB — HEMOGLOBIN A1C: Hgb A1c MFr Bld: 8 % — ABNORMAL HIGH (ref 4.6–6.5)

## 2018-04-16 LAB — LDL CHOLESTEROL, DIRECT: Direct LDL: 56 mg/dL

## 2018-04-16 LAB — TSH: TSH: 3.63 u[IU]/mL (ref 0.35–4.50)

## 2018-04-16 LAB — T4, FREE: Free T4: 1.62 ng/dL — ABNORMAL HIGH (ref 0.60–1.60)

## 2018-04-22 ENCOUNTER — Encounter: Payer: Self-pay | Admitting: Family Medicine

## 2018-04-22 DIAGNOSIS — F419 Anxiety disorder, unspecified: Secondary | ICD-10-CM

## 2018-04-22 MED ORDER — ALPRAZOLAM 0.5 MG PO TABS: 0.5000 mg | ORAL_TABLET | Freq: Every evening | ORAL | 2 refills | Status: DC | PRN

## 2018-04-22 NOTE — Telephone Encounter (Signed)
Pt needing refill on Alprazolam. I put in orders for you.

## 2018-05-12 NOTE — Progress Notes (Signed)
[facility] Note  05/13/2018     CHIEF COMPLAINT Patient presents for Diabetic Eye Exam   HISTORY OF PRESENT ILLNESS: Brenda Griffin is a 73 y.o. female who presents to the clinic today for:   HPI    Diabetic Eye Exam    Vision is stable.  Associated Symptoms Floaters.  Negative for Flashes, Blind Spot, Photophobia, Scalp Tenderness, Pain, Glare, Jaw Claudication, Weight Loss, Distortion, Redness, Trauma, Shoulder/Hip pain, Fatigue and Fever.  Diabetes characteristics include Type 2 and taking oral medications.  This started 5 years ago.  Blood sugar level fluctuates.  Last Blood Glucose 159 (2 days ago).  Last A1C 8 (3 weeks ago (up from 7.2)).  Associated Diagnosis Neuropathy.  I, the attending physician,  performed the HPI with the patient and updated documentation appropriately.          Comments    Patient referred for diabetic retinal eval. Reports no problems with vision and no history of diabetic retinopathy. Last A1c was 8, this was up from low 7's at previous check.        Last edited by Bernarda Caffey, MD on 05/13/2018  2:23 PM. 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 Briscoe Deutscher for DM exam LEE:  Ocular Hx-cataracts PMH-anxiety, DM (N8G: 8.0, takes trulicity, metformin, glimepiride), heart murmur, HLD, HTN, skin cancer, thyroid disease    CURRENT MEDICATIONS: No current  outpatient medications on file. (Ophthalmic Drugs)   No current facility-administered medications for this visit.  (Ophthalmic Drugs)   Current Outpatient Medications (Other)  Medication Sig  . acetaminophen (TYLENOL) 500 MG tablet Take 500 mg by mouth as needed.  . ALPRAZolam (XANAX) 0.5 MG tablet Take 1 tablet (0.5 mg total) by mouth at bedtime as needed for anxiety.  . Biotin 10000 MCG TABS Take 1 tablet by mouth daily as needed.  Marland Kitchen estradiol (ESTRACE) 1 MG tablet Take 1 tablet (1 mg total) by mouth daily.  Marland Kitchen glimepiride (AMARYL) 2 MG tablet Take 1 tablet (2 mg total) by mouth 2 (two) times daily.  Marland Kitchen levothyroxine (SYNTHROID, LEVOTHROID) 100 MCG tablet TAKE 1 TABLET(100 MCG) BY MOUTH DAILY BEFORE BREAKFAST  . loperamide (IMODIUM A-D) 2 MG tablet Take 2 mg by mouth as needed for diarrhea or loose stools.  Marland Kitchen losartan-hydrochlorothiazide (HYZAAR) 100-12.5 MG tablet Take 1 tablet by mouth daily.  . metFORMIN (GLUCOPHAGE-XR) 500 MG 24 hr tablet TAKE 1 TABLET(500 MG) BY MOUTH THREE TIMES DAILY  . pantoprazole (PROTONIX) 40 MG tablet TAKE 1 TABLET(40 MG) BY MOUTH DAILY  . rosuvastatin (CRESTOR) 20 MG tablet Take 1 tablet (20 mg total) by mouth daily.  Marland Kitchen senna (SENOKOT) 8.6 MG tablet Take 1 tablet by mouth as needed for constipation.  . TRULICITY 1.5 NF/6.2ZH SOPN INEJCT 1.5 MG WEEKLY  . diphenhydrAMINE (BENADRYL) 25 MG tablet Take 25 mg by mouth as needed.   No  current facility-administered medications for this visit.  (Other)      REVIEW OF SYSTEMS: ROS    Positive for: Gastrointestinal, Endocrine, Eyes   Negative for: Constitutional, Neurological, Skin, Genitourinary, Musculoskeletal, HENT, Cardiovascular, Respiratory, Psychiatric, Allergic/Imm, Heme/Lymph   Last edited by Roselee Nova D on 05/13/2018  1:26 PM. (History)       ALLERGIES Allergies  Allergen Reactions  . Bacitracin Rash  . Benzoic Acid Rash  . Benzyl Salicylate Rash  . Formaldehyde Rash  . Geraniol Rash  .  Hydroxyethyl Methacrylate Rash  . Methyl Methacrylate [Methylmethacrylate] Rash  . Other Rash    Amyl Cinnamaldehyde  . Parabens Hives  . Adhesive [Tape] Itching  . Aspirin Nausea And Vomiting  . Codeine Nausea And Vomiting  . Victoza [Liraglutide] Diarrhea  . Butanediol [Butylene Glycol] Rash    PAST MEDICAL HISTORY Past Medical History:  Diagnosis Date  . Anxiety   . Cataract   . Diabetes mellitus without complication ([place])   . Fibroid   . GERD (gastroesophageal reflux disease)   . Heart murmur   . Hypercholesterolemia   . Hypertension   . Infertility, female   . Skin cancer   . Thyroid disease    Past Surgical History:  Procedure Laterality Date  . ABDOMINAL HYSTERECTOMY    . BUNIONECTOMY  1997  . CATARACT EXTRACTION Bilateral 2006  . CERVICAL CONE BIOPSY  1976  . EYE SURGERY    . GALLBLADDER SURGERY    . MOHS SURGERY  2005/2007  . OOPHORECTOMY    . PILONIDAL CYST EXCISION  1975  . SPINE SURGERY    . TONSILLECTOMY  1957    FAMILY HISTORY Family History  Problem Relation Age of Onset  . Stroke Mother   . Heart disease Father   . Hyperlipidemia Father   . Hypertension Father   . Hypertension Sister   . Heart disease Paternal Grandmother   . Hypertension Paternal Grandmother   . Diabetes Brother   . Hypertension Brother   . Diabetes Brother     SOCIAL HISTORY Social History   Tobacco Use  . Smoking status: Former Smoker    Packs/day: 1.00    Years: 11.00    Pack years: 11.00    Types: Cigarettes    Last attempt to quit: 03/20/1981    Years since quitting: 37.1  . Smokeless tobacco: Never Used  Substance Use Topics  . Alcohol use: Yes    Alcohol/week: 1.0 - 2.0 standard drinks    Types: 1 - 2 Glasses of wine per week  . Drug use: No         OPHTHALMIC EXAM:  Base Eye Exam    Visual Acuity (Snellen - Linear)      Right Left   Dist [place] 20/30 20/25 -1   Dist ph [place] 20/25 NI       Tonometry (Tonopen, 1:37 PM)      Right Left   Pressure  17 16       Pupils      Dark Light Shape React APD   Right 4 3 Round Brisk None   Left 4 3 Round Brisk None       Visual Fields (Counting fingers)      Left Right    Full Full       Extraocular Movement      Right Left    Full, Ortho Full, Ortho       Neuro/Psych    Oriented x3:  Yes  Mood/Affect:  Normal       Dilation    Both eyes:  1.0% Mydriacyl, 2.5% Phenylephrine @ 1:37 PM        Slit Lamp and Fundus Exam    Slit Lamp Exam      Right Left   Lids/Lashes Dermatochalasis - upper lid, Meibomian gland dysfunction Dermatochalasis - upper lid, Meibomian gland dysfunction   Conjunctiva/Sclera White and quiet White and quiet   Cornea 1+ Punctate epithelial erosions, Well healed cataract wounds 1+ Punctate epithelial erosions, Well healed cataract wounds   Anterior Chamber Deep and quiet Deep and quiet   Iris Round and dilated, No NVI Round and dilated, No NVI   Lens PC IOL in good position, trace Posterior capsular opacification toric PC IOL in good position with marks at 0600 and 1200, trace Posterior capsular opacification   Vitreous Vitreous syneresis, Posterior vitreous detachment Vitreous syneresis, Posterior vitreous detachment, Weiss ring       Fundus Exam      Right Left   Disc Compact, temporal Peripapillary atrophy Tilted disc, temporal Peripapillary atrophy   C/D Ratio 0.1 0.1   Macula Flat, Blunted foveal reflex, trace Epiretinal membrane, No heme or edema, blonde fundus Blunted foveal reflex, Retinal pigment epithelial mottling, no edema, blonde fundus   Vessels Vascular attenuation Vascular attenuation   Periphery Attached, rare scattered MA, pigmented paving stone inferiorly Attached, 3 pigmented histo spots along 1030 meridian midzone, rare MA        Refraction    Manifest Refraction      Sphere Cylinder Axis Dist VA   Right -0.25 +0.75 060 20/20   Left -0.50 sph   20/25+2          IMAGING AND PROCEDURES  Imaging and Procedures for  @TODAY @  OCT, Retina - OU - Both Eyes       Right Eye Quality was good. Central Foveal Thickness: 289. Progression has no prior data. Findings include normal foveal contour, no SRF, no IRF, vitreomacular adhesion , myopic contour (Mild ERM).   Left Eye Quality was good. Progression has no prior data. Findings include normal foveal contour, no SRF, no IRF, myopic contour (Mild ERM, trace drusen).   Notes *Images captured and stored on drive  Diagnosis / Impression:  NFP, No IRF/SRF OU Trace ERM OU No DME OU   Clinical management:  See below  Abbreviations: NFP - Normal foveal profile. CME - cystoid macular edema. PED - pigment epithelial detachment. IRF - intraretinal fluid. SRF - subretinal fluid. EZ - ellipsoid zone. ERM - epiretinal membrane. ORA - outer retinal atrophy. ORT - outer retinal tubulation. SRHM - subretinal hyper-reflective material                 ASSESSMENT/PLAN:    ICD-10-CM   1. 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 Retinal edema H35.81 OCT, Retina - OU - Both Eyes  3. Essential hypertension I10   4. 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 Pseudophakia of both eyes Z96.1   7. History of myopia Z86.69     1,2. Mild nonproliferative diabetic retinopathy w/o DME, OU  - The incidence, risk factors for progression, natural history and treatment options for diabetic retinopathy were discussed with patient.    - The need for close monitoring of blood glucose, blood pressure, and serum lipids, avoiding  cigarette or any type of tobacco, and the need for long term follow up was also discussed with patient.  - exam shows rare, scattered peripheral MA's OU  - OCT without diabetic macular edema, OU  - f/u in 6 mos  3,4. Hypertensive retinopathy OU - discussed importance of tight BP control -  monitor  5. Histoplasmosis OS  - focal peripapillary pigmented CR  - 3 pigmented spots along 1030 meridian midzone  - no active inflammation  - long standing per pt  - monitor  6. Pseudophakia OU  - s/p CE/IOL OU  - beautiful surgeries, doing well  - done in [place], [place], 2015  - monitor  7. History of high myopia - corrected with cataract surgery - discussed association with RT/RD - monitor   Ophthalmic Meds Ordered this visit:  No orders of the defined types were placed in this encounter.      Return in about 6 months (around 11/11/2018) for f/u NPDR OU, DFE, OCT.  There are no Patient Instructions on file for this visit.   Explained the diagnoses, plan, and follow up with the patient and they expressed understanding.  Patient expressed understanding of the importance of proper follow up care.   This document serves as a record of services personally performed by Gardiner Sleeper, MD, PhD. It was created on their behalf by Ernest Mallick, OA, an ophthalmic assistant. The creation of this record is the provider's dictation and/or activities during the visit.    Electronically signed by: Ernest Mallick, OA  02.23.2020 4:37 PM    Gardiner Sleeper, M.D., Ph.D. Diseases & Surgery of the Retina and Vitreous [facility]  I have reviewed the above documentation for accuracy and completeness, and I agree with the above. Gardiner Sleeper, M.D., Ph.D. 05/13/18 4:37 PM   Abbreviations: M myopia (nearsighted); A astigmatism; H hyperopia (farsighted); P presbyopia; Mrx spectacle prescription;  CTL contact lenses; OD right eye; OS left eye; OU both eyes  XT exotropia; ET esotropia; PEK punctate epithelial keratitis; PEE punctate epithelial erosions; DES dry eye syndrome; MGD meibomian gland dysfunction; ATs artificial tears; PFAT's preservative free artificial tears; [place] nuclear sclerotic cataract; PSC posterior subcapsular cataract; ERM epi-retinal membrane; PVD  posterior vitreous detachment; RD retinal detachment; DM diabetes mellitus; DR diabetic retinopathy; NPDR non-proliferative diabetic retinopathy; PDR proliferative diabetic retinopathy; CSME clinically significant macular edema; DME diabetic macular edema; dbh dot blot hemorrhages; CWS cotton wool spot; POAG primary open angle glaucoma; C/D cup-to-disc ratio; HVF humphrey visual field; GVF goldmann visual field; OCT optical coherence tomography; IOP intraocular pressure; BRVO Branch retinal vein occlusion; CRVO central retinal vein occlusion; CRAO central retinal artery occlusion; BRAO branch retinal artery occlusion; RT retinal tear; SB scleral buckle; PPV pars plana vitrectomy; VH Vitreous hemorrhage; PRP panretinal laser photocoagulation; IVK intravitreal kenalog; VMT vitreomacular traction; MH Macular hole;  NVD neovascularization of the disc; NVE neovascularization elsewhere; AREDS age related eye disease study; ARMD age related macular degeneration; POAG primary open angle glaucoma; EBMD epithelial/anterior basement membrane dystrophy; ACIOL anterior chamber intraocular lens; IOL intraocular lens; PCIOL posterior chamber intraocular lens; Phaco/IOL phacoemulsification with intraocular lens placement; [place] photorefractive keratectomy; LASIK laser assisted in situ keratomileusis; HTN hypertension; DM diabetes mellitus; COPD chronic obstructive pulmonary disease

## 2018-05-13 ENCOUNTER — Encounter (INDEPENDENT_AMBULATORY_CARE_PROVIDER_SITE_OTHER): Payer: Self-pay | Admitting: Ophthalmology

## 2018-05-13 ENCOUNTER — Ambulatory Visit (INDEPENDENT_AMBULATORY_CARE_PROVIDER_SITE_OTHER): Payer: Medicare Other | Admitting: Ophthalmology

## 2018-05-13 DIAGNOSIS — E113293 Type 2 diabetes mellitus with mild nonproliferative diabetic retinopathy without macular edema, bilateral: Secondary | ICD-10-CM

## 2018-05-13 DIAGNOSIS — I10 Essential (primary) hypertension: Secondary | ICD-10-CM

## 2018-05-13 DIAGNOSIS — Z8669 Personal history of other diseases of the nervous system and sense organs: Secondary | ICD-10-CM

## 2018-05-13 DIAGNOSIS — H35033 Hypertensive retinopathy, bilateral: Secondary | ICD-10-CM | POA: Diagnosis not present

## 2018-05-13 DIAGNOSIS — H32 Chorioretinal disorders in diseases classified elsewhere: Secondary | ICD-10-CM

## 2018-05-13 DIAGNOSIS — B399 Histoplasmosis, unspecified: Secondary | ICD-10-CM | POA: Diagnosis not present

## 2018-05-13 DIAGNOSIS — Z961 Presence of intraocular lens: Secondary | ICD-10-CM

## 2018-05-13 DIAGNOSIS — H3581 Retinal edema: Secondary | ICD-10-CM

## 2018-05-17 ENCOUNTER — Encounter: Payer: Self-pay | Admitting: Family Medicine

## 2018-05-17 ENCOUNTER — Other Ambulatory Visit: Payer: Self-pay | Admitting: Family Medicine

## 2018-05-17 NOTE — Telephone Encounter (Signed)
Last med check 04/16/18 Last refill 02/21/18 #90/0 Next OV 07/16/18

## 2018-05-21 ENCOUNTER — Other Ambulatory Visit: Payer: Self-pay | Admitting: Family Medicine

## 2018-07-16 ENCOUNTER — Ambulatory Visit (INDEPENDENT_AMBULATORY_CARE_PROVIDER_SITE_OTHER): Payer: Medicare Other | Admitting: Family Medicine

## 2018-07-16 ENCOUNTER — Encounter: Payer: Self-pay | Admitting: Family Medicine

## 2018-07-16 ENCOUNTER — Other Ambulatory Visit: Payer: Self-pay

## 2018-07-16 DIAGNOSIS — E039 Hypothyroidism, unspecified: Secondary | ICD-10-CM

## 2018-07-16 DIAGNOSIS — E1159 Type 2 diabetes mellitus with other circulatory complications: Secondary | ICD-10-CM | POA: Diagnosis not present

## 2018-07-16 DIAGNOSIS — H35033 Hypertensive retinopathy, bilateral: Secondary | ICD-10-CM | POA: Insufficient documentation

## 2018-07-16 DIAGNOSIS — I152 Hypertension secondary to endocrine disorders: Secondary | ICD-10-CM

## 2018-07-16 DIAGNOSIS — Z7189 Other specified counseling: Secondary | ICD-10-CM

## 2018-07-16 DIAGNOSIS — R198 Other specified symptoms and signs involving the digestive system and abdomen: Secondary | ICD-10-CM

## 2018-07-16 DIAGNOSIS — I10 Essential (primary) hypertension: Secondary | ICD-10-CM

## 2018-07-16 DIAGNOSIS — J302 Other seasonal allergic rhinitis: Secondary | ICD-10-CM

## 2018-07-16 MED ORDER — DICYCLOMINE HCL 10 MG PO CAPS: 10.0000 mg | ORAL_CAPSULE | Freq: Three times a day (TID) | ORAL | 3 refills | Status: DC

## 2018-07-16 NOTE — Patient Instructions (Addendum)
GETTING TO GOOD BOWEL HEALTH  Irregular bowel habits such as constipation can lead to many problems over time.  Having one soft bowel movement a day is the most important way to prevent further problems.    The goal: ONE SOFT BOWEL MOVEMENT A DAY!  To have soft, regular bowel movements:  . Drink at least 8 tall glasses of water a day.   . Take plenty of fiber.  Fiber is the undigested part of plant food that passes into the colon, acting s "natures broom" to encourage bowel motility and movement.  Fiber can absorb and hold large amounts of water. This results in a larger, bulkier stool, which is soft and easier to pass. Work gradually over several weeks up to 6 servings a day of fiber (25g a day even more if needed) in the form of: o Vegetables -- Root (potatoes, carrots, turnips), leafy green (lettuce, salad greens, celery, spinach), or cooked high residue (cabbage, broccoli, etc) o Fruit -- Fresh (unpeeled skin & pulp), Dried (prunes, apricots, cherries, etc ),  or stewed ( applesauce)  o Whole grain breads, pasta, etc (whole wheat)  o Bran cereals  . Bulking Agents -- This type of water-retaining fiber generally is easily obtained each day by one of the following:  o Psyllium bran -- The psyllium plant is remarkable because its ground seeds can retain so much water. This product is available as Metamucil, Konsyl, Effersyllium, Per Diem Fiber, or the less expensive generic preparation in drug and health food stores. Although labeled a laxative, it really is not a laxative.  o Methylcellulose -- This is another fiber derived from wood which also retains water. It is available as Citrucel. o Polyethylene Glycol - and "artificial" fiber commonly called Miralax or Glycolax.  It is helpful for people with gassy or bloated feelings with regular fiber o Flax Seed - a less gassy fiber than psyllium . No reading or other relaxing activity while on the toilet. If bowel movements take longer than 5 minutes,  you are too constipated. . AVOID CONSTIPATION.  High fiber and water intake usually takes care of this.  Sometimes a laxative is needed to stimulate more frequent bowel movements, but  . Laxatives are not a good long-term solution as it can wear the colon out. o Osmotics (Milk of Magnesia, Fleets phosphosoda, Magnesium citrate, MiraLax, GoLytely) are safer than  o Stimulants (Senokot, Castor Oil, Dulcolax, Ex Lax)    o Do not take laxatives for more than 7days in a row. .  IF SEVERELY CONSTIPATED, try a Bowel Retraining Program: o Do not use laxatives.  o Eat a diet high in roughage, such as bran cereals and leafy vegetables.  o Drink six (6) ounces of prune or apricot juice each morning.  o Eat two (2) large servings of stewed fruit each day.  o Take one (1) heaping tablespoon of a psyllium-based bulking agent twice a day. Use sugar-free sweetener when possible to avoid excessive calories.  o Eat a normal breakfast.  o Set aside 15 minutes after breakfast to sit on the toilet, but do not strain to have a bowel movement.  o If you do not have a bowel movement by the third day, use an enema and repeat the above steps.   

## 2018-07-16 NOTE — Progress Notes (Signed)
Virtual Visit via Video   Due to the COVID-19 pandemic, this visit was completed with telemedicine (audio/video) technology to reduce patient and provider exposure as well as to preserve personal protective equipment.   I connected with Brenda Griffin by a video enabled telemedicine application and verified that I am speaking with the correct person using two identifiers. Location patient: Home Location provider: [facility], Office Persons participating in the virtual visit: RAYLA PEMBER, Briscoe Deutscher, DO Lonell Grandchild, CMA acting as scribe for Dr. Briscoe Deutscher.   I discussed the limitations of evaluation and management by telemedicine and the availability of in person appointments. The patient expressed understanding and agreed to proceed.  Care Team   Patient Care Team: Briscoe Deutscher, DO as PCP - General (Family Medicine) Salvadore Dom, MD as Consulting Physician (Obstetrics and Gynecology) Mammography, [facility] (Diagnostic Radiology)  Subjective:   HPI:   Alternating constipation and diarrhea  She has had times when she will have constipation with no bowel movement for 9 days or more at a time then will have loose stools for several days. She has had times where she has not bade it to bathroom. She will have pain in lower abdomen when it starts. She has had issues with it for over a month. She does not think that she had any changes in anything before it starts. She can not link it to anything that she has been eating. She is not going out to eat right now due to Covid so she is aware of everything that she is eating. Had a colorgard done recently.   Allergies Patient has had increased allergies symptoms for last day. She is only taking over the counter allergy medications. She denies any fever or cough only sneezing.    Diabetes Medication compliance: compliant all of the time, diabetic diet compliance: compliant most of the time, home glucose monitoring: is performed  sporadically, further diabetic ROS: no polyuria or polydipsia, no chest pain, dyspnea or TIA's, no numbness, tingling or pain in extremities.  Hypertension Review: taking medications as instructed, no medication side effects noted, no TIAs, no chest pain on exertion, no dyspnea on exertion, no swelling of ankles. Smoker: No.   Hyperilipidemia Is the patient taking medications without problems? Yes. Does the patient complain of muscle aches?  No. Trying to exercise on a regular basis? No. Compliant with diet? Yes.  Review of Systems  Constitutional: Negative for chills, fever, malaise/fatigue and weight loss.  Respiratory: Negative for cough, shortness of breath and wheezing.   Cardiovascular: Negative for chest pain, palpitations and leg swelling.  Gastrointestinal: Negative for abdominal pain, constipation, diarrhea, nausea and vomiting.  Genitourinary: Negative for dysuria and urgency.  Musculoskeletal: Negative for joint pain and myalgias.  Skin: Negative for rash.  Neurological: Negative for dizziness and headaches.  Psychiatric/Behavioral: Negative for depression, substance abuse and suicidal ideas. The patient is not nervous/anxious.     Patient Active Problem List   Diagnosis Date Noted  . Hypertensive retinopathy of both eyes 07/16/2018  . GERD (gastroesophageal reflux disease) 04/16/2018  . Diabetic polyneuropathy associated with type 2 diabetes mellitus ([place]) 04/15/2017  . Hormone replacement therapy 10/11/2016  . Cluster headaches 10/11/2016  . Type 2 diabetes mellitus with other specified complication ([place]) 40/12/2723  . Hypothyroidism 10/11/2016  . Hypertension associated with diabetes ([place]) 10/11/2016  . History of skin cancer 10/11/2016  . Hyperlipidemia associated with type 2 diabetes mellitus ([place]) 10/11/2016  . Anxiety 10/11/2016  . Morbid obesity (  [place]) 10/11/2016    Social History   Tobacco Use  . Smoking status: Former Smoker    Packs/day: 1.00    Years:  11.00    Pack years: 11.00    Types: Cigarettes    Last attempt to quit: 03/20/1981    Years since quitting: 37.3  . Smokeless tobacco: Never Used  Substance Use Topics  . Alcohol use: Yes    Alcohol/week: 1.0 - 2.0 standard drinks    Types: 1 - 2 Glasses of wine per week    Current Outpatient Medications:  .  acetaminophen (TYLENOL) 500 MG tablet, Take 500 mg by mouth as needed., Disp: , Rfl:  .  ALPRAZolam (XANAX) 0.5 MG tablet, Take 1 tablet (0.5 mg total) by mouth at bedtime as needed for anxiety., Disp: 30 tablet, Rfl: 2 .  Biotin 10000 MCG TABS, Take 1 tablet by mouth daily as needed., Disp: , Rfl:  .  diphenhydrAMINE (BENADRYL) 25 MG tablet, Take 25 mg by mouth as needed., Disp: , Rfl:  .  estradiol (ESTRACE) 1 MG tablet, Take 1 tablet (1 mg total) by mouth daily., Disp: 90 tablet, Rfl: 3 .  glimepiride (AMARYL) 2 MG tablet, Take 1 tablet (2 mg total) by mouth 2 (two) times daily., Disp: 180 tablet, Rfl: 3 .  levothyroxine (SYNTHROID, LEVOTHROID) 100 MCG tablet, TAKE 1 TABLET(100 MCG) BY MOUTH DAILY BEFORE BREAKFAST, Disp: 90 tablet, Rfl: 0 .  loperamide (IMODIUM A-D) 2 MG tablet, Take 2 mg by mouth as needed for diarrhea or loose stools., Disp: , Rfl:  .  losartan-hydrochlorothiazide (HYZAAR) 100-12.5 MG tablet, TAKE 1 TABLET BY MOUTH DAILY, Disp: 90 tablet, Rfl: 1 .  metFORMIN (GLUCOPHAGE-XR) 500 MG 24 hr tablet, TAKE 1 TABLET(500 MG) BY MOUTH THREE TIMES DAILY, Disp: 270 tablet, Rfl: 1 .  pantoprazole (PROTONIX) 40 MG tablet, TAKE 1 TABLET(40 MG) BY MOUTH DAILY, Disp: 30 tablet, Rfl: 5 .  rosuvastatin (CRESTOR) 20 MG tablet, Take 1 tablet (20 mg total) by mouth daily., Disp: 90 tablet, Rfl: 3 .  senna (SENOKOT) 8.6 MG tablet, Take 1 tablet by mouth as needed for constipation., Disp: , Rfl:  .  TRULICITY 1.5 LK/4.4WN SOPN, INEJCT 1.5 MG WEEKLY, Disp: 2 mL, Rfl: 4  Allergies  Allergen Reactions  . Bacitracin Rash  . Benzoic Acid Rash  . Benzyl Salicylate Rash  . Formaldehyde  Rash  . Geraniol Rash  . Hydroxyethyl Methacrylate Rash  . Methyl Methacrylate [Methylmethacrylate] Rash  . Other Rash    Amyl Cinnamaldehyde  . Parabens Hives  . Adhesive [Tape] Itching  . Aspirin Nausea And Vomiting  . Codeine Nausea And Vomiting  . Victoza [Liraglutide] Diarrhea  . Butanediol [Butylene Glycol] Rash   Objective:   VITALS: Per patient if applicable, see vitals. GENERAL: Alert, appears well and in no acute distress. HEENT: Atraumatic, conjunctiva clear, no obvious abnormalities on inspection of external nose and ears. NECK: Normal movements of the head and neck. CARDIOPULMONARY: No increased WOB. Speaking in clear sentences. I:E ratio WNL.  MS: Moves all visible extremities without noticeable abnormality. PSYCH: Pleasant and cooperative, well-groomed. Speech normal rate and rhythm. Affect is appropriate. Insight and judgement are appropriate. Attention is focused, linear, and appropriate.  NEURO: CN grossly intact. Oriented as arrived to appointment on time with no prompting. Moves both UE equally.  SKIN: No obvious lesions, wounds, erythema, or cyanosis noted on face or hands.  Depression screen [facility] 2/9 04/16/2018 10/09/2017 01/16/2017  Decreased Interest 0 0 0  Down,  Depressed, Hopeless 0 0 1  PHQ - 2 Score 0 0 1  Altered sleeping 2 3 -  Tired, decreased energy 1 0 -  Change in appetite 2 0 -  Feeling bad or failure about yourself  0 0 -  Trouble concentrating 0 0 -  Moving slowly or fidgety/restless 0 0 -  Suicidal thoughts 0 0 -  PHQ-9 Score 5 3 -  Difficult doing work/chores Not difficult at all Not difficult at all -   Assessment and Plan:   Antonio was seen today for follow-up.  Diagnoses and all orders for this visit:  Hypertension associated with diabetes ([place]) Comments: Stable.  Continue current treatment.  Acquired hypothyroidism Comments: Euthyroid.  Continue current treatment.  Hypertensive retinopathy of both eyes  Educated About  Covid-19 Virus Infection  Morbid obesity ([place]) Comments: Reviewed healthy food choices and exercise as tolerated.  Seasonal allergies Comments: Discussed stepwise treatment.  Alternating constipation and diarrhea Comments: Near.  Possible IBS.  Discussed differential including partial obstruction.  Bowel regimen and Bentyl trial.  Possible CT or GI referral. Orders: -     dicyclomine (BENTYL) 10 MG capsule; Take 1 capsule (10 mg total) by mouth 4 (four) times daily -  before meals and at bedtime.    Marland Kitchen COVID-19 Education: The signs and symptoms of COVID-19 were discussed with the patient and how to seek care for testing if needed. The importance of social distancing was discussed today. . Reviewed expectations re: course of current medical issues. . Discussed self-management of symptoms. . Outlined signs and symptoms indicating need for more acute intervention. . Patient verbalized understanding and all questions were answered. Marland Kitchen Health Maintenance issues including appropriate healthy diet, exercise, and smoking avoidance were discussed with patient. . See orders for this visit as documented in the electronic medical record.  Briscoe Deutscher, DO 07/20/2018  Records requested if needed. Time spent: 25 minutes, of which >50% was spent in obtaining information about her symptoms, reviewing her previous labs, evaluations, and treatments, counseling her about her condition (please see the discussed topics above), and developing a plan to further investigate it; she had a number of questions which I addressed.

## 2018-07-17 NOTE — Telephone Encounter (Signed)
Please see message and advise.  Thank you. ° °

## 2018-07-18 NOTE — Telephone Encounter (Signed)
Dr. Juleen China, pt wants to know about fiber? Please advise.

## 2018-07-20 ENCOUNTER — Encounter: Payer: Self-pay | Admitting: Family Medicine

## 2018-09-28 ENCOUNTER — Other Ambulatory Visit: Payer: Self-pay | Admitting: Family Medicine

## 2018-09-30 ENCOUNTER — Other Ambulatory Visit: Payer: Self-pay | Admitting: Family Medicine

## 2018-09-30 DIAGNOSIS — E785 Hyperlipidemia, unspecified: Secondary | ICD-10-CM

## 2018-09-30 DIAGNOSIS — E1169 Type 2 diabetes mellitus with other specified complication: Secondary | ICD-10-CM

## 2018-10-23 ENCOUNTER — Ambulatory Visit: Payer: Medicare Other | Admitting: Family Medicine

## 2018-10-23 NOTE — Progress Notes (Deleted)
Virtual Visit via Video   Due to the COVID-19 pandemic, this visit was completed with telemedicine (audio/video) technology to reduce patient and provider exposure as well as to preserve personal protective equipment.   I connected with Brenda Griffin by a video enabled telemedicine application and verified that I am speaking with the correct person using two identifiers. Location patient: Home Location provider: [facility], Office Persons participating in the virtual visit: [place], Brock[place], DO   I discussed the limitations of evaluation and management by telemedicine and the availability of in person appointments. The patient expressed understanding and agreed to proceed.  Care Team   Patient Care Team: Briscoe Deutscher, DO as PCP - General (Family Medicine) Salvadore Dom, MD as Consulting Physician (Obstetrics and Gynecology) Mammography, [facility] (Diagnostic Radiology)  Subjective:   HPI:   ROS   Patient Active Problem List   Diagnosis Date Noted  . Hypertensive retinopathy of both eyes 07/16/2018  . GERD (gastroesophageal reflux disease) 04/16/2018  . Diabetic polyneuropathy associated with type 2 diabetes mellitus ([place]) 04/15/2017  . Hormone replacement therapy 10/11/2016  . Cluster headaches 10/11/2016  . Type 2 diabetes mellitus with other specified complication ([place]) 44/81/8563  . Hypothyroidism 10/11/2016  . Hypertension associated with diabetes ([place]) 10/11/2016  . History of skin cancer 10/11/2016  . Hyperlipidemia associated with type 2 diabetes mellitus ([place]) 10/11/2016  . Anxiety 10/11/2016  . Morbid obesity ([place]) 10/11/2016    Social History   Tobacco Use  . Smoking status: Former Smoker    Packs/day: 1.00    Years: 11.00    Pack years: 11.00    Types: Cigarettes    Quit date: 03/20/1981    Years since quitting: 37.6  . Smokeless tobacco: Never Used  Substance Use Topics  . Alcohol use: Yes    Alcohol/week: 1.0 - 2.0 standard drinks      Types: 1 - 2 Glasses of wine per week    Current Outpatient Medications:  .  acetaminophen (TYLENOL) 500 MG tablet, Take 500 mg by mouth as needed., Disp: , Rfl:  .  ALPRAZolam (XANAX) 0.5 MG tablet, Take 1 tablet (0.5 mg total) by mouth at bedtime as needed for anxiety., Disp: 30 tablet, Rfl: 2 .  Biotin 10000 MCG TABS, Take 1 tablet by mouth daily as needed., Disp: , Rfl:  .  dicyclomine (BENTYL) 10 MG capsule, Take 1 capsule (10 mg total) by mouth 4 (four) times daily -  before meals and at bedtime., Disp: 60 capsule, Rfl: 3 .  estradiol (ESTRACE) 1 MG tablet, Take 1 tablet (1 mg total) by mouth daily., Disp: 90 tablet, Rfl: 3 .  glimepiride (AMARYL) 2 MG tablet, Take 1 tablet (2 mg total) by mouth 2 (two) times daily., Disp: 180 tablet, Rfl: 3 .  levothyroxine (SYNTHROID, LEVOTHROID) 100 MCG tablet, TAKE 1 TABLET(100 MCG) BY MOUTH DAILY BEFORE BREAKFAST, Disp: 90 tablet, Rfl: 0 .  loperamide (IMODIUM A-D) 2 MG tablet, Take 2 mg by mouth as needed for diarrhea or loose stools., Disp: , Rfl:  .  losartan-hydrochlorothiazide (HYZAAR) 100-12.5 MG tablet, TAKE 1 TABLET BY MOUTH DAILY, Disp: 90 tablet, Rfl: 1 .  metFORMIN (GLUCOPHAGE-XR) 500 MG 24 hr tablet, TAKE 1 TABLET(500 MG) BY MOUTH THREE TIMES DAILY, Disp: 270 tablet, Rfl: 0 .  pantoprazole (PROTONIX) 40 MG tablet, TAKE 1 TABLET(40 MG) BY MOUTH DAILY, Disp: 30 tablet, Rfl: 5 .  rosuvastatin (CRESTOR) 20 MG tablet, TAKE 1 TABLET(20 MG) BY MOUTH DAILY,  Disp: 90 tablet, Rfl: 0 .  senna (SENOKOT) 8.6 MG tablet, Take 1 tablet by mouth as needed for constipation., Disp: , Rfl:  .  TRULICITY 1.5 EY/8.1KG SOPN, INEJCT 1.5 MG WEEKLY, Disp: 2 mL, Rfl: 4  Allergies  Allergen Reactions  . Bacitracin Rash  . Benzoic Acid Rash  . Benzyl Salicylate Rash  . Formaldehyde Rash  . Geraniol Rash  . Hydroxyethyl Methacrylate Rash  . Methyl Methacrylate [Methylmethacrylate] Rash  . Other Rash    Amyl Cinnamaldehyde  . Parabens Hives  . Adhesive  [Tape] Itching  . Aspirin Nausea And Vomiting  . Codeine Nausea And Vomiting  . Victoza [Liraglutide] Diarrhea  . Butanediol [Butylene Glycol] Rash    Objective:   VITALS: Per patient if applicable, see vitals. GENERAL: Alert, appears well and in no acute distress. HEENT: Atraumatic, conjunctiva clear, no obvious abnormalities on inspection of external nose and ears. NECK: Normal movements of the head and neck. CARDIOPULMONARY: No increased WOB. Speaking in clear sentences. I:E ratio WNL.  MS: Moves all visible extremities without noticeable abnormality. PSYCH: Pleasant and cooperative, well-groomed. Speech normal rate and rhythm. Affect is appropriate. Insight and judgement are appropriate. Attention is focused, linear, and appropriate.  NEURO: CN grossly intact. Oriented as arrived to appointment on time with no prompting. Moves both UE equally.  SKIN: No obvious lesions, wounds, erythema, or cyanosis noted on face or hands.  Depression screen [facility] 2/9 04/16/2018 10/09/2017 01/16/2017  Decreased Interest 0 0 0  Down, Depressed, Hopeless 0 0 1  PHQ - 2 Score 0 0 1  Altered sleeping 2 3 -  Tired, decreased energy 1 0 -  Change in appetite 2 0 -  Feeling bad or failure about yourself  0 0 -  Trouble concentrating 0 0 -  Moving slowly or fidgety/restless 0 0 -  Suicidal thoughts 0 0 -  PHQ-9 Score 5 3 -  Difficult doing work/chores Not difficult at all Not difficult at all -    Assessment and Plan:   There are no diagnoses linked to this encounter.  Marland Kitchen COVID-19 Education: The signs and symptoms of COVID-19 were discussed with the patient and how to seek care for testing if needed. The importance of social distancing was discussed today. . Reviewed expectations re: course of current medical issues. . Discussed self-management of symptoms. . Outlined signs and symptoms indicating need for more acute intervention. . Patient verbalized understanding and all questions were  answered. Marland Kitchen Health Maintenance issues including appropriate healthy diet, exercise, and smoking avoidance were discussed with patient. . See orders for this visit as documented in the electronic medical record.  Briscoe Deutscher, DO  Records requested if needed. Time spent: *** minutes, of which >50% was spent in obtaining information about her symptoms, reviewing her previous labs, evaluations, and treatments, counseling her about her condition (please see the discussed topics above), and developing a plan to further investigate it; she had a number of questions which I addressed.

## 2018-11-01 ENCOUNTER — Other Ambulatory Visit: Payer: Self-pay

## 2018-11-01 ENCOUNTER — Ambulatory Visit (INDEPENDENT_AMBULATORY_CARE_PROVIDER_SITE_OTHER): Payer: Medicare Other | Admitting: Family Medicine

## 2018-11-01 ENCOUNTER — Encounter: Payer: Self-pay | Admitting: Family Medicine

## 2018-11-01 ENCOUNTER — Encounter: Payer: Self-pay | Admitting: Gastroenterology

## 2018-11-01 VITALS — BP 107/65 | HR 90 | Ht 66.0 in | Wt 220.0 lb

## 2018-11-01 DIAGNOSIS — K5909 Other constipation: Secondary | ICD-10-CM

## 2018-11-01 DIAGNOSIS — F419 Anxiety disorder, unspecified: Secondary | ICD-10-CM | POA: Diagnosis not present

## 2018-11-01 DIAGNOSIS — Z85828 Personal history of other malignant neoplasm of skin: Secondary | ICD-10-CM

## 2018-11-01 DIAGNOSIS — E039 Hypothyroidism, unspecified: Secondary | ICD-10-CM

## 2018-11-01 DIAGNOSIS — E1159 Type 2 diabetes mellitus with other circulatory complications: Secondary | ICD-10-CM

## 2018-11-01 DIAGNOSIS — G44009 Cluster headache syndrome, unspecified, not intractable: Secondary | ICD-10-CM

## 2018-11-01 DIAGNOSIS — I10 Essential (primary) hypertension: Secondary | ICD-10-CM

## 2018-11-01 DIAGNOSIS — E785 Hyperlipidemia, unspecified: Secondary | ICD-10-CM

## 2018-11-01 DIAGNOSIS — E1169 Type 2 diabetes mellitus with other specified complication: Secondary | ICD-10-CM | POA: Diagnosis not present

## 2018-11-01 DIAGNOSIS — Z79899 Other long term (current) drug therapy: Secondary | ICD-10-CM

## 2018-11-01 DIAGNOSIS — I152 Hypertension secondary to endocrine disorders: Secondary | ICD-10-CM

## 2018-11-01 DIAGNOSIS — K219 Gastro-esophageal reflux disease without esophagitis: Secondary | ICD-10-CM

## 2018-11-01 DIAGNOSIS — Z79890 Hormone replacement therapy (postmenopausal): Secondary | ICD-10-CM

## 2018-11-01 MED ORDER — ALPRAZOLAM 0.5 MG PO TABS: 0.5000 mg | ORAL_TABLET | Freq: Every evening | ORAL | 2 refills | Status: DC | PRN

## 2018-11-01 MED ORDER — LOSARTAN POTASSIUM-HCTZ 100-12.5 MG PO TABS: 1.0000 | ORAL_TABLET | Freq: Every day | ORAL | 1 refills | Status: DC

## 2018-11-01 MED ORDER — PANTOPRAZOLE SODIUM 40 MG PO TBEC: DELAYED_RELEASE_TABLET | ORAL | 3 refills | Status: DC

## 2018-11-01 MED ORDER — LUBIPROSTONE 8 MCG PO CAPS: 8.0000 ug | ORAL_CAPSULE | Freq: Two times a day (BID) | ORAL | 3 refills | Status: DC

## 2018-11-01 NOTE — Patient Instructions (Signed)
GETTING TO GOOD BOWEL HEALTH  Irregular bowel habits such as constipation can lead to many problems over time.  Having one soft bowel movement a day is the most important way to prevent further problems.    The goal: ONE SOFT BOWEL MOVEMENT A DAY!  To have soft, regular bowel movements:  . Drink at least 8 tall glasses of water a day.   . Take plenty of fiber.  Fiber is the undigested part of plant food that passes into the colon, acting s "natures broom" to encourage bowel motility and movement.  Fiber can absorb and hold large amounts of water. This results in a larger, bulkier stool, which is soft and easier to pass. Work gradually over several weeks up to 6 servings a day of fiber (25g a day even more if needed) in the form of: o Vegetables -- Root (potatoes, carrots, turnips), leafy green (lettuce, salad greens, celery, spinach), or cooked high residue (cabbage, broccoli, etc) o Fruit -- Fresh (unpeeled skin & pulp), Dried (prunes, apricots, cherries, etc ),  or stewed ( applesauce)  o Whole grain breads, pasta, etc (whole wheat)  o Bran cereals  . Bulking Agents -- This type of water-retaining fiber generally is easily obtained each day by one of the following:  o Psyllium bran -- The psyllium plant is remarkable because its ground seeds can retain so much water. This product is available as Metamucil, Konsyl, Effersyllium, Per Diem Fiber, or the less expensive generic preparation in drug and health food stores. Although labeled a laxative, it really is not a laxative.  o Methylcellulose -- This is another fiber derived from wood which also retains water. It is available as Citrucel. o Polyethylene Glycol - and "artificial" fiber commonly called Miralax or Glycolax.  It is helpful for people with gassy or bloated feelings with regular fiber o Flax Seed - a less gassy fiber than psyllium . No reading or other relaxing activity while on the toilet. If bowel movements take longer than 5 minutes,  you are too constipated. . AVOID CONSTIPATION.  High fiber and water intake usually takes care of this.  Sometimes a laxative is needed to stimulate more frequent bowel movements, but  . Laxatives are not a good long-term solution as it can wear the colon out. o Osmotics (Milk of Magnesia, Fleets phosphosoda, Magnesium citrate, MiraLax, GoLytely) are safer than  o Stimulants (Senokot, Castor Oil, Dulcolax, Ex Lax)    o Do not take laxatives for more than 7days in a row. .  IF SEVERELY CONSTIPATED, try a Bowel Retraining Program: o Do not use laxatives.  o Eat a diet high in roughage, such as bran cereals and leafy vegetables.  o Drink six (6) ounces of prune or apricot juice each morning.  o Eat two (2) large servings of stewed fruit each day.  o Take one (1) heaping tablespoon of a psyllium-based bulking agent twice a day. Use sugar-free sweetener when possible to avoid excessive calories.  o Eat a normal breakfast.  o Set aside 15 minutes after breakfast to sit on the toilet, but do not strain to have a bowel movement.  o If you do not have a bowel movement by the third day, use an enema and repeat the above steps.   

## 2018-11-01 NOTE — Addendum Note (Signed)
Addended by: Francis Dowse T on: 11/01/2018 12:05 PM   Modules accepted: Orders

## 2018-11-01 NOTE — Progress Notes (Signed)
Virtual Visit via Video   Due to the COVID-19 pandemic, this visit was completed with telemedicine (audio/video) technology to reduce patient and provider exposure as well as to preserve personal protective equipment.   I connected with Brenda Griffin by a video enabled telemedicine application and verified that I am speaking with the correct person using two identifiers. Location patient: Home Location provider: [facility], Office Persons participating in the virtual visit: Brenda Griffin, Brenda Deutscher, Brenda Griffin Brenda Griffin, Brenda Griffin acting as scribe for Dr. Briscoe Griffin.   I discussed the limitations of evaluation and management by telemedicine and the availability of in person appointments. The patient expressed understanding and agreed to proceed.  Care Team   Patient Care Team: Brenda Deutscher, Brenda Griffin as PCP - General (Family Medicine) Salvadore Dom, MD as Consulting Physician (Obstetrics and Gynecology) Mammography, [facility] (Diagnostic Radiology)  Subjective:   HPI: Patient following up on diarrhea and constipation. At last visit she was started on Bentyl helped for fist few weeks but is no longer working. Symptoms have returned to starting point with no treatment.    Patient actually has long periods of constipation, with no bowel movement up to 10 days, then followed by diarrhea.  After day of diarrhea, the patient takes Imodium and the constipation restarts.  Patient is also on Trulicity and metformin.  She cannot remember the last time she saw a gastroenterologist but remembers that it was in [place] before moving here.  She drinks plenty of fluids and tries to get fiber in her diet.  Not much exercise.  No vomiting.  She does have issues with belching.  She is taking and tolerating all of her medications at this time.  No side effects.  No issues with blood pressure or blood sugars. Review of Systems  Constitutional: Negative for chills and fever.  HENT: Negative for hearing  loss and tinnitus.   Eyes: Negative for blurred vision and double vision.  Respiratory: Negative for cough.   Cardiovascular: Negative for chest pain, palpitations and leg swelling.  Gastrointestinal: Positive for constipation and diarrhea. Negative for nausea and vomiting.  Genitourinary: Negative for dysuria and urgency.  Neurological: Negative for dizziness and headaches.  Psychiatric/Behavioral: Negative for depression and suicidal ideas.    Patient Active Problem List   Diagnosis Date Noted  . Hypertensive retinopathy of both eyes 07/16/2018  . GERD (gastroesophageal reflux disease) 04/16/2018  . Diabetic polyneuropathy associated with type 2 diabetes mellitus ([place]) 04/15/2017  . Hormone replacement therapy, followed by GYN 10/11/2016  . Cluster headaches, rarely, prn butalbital 10/11/2016  . Type 2 diabetes mellitus with other specified complication ([place]), Rx Trulicity, Metformin, Amaryl; cost issues 10/11/2016  . Hypothyroidism, on Levothyroxine 10/11/2016  . Hypertension associated with diabetes ([place]), Rx Hyzaar 10/11/2016  . Hyperlipidemia associated with type 2 diabetes mellitus ([place]), Rx Crestor 10/11/2016  . Anxiety, Rx Xanax, uses sparingly 10/11/2016  . Morbid obesity ([place]) 10/11/2016    Social History   Tobacco Use  . Smoking status: Former Smoker    Packs/day: 1.00    Years: 11.00    Pack years: 11.00    Types: Cigarettes    Quit date: 03/20/1981    Years since quitting: 37.6  . Smokeless tobacco: Never Used  Substance Use Topics  . Alcohol use: Yes    Alcohol/week: 1.0 - 2.0 standard drinks    Types: 1 - 2 Glasses of wine per week   Current Outpatient Medications:  .  acetaminophen (TYLENOL) 500 MG tablet,  Take 500 mg by mouth as needed., Disp: , Rfl:  .  ALPRAZolam (XANAX) 0.5 MG tablet, Take 1 tablet (0.5 mg total) by mouth at bedtime as needed for anxiety., Disp: 30 tablet, Rfl: 2 .  Biotin 10000 MCG TABS, Take 1 tablet by mouth daily as needed., Disp: ,  Rfl:  .  dicyclomine (BENTYL) 10 MG capsule, Take 1 capsule (10 mg total) by mouth 4 (four) times daily -  before meals and at bedtime., Disp: 60 capsule, Rfl: 3 .  estradiol (ESTRACE) 1 MG tablet, Take 1 tablet (1 mg total) by mouth daily., Disp: 90 tablet, Rfl: 3 .  glimepiride (AMARYL) 2 MG tablet, Take 1 tablet (2 mg total) by mouth 2 (two) times daily., Disp: 180 tablet, Rfl: 3 .  levothyroxine (SYNTHROID, LEVOTHROID) 100 MCG tablet, TAKE 1 TABLET(100 MCG) BY MOUTH DAILY BEFORE BREAKFAST, Disp: 90 tablet, Rfl: 0 .  loperamide (IMODIUM A-D) 2 MG tablet, Take 2 mg by mouth as needed for diarrhea or loose stools., Disp: , Rfl:  .  losartan-hydrochlorothiazide (HYZAAR) 100-12.5 MG tablet, TAKE 1 TABLET BY MOUTH DAILY, Disp: 90 tablet, Rfl: 1 .  metFORMIN (GLUCOPHAGE-XR) 500 MG 24 hr tablet, TAKE 1 TABLET(500 MG) BY MOUTH THREE TIMES DAILY, Disp: 270 tablet, Rfl: 0 .  pantoprazole (PROTONIX) 40 MG tablet, TAKE 1 TABLET(40 MG) BY MOUTH DAILY, Disp: 30 tablet, Rfl: 5 .  rosuvastatin (CRESTOR) 20 MG tablet, TAKE 1 TABLET(20 MG) BY MOUTH DAILY, Disp: 90 tablet, Rfl: 0 .  senna (SENOKOT) 8.6 MG tablet, Take 1 tablet by mouth as needed for constipation., Disp: , Rfl:  .  TRULICITY 1.5 VW/0.9WJ SOPN, INEJCT 1.5 MG WEEKLY, Disp: 2 mL, Rfl: 4  Allergies  Allergen Reactions  . Bacitracin Rash  . Benzoic Acid Rash  . Benzyl Salicylate Rash  . Formaldehyde Rash  . Geraniol Rash  . Hydroxyethyl Methacrylate Rash  . Methyl Methacrylate [Methylmethacrylate] Rash  . Other Rash    Amyl Cinnamaldehyde  . Parabens Hives  . Adhesive [Tape] Itching  . Aspirin Nausea And Vomiting  . Codeine Nausea And Vomiting  . Victoza [Liraglutide] Diarrhea  . Butanediol [Butylene Glycol] Rash   Objective:   VITALS: Per patient if applicable, see vitals. GENERAL: Alert, appears well and in no acute distress. HEENT: Atraumatic, conjunctiva clear, no obvious abnormalities on inspection of external nose and ears. NECK:  Normal movements of the head and neck. CARDIOPULMONARY: No increased WOB. Speaking in clear sentences. I:E ratio WNL.  MS: Moves all visible extremities without noticeable abnormality. PSYCH: Pleasant and cooperative, well-groomed. Speech normal rate and rhythm. Affect is appropriate. Insight and judgement are appropriate. Attention is focused, linear, and appropriate.  NEURO: CN grossly intact. Oriented as arrived to appointment on time with no prompting. Moves both UE equally.  SKIN: No obvious lesions, wounds, erythema, or cyanosis noted on face or hands.  Depression screen [facility] 2/9 04/16/2018 10/09/2017 01/16/2017  Decreased Interest 0 0 0  Down, Depressed, Hopeless 0 0 1  PHQ - 2 Score 0 0 1  Altered sleeping 2 3 -  Tired, decreased energy 1 0 -  Change in appetite 2 0 -  Feeling bad or failure about yourself  0 0 -  Trouble concentrating 0 0 -  Moving slowly or fidgety/restless 0 0 -  Suicidal thoughts 0 0 -  PHQ-9 Score 5 3 -  Difficult doing work/chores Not difficult at all Not difficult at all -   Assessment and Plan:   Bertrice was  seen today for follow-up.  Diagnoses and all orders for this visit:  Type 2 diabetes mellitus with other specified complication, without long-term current use of insulin ([place]) Comments: A1c 8 at last visit.  She does take Trulicity, metformin, glimepiride.  Recheck A1c today.  Eye exam was 05/13/2018. Orders: -     CBC with Differential/Platelet -     Comprehensive metabolic panel -     Hemoglobin A1c  Anxiety Comments: Refill today. Orders: -     ALPRAZolam (XANAX) 0.5 MG tablet; Take 1 tablet (0.5 mg total) by mouth at bedtime as needed for anxiety.  Hyperlipidemia associated with type 2 diabetes mellitus ([place]) Comments: Currently controlled with Crestor 20 mg p.o. nightly.  Recheck lipid panel.  ASCVD is over 20%. Orders: -     Lipid panel  Hypertension associated with diabetes ([place]) -     losartan-hydrochlorothiazide (HYZAAR)  100-12.5 MG tablet; Take 1 tablet by mouth daily.  Acquired hypothyroidism Comments: Feels euthyroid.  Due for recheck labs. Orders: -     TSH -     T4, free  Morbid obesity ([place]) Comments: We reviewed the importance of healthy food choices and regular exercise.  Chronic constipation -     lubiprostone (AMITIZA) 8 MCG capsule; Take 1 capsule (8 mcg total) by mouth 2 (two) times daily with a meal. -     Ambulatory referral to Gastroenterology  Medication management Comments: Patient on metformin so we will check B12 level. Orders: -     Vitamin B12  Gastroesophageal reflux disease without esophagitis Comments: Currently on Protonix 40 mg p.o. daily.  Still issues with dyspepsia and belching.  Will be seeing gastroenterology. Orders: -     pantoprazole (PROTONIX) 40 MG tablet; TAKE 1 TABLET(40 MG) BY MOUTH DAILY  History of skin cancer  Cluster headache, not intractable, unspecified chronicity pattern  Hormone replacement therapy    . COVID-19 Education: The signs and symptoms of COVID-19 were discussed with the patient and how to seek care for testing if needed. The importance of social distancing was discussed today. . Reviewed expectations re: course of current medical issues. . Discussed self-management of symptoms. . Outlined signs and symptoms indicating need for more acute intervention. . Patient verbalized understanding and all questions were answered. Marland Kitchen Health Maintenance issues including appropriate healthy diet, exercise, and smoking avoidance were discussed with patient. . See orders for this visit as documented in the electronic medical record.  Brenda Deutscher, Brenda Griffin  Records requested if needed. Time spent: 25 minutes, of which >50% was spent in obtaining information about her symptoms, reviewing her previous labs, evaluations, and treatments, counseling her about her condition (please see the discussed topics above), and developing a plan to further investigate  it; she had a number of questions which I addressed.

## 2018-11-11 ENCOUNTER — Encounter (INDEPENDENT_AMBULATORY_CARE_PROVIDER_SITE_OTHER): Payer: Medicare Other | Admitting: Ophthalmology

## 2018-11-15 ENCOUNTER — Encounter: Payer: Self-pay | Admitting: Family Medicine

## 2018-11-18 ENCOUNTER — Encounter: Payer: Self-pay | Admitting: Family Medicine

## 2018-11-18 ENCOUNTER — Telehealth: Payer: Self-pay | Admitting: Family Medicine

## 2018-11-18 NOTE — Telephone Encounter (Signed)
Se note, may be attached to 11/15/2018 patient message. Unable to attach.   Copied from [place] [id number]. Topic: General - Other >> Nov 18, 2018  4:36 PM Yvette Rack wrote: Reason for CRM: Pt returned call to the office. Pt requests to be contacted at [phone]

## 2018-11-18 NOTE — Telephone Encounter (Signed)
I spoke with pt and she has an appointment for GI.  Added medication to allergy list, and relayed message from Dr. Juleen China to her.

## 2018-11-18 NOTE — Telephone Encounter (Signed)
I spoke with patient about the reaction but the phone got disconnected.  I will try and call pt back to continue with message.

## 2018-11-18 NOTE — Telephone Encounter (Signed)
Please see mychart encounter

## 2018-11-20 ENCOUNTER — Other Ambulatory Visit: Payer: Self-pay | Admitting: Family Medicine

## 2018-11-20 DIAGNOSIS — R198 Other specified symptoms and signs involving the digestive system and abdomen: Secondary | ICD-10-CM

## 2018-11-20 NOTE — Telephone Encounter (Signed)
Last fill 07/16/18  #60/3 Last OV 11/01/18

## 2018-11-22 ENCOUNTER — Other Ambulatory Visit: Payer: Self-pay

## 2018-11-22 ENCOUNTER — Other Ambulatory Visit (INDEPENDENT_AMBULATORY_CARE_PROVIDER_SITE_OTHER): Payer: Medicare Other

## 2018-11-22 DIAGNOSIS — E1169 Type 2 diabetes mellitus with other specified complication: Secondary | ICD-10-CM

## 2018-11-22 DIAGNOSIS — E785 Hyperlipidemia, unspecified: Secondary | ICD-10-CM | POA: Diagnosis not present

## 2018-11-22 DIAGNOSIS — E039 Hypothyroidism, unspecified: Secondary | ICD-10-CM | POA: Diagnosis not present

## 2018-11-22 DIAGNOSIS — Z79899 Other long term (current) drug therapy: Secondary | ICD-10-CM

## 2018-11-22 LAB — LDL CHOLESTEROL, DIRECT: Direct LDL: 55 mg/dL

## 2018-11-22 LAB — COMPREHENSIVE METABOLIC PANEL
ALT: 23 U/L (ref 0–35)
AST: 24 U/L (ref 0–37)
Albumin: 4 g/dL (ref 3.5–5.2)
Alkaline Phosphatase: 67 U/L (ref 39–117)
BUN: 16 mg/dL (ref 6–23)
CO2: 26 mEq/L (ref 19–32)
Calcium: 9.4 mg/dL (ref 8.4–10.5)
Chloride: 97 mEq/L (ref 96–112)
Creatinine, Ser: 1.01 mg/dL (ref 0.40–1.20)
GFR: 54.04 mL/min — ABNORMAL LOW (ref >60.00)
Glucose, Bld: 218 mg/dL — ABNORMAL HIGH (ref 70–99)
Potassium: 3.7 mEq/L (ref 3.5–5.1)
Sodium: 136 mEq/L (ref 135–145)
Total Bilirubin: 0.5 mg/dL (ref 0.2–1.2)
Total Protein: 6.4 g/dL (ref 6.0–8.3)

## 2018-11-22 LAB — CBC WITH DIFFERENTIAL/PLATELET
Basophils Absolute: 0.1 10*3/uL (ref 0.0–0.1)
Basophils Relative: 1.5 % (ref 0.0–3.0)
Eosinophils Absolute: 0.4 10*3/uL (ref 0.0–0.7)
Eosinophils Relative: 4.8 % (ref 0.0–5.0)
HCT: 41.4 % (ref 36.0–46.0)
Hemoglobin: 14.2 g/dL (ref 12.0–15.0)
Lymphocytes Relative: 26.2 % (ref 12.0–46.0)
Lymphs Abs: 2.2 10*3/uL (ref 0.7–4.0)
MCHC: 34.3 g/dL (ref 30.0–36.0)
MCV: 86.2 fl (ref 78.0–100.0)
Monocytes Absolute: 0.4 10*3/uL (ref 0.1–1.0)
Monocytes Relative: 4.6 % (ref 3.0–12.0)
Neutro Abs: 5.2 10*3/uL (ref 1.4–7.7)
Neutrophils Relative %: 62.9 % (ref 43.0–77.0)
Platelets: 309 10*3/uL (ref 150.0–400.0)
RBC: 4.8 Mil/uL (ref 3.87–5.11)
RDW: 14.3 % (ref 11.5–15.5)
WBC: 8.2 10*3/uL (ref 4.0–10.5)

## 2018-11-22 LAB — T4, FREE: Free T4: 1.73 ng/dL — ABNORMAL HIGH (ref 0.60–1.60)

## 2018-11-22 LAB — LIPID PANEL
Cholesterol: 126 mg/dL (ref 0–200)
HDL: 39.4 mg/dL (ref >39.00)
NonHDL: 86.82
Total CHOL/HDL Ratio: 3
Triglycerides: 307 mg/dL — ABNORMAL HIGH (ref 0.0–149.0)
VLDL: 61.4 mg/dL — ABNORMAL HIGH (ref 0.0–40.0)

## 2018-11-22 LAB — VITAMIN B12: Vitamin B-12: 217 pg/mL (ref 211–911)

## 2018-11-22 LAB — HEMOGLOBIN A1C: Hgb A1c MFr Bld: 9.2 % — ABNORMAL HIGH (ref 4.6–6.5)

## 2018-11-22 LAB — TSH: TSH: 3.76 u[IU]/mL (ref 0.35–4.50)

## 2018-12-06 ENCOUNTER — Ambulatory Visit (INDEPENDENT_AMBULATORY_CARE_PROVIDER_SITE_OTHER): Payer: Medicare Other | Admitting: Gastroenterology

## 2018-12-06 ENCOUNTER — Encounter: Payer: Self-pay | Admitting: Gastroenterology

## 2018-12-06 ENCOUNTER — Other Ambulatory Visit: Payer: Self-pay

## 2018-12-06 VITALS — BP 140/86 | HR 92 | Temp 98.2°F | Ht 66.0 in | Wt 220.4 lb

## 2018-12-06 DIAGNOSIS — K5909 Other constipation: Secondary | ICD-10-CM

## 2018-12-06 DIAGNOSIS — R142 Eructation: Secondary | ICD-10-CM | POA: Diagnosis not present

## 2018-12-06 DIAGNOSIS — R1010 Upper abdominal pain, unspecified: Secondary | ICD-10-CM

## 2018-12-06 DIAGNOSIS — E1169 Type 2 diabetes mellitus with other specified complication: Secondary | ICD-10-CM

## 2018-12-06 MED ORDER — METOCLOPRAMIDE HCL 5 MG PO TABS: 5.0000 mg | ORAL_TABLET | Freq: Four times a day (QID) | ORAL | 0 refills | Status: DC

## 2018-12-06 MED ORDER — NA SULFATE-K SULFATE-MG SULF 17.5-3.13-1.6 GM/177ML PO SOLN: 1.0000 | Freq: Once | ORAL | 0 refills | Status: AC

## 2018-12-06 NOTE — Patient Instructions (Signed)
If you are age 71 or older, your body mass index should be between 23-30. Your Body mass index is 35.57 kg/m. If this is out of the aforementioned range listed, please consider follow up with your Primary Care Provider.  If you are age 89 or younger, your body mass index should be between 19-25. Your Body mass index is 35.57 kg/m. If this is out of the aformentioned range listed, please consider follow up with your Primary Care Provider.   You have been scheduled for an endoscopy and colonoscopy. Please follow the written instructions given to you at your visit today. Please pick up your prep supplies at the pharmacy within the next 1-3 days. If you use inhalers (even only as needed), please bring them with you on the day of your procedure. Your physician has requested that you go to www.startemmi.com and enter the access code given to you at your visit today. This web site gives a general overview about your procedure. However, you should still follow specific instructions given to you by our office regarding your preparation for the procedure.  We have sent the following medications to your pharmacy for you to pick up at your convenience: Reglan 5 mg, take one tablet, one hour before drinking bowel prep. It was a pleasure to see you today!  Dr. Loletha Carrow

## 2018-12-06 NOTE — Progress Notes (Signed)
[facility] Consult Note:  History: Brenda Griffin 12/06/2018  Referring provider: Briscoe Deutscher, DO  Reason for consult/chief complaint: Constipation (alternating with diarrhea), Diarrhea, and Abdominal Pain Hervey Ard epigastric/upper abd pain)   Subjective  HPI: Seen by Dr. Yolanda Bonine of primary care via telemedicine on 11/01/2018.  Patient continued to have problems with chronic constipation where she might go up to 10 days without a BM, then had multiple episodes of loose stool which she would take Imodium and then developed constipation again.  Bentyl reportedly was somewhat helpful at first but lost its effect.  Patient was then given a trial of Amitiza 8 mg twice daily.  She had to stop after a few doses because she developed hives.(Multiple medicine allergies and intolerances noted) She had reportedly undergone GI evaluation in [place] prior to moving here, and tells me her last colonoscopy was about 12 years ago.  For least several years she has had chronic constipation.  After several days without a BM she would take Senokot daily.  After 2 or 3 days she would then start having bowel movements and would eventually progress to diarrhea.   She denies rectal bleeding.  Tajanay also has an upper abdominal pain that is there much of the time, often dull and sometimes sharp.  She has frequent belching without clear trigger.  She knows that she is lactose intolerant, and sometimes has milk or ice cream if she needs to have a bowel movement. She also admits to increased carb intake in last several months as "a coping mechanism" for COVID related stress and knows that is why her diabetes has lately been under suboptimal control. She denies dysphagia or odynophagia vomiting or weight loss.  Her chart notes a history of Crohn's disease in her brother, but she has me he does not have that, and she does not know for sure of his diagnosis.  ROS:  Review of Systems   Constitutional: Negative for appetite change and unexpected weight change.  HENT: Negative for mouth sores and voice change.   Eyes: Negative for pain and redness.  Respiratory: Negative for cough and shortness of breath.   Cardiovascular: Negative for chest pain and palpitations.  Genitourinary: Negative for dysuria and hematuria.  Musculoskeletal: Positive for back pain. Negative for arthralgias and myalgias.  Skin: Negative for pallor and rash.  Neurological: Negative for weakness and headaches.  Hematological: Negative for adenopathy.  Psychiatric/Behavioral:       Anxiety     Past Medical History: Past Medical History:  Diagnosis Date  . Anxiety   . Basal cell carcinoma   . Cataract   . Diabetes mellitus without complication ([place])   . Fibroid   . Gallstones   . GERD (gastroesophageal reflux disease)   . Heart murmur   . Hypercholesterolemia   . Hypertension   . Infertility, female   . Pneumonia   . Skin cancer   . Thyroid disease      Past Surgical History: Past Surgical History:  Procedure Laterality Date  . ABDOMINAL HYSTERECTOMY  1997   oophorectomy  . BUNIONECTOMY Bilateral 1997, 2001   right 1997, left 2001  . CATARACT EXTRACTION Bilateral 2006  . CERVICAL CONE BIOPSY  1976  . EYE SURGERY    . GALLBLADDER SURGERY    . LUMBAR DISC SURGERY    . MOHS SURGERY  2005/2007  . PILONIDAL CYST EXCISION  1975  . TONSILLECTOMY  1957     Family History: Family History  Problem Relation  Age of Onset  . Stroke Mother   . Heart disease Father   . Hyperlipidemia Father   . Hypertension Father   . Hypertension Sister   . Heart disease Paternal Grandmother   . Hypertension Paternal Grandmother   . Crohn's disease Brother   . Diabetes Brother   . Hypertension Brother   . Diabetes Brother   . Colon cancer Maternal Great-grandmother     Social History: Social History   Socioeconomic History  . Marital status: Married    Spouse name: Not on file  .  Number of children: 0  . Years of education: Not on file  . Highest education level: Not on file  Occupational History  . Occupation: retired  Scientific laboratory technician  . Financial resource strain: Not on file  . Food insecurity    Worry: Not on file    Inability: Not on file  . Transportation needs    Medical: Not on file    Non-medical: Not on file  Tobacco Use  . Smoking status: Former Smoker    Packs/day: 1.00    Years: 11.00    Pack years: 11.00    Types: Cigarettes    Quit date: 03/20/1981    Years since quitting: 37.7  . Smokeless tobacco: Never Used  Substance and Sexual Activity  . Alcohol use: Yes    Alcohol/week: 1.0 - 2.0 standard drinks    Types: 1 - 2 Glasses of wine per week  . Drug use: No  . Sexual activity: Yes    Partners: Male    Birth control/protection: Post-menopausal  Lifestyle  . Physical activity    Days per week: Not on file    Minutes per session: Not on file  . Stress: Not on file  Relationships  . Social Herbalist on phone: Not on file    Gets together: Not on file    Attends religious service: Not on file    Active member of club or organization: Not on file    Attends meetings of clubs or organizations: Not on file    Relationship status: Not on file  Other Topics Concern  . Not on file  Social History Narrative  . Not on file    Allergies: Allergies  Allergen Reactions  . Bacitracin Rash  . Benzoic Acid Rash  . Benzyl Salicylate Rash  . Formaldehyde Rash  . Geraniol Rash  . Hydroxyethyl Methacrylate Rash  . Methyl Methacrylate [Methylmethacrylate] Rash  . Other Rash    Amyl Cinnamaldehyde  . Parabens Hives  . Adhesive [Tape] Itching  . Amitiza [Lubiprostone] Hives and Diarrhea  . Aspirin Nausea And Vomiting  . Codeine Nausea And Vomiting  . Victoza [Liraglutide] Diarrhea  . Butanediol [Butylene Glycol] Rash    Outpatient Meds: Current Outpatient Medications  Medication Sig Dispense Refill  . acetaminophen (TYLENOL)  500 MG tablet Take 500 mg by mouth as needed.    . ALPRAZolam (XANAX) 0.5 MG tablet Take 1 tablet (0.5 mg total) by mouth at bedtime as needed for anxiety. 30 tablet 2  . Biotin 10000 MCG TABS Take 1 tablet by mouth daily as needed.    . dicyclomine (BENTYL) 10 MG capsule TAKE 1 CAPSULE(10 MG) BY MOUTH FOUR TIMES DAILY BEFORE MEALS AND AT BEDTIME 60 capsule 3  . estradiol (ESTRACE) 1 MG tablet Take 1 tablet (1 mg total) by mouth daily. 90 tablet 3  . famotidine (PEPCID) 20 MG tablet Take 20 mg by mouth as needed  for heartburn or indigestion.    Marland Kitchen Fexofenadine HCl (WAL-FEX ALLERGY PO) Take by mouth as needed.    Marland Kitchen glimepiride (AMARYL) 2 MG tablet Take 1 tablet (2 mg total) by mouth 2 (two) times daily. 180 tablet 3  . levothyroxine (SYNTHROID, LEVOTHROID) 100 MCG tablet TAKE 1 TABLET(100 MCG) BY MOUTH DAILY BEFORE BREAKFAST 90 tablet 0  . loperamide (IMODIUM A-D) 2 MG tablet Take 2 mg by mouth as needed for diarrhea or loose stools.    Marland Kitchen losartan-hydrochlorothiazide (HYZAAR) 100-12.5 MG tablet Take 1 tablet by mouth daily. 90 tablet 1  . metFORMIN (GLUCOPHAGE-XR) 500 MG 24 hr tablet Take 500 mg by mouth 3 (three) times daily.    . pantoprazole (PROTONIX) 40 MG tablet TAKE 1 TABLET(40 MG) BY MOUTH DAILY 90 tablet 3  . rosuvastatin (CRESTOR) 20 MG tablet TAKE 1 TABLET(20 MG) BY MOUTH DAILY 90 tablet 0  . senna (SENOKOT) 8.6 MG tablet Take 1 tablet by mouth as needed for constipation.    . TRULICITY 1.5 0000000 SOPN INEJCT 1.5 MG WEEKLY 2 mL 4   No current facility-administered medications for this visit.       ___________________________________________________________________ Objective   Exam:  BP 140/86 (BP Location: Left Arm, Patient Position: Sitting, Cuff Size: Large)   Pulse 92   Temp 98.2 F (36.8 C)   Ht 5\' 6"  (1.676 m) Comment: Height measured witout shoes  Wt 220 lb 6 oz (100 kg)   BMI 35.57 kg/m    General: Well-appearing, pleasant and conversational  Eyes: sclera  anicteric, no redness  ENT: oral mucosa moist without lesions, no cervical or supraclavicular lymphadenopathy  CV: RRR without murmur, S1/S2, no JVD, no peripheral edema  Resp: clear to auscultation bilaterally, normal RR and effort noted  GI: soft, obese, upper tenderness, with active bowel sounds. No guarding or palpable organomegaly noted.  Skin; warm and dry, no rash or jaundice noted  Neuro: awake, alert and oriented x 3. Normal gross motor function and fluent speech  Labs:  CBC Latest Ref Rng & Units 11/22/2018 04/16/2018 04/12/2017  WBC 4.0 - 10.5 K/uL 8.2 8.8 8.8  Hemoglobin 12.0 - 15.0 g/dL 14.2 14.0 13.9  Hematocrit 36.0 - 46.0 % 41.4 40.9 40.9  Platelets 150.0 - 400.0 K/uL 309.0 344.0 324.0   CMP Latest Ref Rng & Units 11/22/2018 04/16/2018 10/09/2017  Glucose 70 - 99 mg/dL 218(H) 189(H) 153(H)  BUN 6 - 23 mg/dL 16 13 12   Creatinine 0.40 - 1.20 mg/dL 1.01 0.97 0.84  Sodium 135 - 145 mEq/L 136 137 137  Potassium 3.5 - 5.1 mEq/L 3.7 4.4 3.9  Chloride 96 - 112 mEq/L 97 99 97(L)  CO2 19 - 32 mEq/L 26 28 23   Calcium 8.4 - 10.5 mg/dL 9.4 10.0 9.4  Total Protein 6.0 - 8.3 g/dL 6.4 6.2 6.5  Total Bilirubin 0.2 - 1.2 mg/dL 0.5 0.5 0.6  Alkaline Phos 39 - 117 U/L 67 62 -  AST 0 - 37 U/L 24 19 15   ALT 0 - 35 U/L 23 15 14    B12 low normal at 217  Hemoglobin A1c 9.2, up from 8.0 in January and 7.3 in October of last year. TSH and free T4 normal Negative Cologuard August 2019  Radiologic Studies:  No abdominal imaging  Assessment: Encounter Diagnoses  Name Primary?  Marland Kitchen Upper abdominal pain Yes  . Belching   . Chronic constipation   . Type 2 diabetes mellitus with other specified complication, without long-term current use of insulin (  H[facility])     Chronic constipation, then developed diarrhea after taking cathartic laxatives.  I advised her to stop using the dicyclomine twice daily, even though she seems to feel it has improved the constipation. Probable motility issue, but  must rule out obstruction and neoplastic cause.  Upper abdominal pain of unclear cause.  Does not sound like gastroparesis.  I also described the nature of belching the common cycle of aerophagia.   Plan:  Colonoscopy and upper endoscopy.  She was agreeable after discussion of procedure and risks.  The benefits and risks of the planned procedure were described in detail with the patient or (when appropriate) their health care proxy.  Risks were outlined as including, but not limited to, bleeding, infection, perforation, adverse medication reaction leading to cardiac or pulmonary decompensation.  The limitation of incomplete mucosal visualization was also discussed.  No guarantees or warranties were given.  Split dose bowel preparation, metoclopramide premedication before evening and morning dose.(She reports vomiting with sounds like GoLYTELY preparation from her last colonoscopy.)  MiraLAX 1 capful every other day, increasing frequency and dose as needed.  Discontinue Senokot.  After endoscopic procedures, then may consider prucalopride (tegaserod only approved for female patients under 4)  Thank you for the courtesy of this consult.  Please call me with any questions or concerns.  Nelida Meuse III  CC: Referring provider noted above

## 2018-12-12 ENCOUNTER — Other Ambulatory Visit: Payer: Self-pay

## 2018-12-12 MED ORDER — ESTRADIOL 1 MG PO TABS: 1.0000 mg | ORAL_TABLET | Freq: Every day | ORAL | 0 refills | Status: DC

## 2018-12-12 NOTE — Telephone Encounter (Signed)
Medication refill request: Estradiol Last AEX: 12/27/2017 JJ  Next AEX: 01/02/2019 Last MMG (if hormonal medication request): 04/11/2018 BIRADS 2 Benign Density A Refill authorized: Pending #[phone] with no refills if appropriate. Please advise.

## 2018-12-20 ENCOUNTER — Other Ambulatory Visit: Payer: Self-pay

## 2018-12-20 ENCOUNTER — Encounter: Payer: Self-pay | Admitting: Family Medicine

## 2018-12-20 ENCOUNTER — Ambulatory Visit (INDEPENDENT_AMBULATORY_CARE_PROVIDER_SITE_OTHER): Payer: Medicare Other | Admitting: Family Medicine

## 2018-12-20 VITALS — BP 126/77 | HR 95 | Temp 97.3°F | Ht 66.0 in | Wt 217.2 lb

## 2018-12-20 DIAGNOSIS — E039 Hypothyroidism, unspecified: Secondary | ICD-10-CM | POA: Diagnosis not present

## 2018-12-20 DIAGNOSIS — F419 Anxiety disorder, unspecified: Secondary | ICD-10-CM

## 2018-12-20 DIAGNOSIS — E785 Hyperlipidemia, unspecified: Secondary | ICD-10-CM

## 2018-12-20 DIAGNOSIS — K219 Gastro-esophageal reflux disease without esophagitis: Secondary | ICD-10-CM

## 2018-12-20 DIAGNOSIS — Z6835 Body mass index (BMI) 35.0-35.9, adult: Secondary | ICD-10-CM

## 2018-12-20 DIAGNOSIS — E1159 Type 2 diabetes mellitus with other circulatory complications: Secondary | ICD-10-CM | POA: Diagnosis not present

## 2018-12-20 DIAGNOSIS — E1169 Type 2 diabetes mellitus with other specified complication: Secondary | ICD-10-CM

## 2018-12-20 DIAGNOSIS — I10 Essential (primary) hypertension: Secondary | ICD-10-CM

## 2018-12-20 MED ORDER — LEVOTHYROXINE SODIUM 112 MCG PO TABS: 112.0000 ug | ORAL_TABLET | Freq: Every day | ORAL | 3 refills | Status: DC

## 2018-12-20 NOTE — Assessment & Plan Note (Signed)
Stable with Xanax as needed.

## 2018-12-20 NOTE — Patient Instructions (Signed)
It was very nice to see you today!  We will increase your Synthroid to 112 mcg daily.  Please let me know if you have any negative side effects with this increased dose.  No other changes today.  Come back to see me in 3 months.  We can recheck labs at that time.  Take care, Dr [name]  Please try these tips to maintain a healthy lifestyle:   Eat at least 3 REAL meals and 1-2 snacks per day.  Aim for no more than 5 hours between eating.  If you eat breakfast, please do so within one hour of getting up.    Obtain twice as many fruits/vegetables as protein or carbohydrate foods for both lunch and dinner. (Half of each meal should be fruits/vegetables, one quarter protein, and one quarter starchy carbs)   Cut down on sweet beverages. This includes juice, soda, and sweet tea.    Exercise at least 150 minutes every week.

## 2018-12-20 NOTE — Assessment & Plan Note (Signed)
Will increase dose of Synthroid to 112 mcg daily.  She will follow-up in a couple months and we will recheck TSH at that point.

## 2018-12-20 NOTE — Assessment & Plan Note (Signed)
Continue management per GI.  

## 2018-12-20 NOTE — Assessment & Plan Note (Addendum)
Continue Crestor 20 mg daily.  Last LDL at goal.

## 2018-12-20 NOTE — Assessment & Plan Note (Signed)
Discussed lifestyle modifications.

## 2018-12-20 NOTE — Assessment & Plan Note (Signed)
Last A1c elevated at 9.  She is working on cutting down carbs and working on improving her diet.  She will continue metformin 500 mg 3 times daily, Amaryl 2 mg twice daily, and Trulicity 1.5 mg weekly.  Follow-up with me in 2 to 3 months.  Check A1c at that time.

## 2018-12-20 NOTE — Assessment & Plan Note (Signed)
At goal.  Continue losartan-HCTZ 100-12.5 once daily. 

## 2018-12-20 NOTE — Progress Notes (Signed)
   Chief Complaint:  Brenda Griffin is a 73 y.o. female who presents today with a chief complaint of essential hypertension and to transfer care.   Assessment/Plan:  GERD (gastroesophageal reflux disease) Continue management per GI.  Morbid obesity ([place]) Discussed lifestyle modifications.  Anxiety, Rx Xanax, uses sparingly Stable with Xanax as needed.  Hyperlipidemia associated with type 2 diabetes mellitus (H[facility]), Rx Crestor Continue Crestor 20 mg daily.  Last LDL at goal.   Hypertension associated with diabetes ([place]), Rx Hyzaar At goal.  Continue losartan-HCTZ 100-12.5 once daily.   Hypothyroidism, on Levothyroxine Will increase dose of Synthroid to 112 mcg daily.  She will follow-up in a couple months and we will recheck TSH at that point.  Type 2 diabetes mellitus with other specified complication ([place]), Rx Trulicity, Metformin, Amaryl; cost issues Last A1c elevated at 9.  She is working on cutting down carbs and working on improving her diet.  She will continue metformin 500 mg 3 times daily, Amaryl 2 mg twice daily, and Trulicity 1.5 mg weekly.  Follow-up with me in 2 to 3 months.  Check A1c at that time.    Subjective:  HPI:  Her stable, chronic medical conditions are outlined below:  #Essential hypertension - On losartan-HCTZ 100-12.5 once daily and tolerating well - ROS: No reported chest pain or shortness of breath  #Type 2 diabetes - On metformin 500 mg 3 times daily, Amaryl 2 mg twice daily, and Trulicity 1.5 mg weekly.  Tolerating all well without side effects - ROS: No reported polyuria or polydipsia  #Dyslipidemia - On Crestor 20 mg daily and tolerating well - ROS: No reported myalgias  # Hypothyroidism -On Synthroid 100 mcg daily.  Tolerating well, though still has issues with low energy and fatigue.   # Anxiety - Uses Xanax very sparingly as needed  # GERD - Follows with GI - On protonix 40mg  daily   # Hormone Replacement Therapy - Follows with  GYN - On estrace 1mg  daily  ROS: Per HPI  PMH: She reports that she quit smoking about 37 years ago. Her smoking use included cigarettes. She has a 11.00 pack-year smoking history. She has never used smokeless tobacco. She reports current alcohol use of about 1.0 - 2.0 standard drinks of alcohol per week. She reports that she does not use drugs.      Objective:  Physical Exam: BP 126/77   Pulse 95   Temp (!) 97.3 F (36.3 C)   Ht 5\' 6"  (1.676 m)   Wt 217 lb 4 oz (98.5 kg)   SpO2 98%   BMI 35.07 kg/m   Gen: NAD, resting comfortably CV: Regular rate and rhythm with no murmurs appreciated Pulm: Normal work of breathing, clear to auscultation bilaterally with no crackles, wheezes, or rhonchi GI: Normal bowel sounds present. Soft, Nontender, Nondistended. MSK: No edema, cyanosis, or clubbing noted Skin: Warm, dry Neuro: Grossly normal, moves all extremities Psych: Normal affect and thought content      Tahj Lindseth M. [name], MD 12/20/2018 2:48 PM

## 2018-12-28 ENCOUNTER — Telehealth: Payer: Self-pay | Admitting: Gastroenterology

## 2018-12-28 NOTE — Telephone Encounter (Signed)
Patient called with question about when to take the Reglan that was prescribed for her to take prior to her procedures.  She is scheduled for Monday with Dr. Loletha Carrow for colonoscopy.  Advised to take 30 minutes prior to each dose of prep in order to prevent nausea and vomiting in hopes of tolerating prep well.

## 2018-12-30 ENCOUNTER — Encounter: Payer: Self-pay | Admitting: Gastroenterology

## 2018-12-30 ENCOUNTER — Other Ambulatory Visit: Payer: Self-pay

## 2018-12-30 ENCOUNTER — Ambulatory Visit (AMBULATORY_SURGERY_CENTER): Payer: Medicare Other | Admitting: Gastroenterology

## 2018-12-30 VITALS — BP 116/58 | HR 79 | Temp 98.7°F | Resp 39 | Ht 66.0 in | Wt 220.0 lb

## 2018-12-30 DIAGNOSIS — K529 Noninfective gastroenteritis and colitis, unspecified: Secondary | ICD-10-CM

## 2018-12-30 DIAGNOSIS — R197 Diarrhea, unspecified: Secondary | ICD-10-CM

## 2018-12-30 DIAGNOSIS — R142 Eructation: Secondary | ICD-10-CM

## 2018-12-30 DIAGNOSIS — D120 Benign neoplasm of cecum: Secondary | ICD-10-CM | POA: Diagnosis not present

## 2018-12-30 DIAGNOSIS — D122 Benign neoplasm of ascending colon: Secondary | ICD-10-CM | POA: Diagnosis not present

## 2018-12-30 DIAGNOSIS — K5909 Other constipation: Secondary | ICD-10-CM

## 2018-12-30 DIAGNOSIS — R1084 Generalized abdominal pain: Secondary | ICD-10-CM | POA: Diagnosis not present

## 2018-12-30 DIAGNOSIS — Z1211 Encounter for screening for malignant neoplasm of colon: Secondary | ICD-10-CM | POA: Diagnosis not present

## 2018-12-30 DIAGNOSIS — K317 Polyp of stomach and duodenum: Secondary | ICD-10-CM | POA: Diagnosis not present

## 2018-12-30 MED ORDER — SODIUM CHLORIDE 0.9 % IV SOLN: 500.0000 mL | Freq: Once | INTRAVENOUS | Status: DC

## 2018-12-30 NOTE — Patient Instructions (Addendum)
YOU HAD AN ENDOSCOPIC PROCEDURE TODAY AT THE [facility]:   Refer to the procedure report that was given to you for any specific questions about what was found during the examination.  If the procedure report does not answer your questions, please call your gastroenterologist to clarify.  If you requested that your care partner not be given the details of your procedure findings, then the procedure report has been included in a sealed envelope for you to review at your convenience later.  YOU SHOULD EXPECT: Some feelings of bloating in the abdomen. Passage of more gas than usual.  Walking can help get rid of the air that was put into your GI tract during the procedure and reduce the bloating. If you had a lower endoscopy (such as a colonoscopy or flexible sigmoidoscopy) you may notice spotting of blood in your stool or on the toilet paper. If you underwent a bowel prep for your procedure, you may not have a normal bowel movement for a few days.  Please Note:  You might notice some irritation and congestion in your nose or some drainage.  This is from the oxygen used during your procedure.  There is no need for concern and it should clear up in a day or so.  SYMPTOMS TO REPORT IMMEDIATELY:   Following lower endoscopy (colonoscopy or flexible sigmoidoscopy):  Excessive amounts of blood in the stool  Significant tenderness or worsening of abdominal pains  Swelling of the abdomen that is new, acute  Fever of 100F or higher   Following upper endoscopy (EGD)  Vomiting of blood or coffee ground material  New chest pain or pain under the shoulder blades  Painful or persistently difficult swallowing  New shortness of breath  Fever of 100F or higher  Black, tarry-looking stools  For urgent or emergent issues, a gastroenterologist can be reached at any hour by calling [phone].   DIET:  We do recommend a small meal at first, but then you may proceed to your regular diet.  Drink  plenty of fluids but you should avoid alcoholic beverages for 24 hours.  ACTIVITY:  You should plan to take it easy for the rest of today and you should NOT DRIVE or use heavy machinery until tomorrow (because of the sedation medicines used during the test).    FOLLOW UP: Our staff will call the number listed on your records 48-72 hours following your procedure to check on you and address any questions or concerns that you may have regarding the information given to you following your procedure. If we do not reach you, we will leave a message.  We will attempt to reach you two times.  During this call, we will ask if you have developed any symptoms of COVID 19. If you develop any symptoms (ie: fever, flu-like symptoms, shortness of breath, cough etc.) before then, please call [phone].  If you test positive for Covid 19 in the 2 weeks post procedure, please call and report this information to us.    If any biopsies were taken you will be contacted by phone or by letter within the next 1-3 weeks.  Please call us at [phone] if you have not heard about the biopsies in 3 weeks.    SIGNATURES/CONFIDENTIALITY: You and/or your care partner have signed paperwork which will be entered into your electronic medical record.  These signatures attest to the fact that that the information above on your After Visit Summary has been reviewed and is   understood.  Full responsibility of the confidentiality of this discharge information lies with you and/or your care-partner.    Multiple fundic gland polyps were seen in your stomach.  This is common and benign finding from chronic use of PPI medication (Pantoprazole). Handout was given to you on polyps.  Your blood sugar was 191 in the recovery room. Restart DICYCLOMINE as needed for diarrhea.  Per Dr. Loletha Carrow you had this prescription, but had stopped taking it. You may resume your other current medications today. Await biopsy results. Please call if any  questions or concerns.  ________________________________________________________   Food Guidelines for gas and bloating  Many people have difficulty digesting certain foods, causing a variety of distressing and embarrassing symptoms such as abdominal pain, bloating and gas.  These foods may need to be avoided or consumed in small amounts.  Here are some tips that might be helpful for you.  1.   Lactose intolerance is the difficulty or complete inability to digest lactose, the natural sugar in milk and anything made from milk.  This condition is harmless, common, and can begin any time during life.  Some people can digest a modest amount of lactose while others cannot tolerate any.  Also, not all dairy products contain equal amounts of lactose.  For example, hard cheeses such as parmesan have less lactose than soft cheeses such as cheddar.  Yogurt has less lactose than milk or cheese.  Many packaged foods (even many brands of bread) have milk, so read ingredient lists carefully.  It is difficult to test for lactose intolerance, so just try avoiding lactose as much as possible for a week and see what happens with your symptoms.  If you seem to be lactose intolerant, the best plan is to avoid it (but make sure you get calcium from another source).  The next best thing is to use lactase enzyme supplements, available over the counter everywhere.  Just know that many lactose intolerant people need to take several tablets with each serving of dairy to avoid symptoms.  Lastly, a lot of restaurant food is made with milk or butter.  Many are things you might not suspect, such as mashed potatoes, rice and pasta (cooked with butter) and "grilled" items.  If you are lactose intolerant, it never hurts to ask your server what has milk or butter.  2.   Fiber is an important part of your diet, but not all fiber is well-tolerated.  Insoluble fiber such as bran is often consumed by normal gut bacteria and converted into  gas.  Soluble fiber such as oats, squash, carrots and green beans are typically tolerated better.  3.   Some types of carbohydrates can be poorly digested.  Examples include: fructose (apples, cherries, pears, raisins and other dried fruits), fructans (onions, zucchini, large amounts of wheat), sorbitol/mannitol/xylitol and sucralose/Splenda (common artificial sweeteners), and raffinose (lentils, broccoli, cabbage, asparagus, brussel sprouts, many types of beans).  Do a Development worker, community for [employer] and you will find helpful information. Beano, a dietary supplement, will often help with raffinose-containing foods.  As with lactase tablets, you may need several per serving.  4.   Whenever possible, avoid processed food&meats and chemical additives.  High fructose corn syrup, a common sweetener, may be difficult to digest.  Eggs and soy (comes from the soybean, and added to many foods now) are other common bloating/gassy foods.  - Dr. Herma [facility]

## 2018-12-30 NOTE — Progress Notes (Signed)
LC- Temp [place]- Vitals

## 2018-12-30 NOTE — Progress Notes (Signed)
To PACU, VSS. Report to [email or web address] 

## 2018-12-30 NOTE — Op Note (Signed)
[place] Patient Name: Brenda Griffin Procedure Date: 12/30/2018 3:55 PM MRN: [id number] Endoscopist: [place]. Loletha Carrow , MD Age: 73 Referring MD:  Date of Birth: 1945-10-25 Gender: Female Account #: [id number] Procedure:                Colonoscopy Indications:              Generalized abdominal pain, Constipation                            (alternating with diarrhea) Medicines:                Monitored Anesthesia Care Procedure:                Pre-Anesthesia Assessment:                           - Prior to the procedure, a History and Physical                            was performed, and patient medications and                            allergies were reviewed. The patient's tolerance of                            previous anesthesia was also reviewed. The risks                            and benefits of the procedure and the sedation                            options and risks were discussed with the patient.                            All questions were answered, and informed consent                            was obtained. Prior Anticoagulants: The patient has                            taken no previous anticoagulant or antiplatelet                            agents. ASA Grade Assessment: III - A patient with                            severe systemic disease. After reviewing the risks                            and benefits, the patient was deemed in                            satisfactory condition to undergo the procedure.  After obtaining informed consent, the colonoscope                            was passed under direct vision. Throughout the                            procedure, the patient's blood pressure, pulse, and                            oxygen saturations were monitored continuously. The                            LOANER 0255 was introduced through the anus and                            advanced to the the cecum, identified by                             appendiceal orifice and ileocecal valve. The                            colonoscopy was performed with difficulty due to                            fair bowel prep, a redundant colon and significant                            looping. Successful completion of the procedure was                            aided by using manual pressure. The patient                            tolerated the procedure well. The quality of the                            bowel preparation was fair (PATIENT DID NOT TAKE AM                            PREP DOSE). The ileocecal valve, appendiceal                            orifice, and rectum were photographed. Scope In: 4:01:48 PM Scope Out: 4:32:49 PM Scope Withdrawal Time: 0 hours 24 minutes 28 seconds  Total Procedure Duration: 0 hours 31 minutes 1 second  Findings:                 The perianal and digital rectal examinations were                            normal.                           Two sessile polyps were found in the ascending  colon and cecum. The polyps were 4 to 8 mm in size,                            respectively. These polyps were removed with a cold                            snare. Resection and retrieval were complete.                           Normal mucosa was found in the entire colon.                            Biopsies for histology were taken with a cold                            forceps from the right colon and left colon for                            evaluation of microscopic colitis.                           A 15 mm polyp was found in the mid ascending colon.                            The polyp was sessile. The polyp was removed with a                            hot snare. Resection and retrieval were complete.                           A 12 mm polyp was found in the distal ascending                            colon. The polyp was sessile. The polyp was removed                             with a hot and cold snare. Resection and retrieval                            were complete.                           Retroflexion in the rectum was not performed due to                            anatomy.                           The exam was otherwise without abnormality. Complications:            No immediate complications. Estimated Blood Loss:     Estimated blood loss was minimal. Impression:               -  Preparation of the colon was fair.                           - Two 4 to 8 mm polyps in the ascending colon and                            in the cecum, removed with a cold snare. Resected                            and retrieved.                           - Normal mucosa in the entire examined colon.                            Biopsied.                           - One 15 mm polyp in the mid ascending colon,                            removed with a hot snare. Resected and retrieved.                           - One 12 mm polyp in the distal ascending colon,                            removed with a hot snare. Resected and retrieved.                           - The examination was otherwise normal. Recommendation:           - Patient has a contact number available for                            emergencies. The signs and symptoms of potential                            delayed complications were discussed with the                            patient. Return to normal activities tomorrow.                            Written discharge instructions were provided to the                            patient.                           - Resume previous diet.                           - Continue present medications, dicyclomine as  needed for diarrhea.                           - Await pathology results.                           - Repeat colonoscopy is recommended for                            surveillance. The colonoscopy date will be                             determined after pathology results from today's                            exam become available for review.                           - See the other procedure note for documentation of                            additional recommendations. Henry L. Loletha Carrow, MD 12/30/2018 4:51:41 PM This report has been signed electronically.

## 2018-12-30 NOTE — Progress Notes (Signed)
Dr. Loletha Carrow added to pt's AVS Diet to help with Flatus & Bloating.  No problems noted in the recovery room.  maw

## 2018-12-30 NOTE — Op Note (Signed)
[place] Patient Name: Brenda Griffin Procedure Date: 12/30/2018 3:54 PM MRN: [id number] Endoscopist: [place]. Loletha Carrow , MD Age: 73 Referring MD:  Date of Birth: 1945-06-11 Gender: Female Account #: [id number] Procedure:                Upper GI endoscopy Indications:              Generalized abdominal pain, Eructation Medicines:                Monitored Anesthesia Care Procedure:                Pre-Anesthesia Assessment:                           - Prior to the procedure, a History and Physical                            was performed, and patient medications and                            allergies were reviewed. The patient's tolerance of                            previous anesthesia was also reviewed. The risks                            and benefits of the procedure and the sedation                            options and risks were discussed with the patient.                            All questions were answered, and informed consent                            was obtained. Prior Anticoagulants: The patient has                            taken no previous anticoagulant or antiplatelet                            agents. ASA Grade Assessment: III - A patient with                            severe systemic disease. After reviewing the risks                            and benefits, the patient was deemed in                            satisfactory condition to undergo the procedure.                           After obtaining informed consent, the endoscope was  passed under direct vision. Throughout the                            procedure, the patient's blood pressure, pulse, and                            oxygen saturations were monitored continuously. The                            Endoscope was introduced through the mouth, and                            advanced to the second part of duodenum. The upper                            GI endoscopy  was accomplished without difficulty.                            The patient tolerated the procedure well. Scope In: Scope Out: Findings:                 The larynx was normal.                           The esophagus was normal.                           Multiple sessile fundic gland polyps were found in                            the gastric fundus and in the gastric body.                           The exam of the stomach was otherwise normal.                           The examined duodenum was normal. Complications:            No immediate complications. Estimated Blood Loss:     Estimated blood loss: none. Impression:               - Normal larynx.                           - Normal esophagus.                           - Multiple fundic gland polyps. Common and benign                            finding from chronic use of PPI medicine (i.e.                            pantoprazole)                           - Normal examined duodenum.                           -  No specimens collected. Recommendation:           - Patient has a contact number available for                            emergencies. The signs and symptoms of potential                            delayed complications were discussed with the                            patient. Return to normal activities tomorrow.                            Written discharge instructions were provided to the                            patient.                           - Resume previous diet.                           - Continue present medications.                           - See the other procedure note for documentation of                            additional recommendations. Aashritha Miedema L. Loletha Carrow, MD 12/30/2018 4:54:30 PM This report has been signed electronically.

## 2018-12-31 ENCOUNTER — Encounter: Payer: Self-pay | Admitting: Family Medicine

## 2018-12-31 ENCOUNTER — Other Ambulatory Visit: Payer: Self-pay

## 2018-12-31 DIAGNOSIS — E785 Hyperlipidemia, unspecified: Secondary | ICD-10-CM

## 2018-12-31 DIAGNOSIS — E1169 Type 2 diabetes mellitus with other specified complication: Secondary | ICD-10-CM

## 2018-12-31 MED ORDER — ROSUVASTATIN CALCIUM 20 MG PO TABS: 20.0000 mg | ORAL_TABLET | Freq: Every day | ORAL | 3 refills | Status: DC

## 2018-12-31 NOTE — Progress Notes (Addendum)
73 y.o. G0P0000 Married White or Caucasian Not Hispanic or Latino female here for annual exam.  H/O TAH, on ERT. She has felt miserable when she has cut back on ERT, can't use the patch (adhesive allergy). Everything dried out, eye, skin. Night sweats were horrible, couldn't sleep. Hot flashes, sleep disturbance. She is already using eye drops. Skin already cracks and bleeds and was dryer off of HRT.     No LMP recorded. Patient is postmenopausal.          Sexually active: Yes.    The current method of family planning is post menopausal status.    Exercising: No.  The patient does not participate in regular exercise at present. Smoker:  no  Health Maintenance: Pap: 2015 WNL per patient  History of abnormal Pap: Yes- Cervical Cone biopsy  MMG: 04/11/2018 Birads 2 benign Colonoscopy: 12/30/2018 IBS, polyps BMD: 04/05/2016 WNL  TDaP: 04/21/2013 Gardasil: N/A   reports that she quit smoking about 37 years ago. Her smoking use included cigarettes. She has a 11.00 pack-year smoking history. She has never used smokeless tobacco. She reports current alcohol use. She reports that she does not use drugs. Rare ETOH. Retired, moved here in 3/18 from [place]  Past Medical History:  Diagnosis Date  . Anxiety   . Basal cell carcinoma   . Cataract   . Diabetes mellitus without complication ([place])   . Fibroid   . Gallstones   . GERD (gastroesophageal reflux disease)   . Heart murmur   . Hypercholesterolemia   . Hypertension   . IBS (irritable bowel syndrome)   . Infertility, female   . Pneumonia   . Skin cancer   . Thyroid disease     Past Surgical History:  Procedure Laterality Date  . ABDOMINAL HYSTERECTOMY  1997   oophorectomy  . BUNIONECTOMY Bilateral 1997, 2001   right 1997, left 2001  . CATARACT EXTRACTION Bilateral 2006  . CERVICAL CONE BIOPSY  1976  . EYE SURGERY    . GALLBLADDER SURGERY    . LUMBAR DISC SURGERY    . MOHS SURGERY  2005/2007  . PILONIDAL CYST EXCISION  1975   . TONSILLECTOMY  1957    Current Outpatient Medications  Medication Sig Dispense Refill  . acetaminophen (TYLENOL) 500 MG tablet Take 500 mg by mouth as needed.    . ALPRAZolam (XANAX) 0.5 MG tablet Take 1 tablet (0.5 mg total) by mouth at bedtime as needed for anxiety. 30 tablet 2  . Biotin 10000 MCG TABS Take 1 tablet by mouth daily as needed.    Marland Kitchen estradiol (ESTRACE) 1 MG tablet Take 1 tablet (1 mg total) by mouth daily. 90 tablet 0  . famotidine (PEPCID) 20 MG tablet Take 20 mg by mouth as needed for heartburn or indigestion.    Marland Kitchen Fexofenadine HCl (WAL-FEX ALLERGY PO) Take by mouth as needed.    Marland Kitchen glimepiride (AMARYL) 2 MG tablet Take 1 tablet (2 mg total) by mouth 2 (two) times daily. 180 tablet 3  . levothyroxine (SYNTHROID) 112 MCG tablet Take 1 tablet (112 mcg total) by mouth daily before breakfast. 90 tablet 3  . loperamide (IMODIUM A-D) 2 MG tablet Take 2 mg by mouth as needed for diarrhea or loose stools.    Marland Kitchen losartan-hydrochlorothiazide (HYZAAR) 100-12.5 MG tablet Take 1 tablet by mouth daily. 90 tablet 1  . metFORMIN (GLUCOPHAGE-XR) 500 MG 24 hr tablet Take 500 mg by mouth 3 (three) times daily.    . pantoprazole (PROTONIX)  40 MG tablet TAKE 1 TABLET(40 MG) BY MOUTH DAILY 90 tablet 3  . rosuvastatin (CRESTOR) 20 MG tablet Take 1 tablet (20 mg total) by mouth daily. 90 tablet 3  . senna (SENOKOT) 8.6 MG tablet Take 1 tablet by mouth as needed for constipation.    . TRULICITY 1.5 0000000 SOPN INEJCT 1.5 MG WEEKLY 2 mL 4   No current facility-administered medications for this visit.     Family History  Problem Relation Age of Onset  . Stroke Mother   . Heart disease Father   . Hyperlipidemia Father   . Hypertension Father   . Hypertension Sister   . Heart disease Paternal Grandmother   . Hypertension Paternal Grandmother   . Crohn's disease Brother   . Diabetes Brother   . Hypertension Brother   . Diabetes Brother   . Colon cancer Maternal Great-grandmother      Review of Systems  Constitutional: Negative.   HENT: Negative.   Eyes: Negative.   Respiratory: Negative.   Cardiovascular: Negative.   Gastrointestinal: Negative.   Endocrine: Negative.   Genitourinary: Negative.   Musculoskeletal: Negative.   Skin: Negative.   Neurological: Negative.   Hematological: Negative.   Psychiatric/Behavioral: Negative.     Exam:   BP 132/82 (BP Location: Right Arm, Patient Position: Sitting, Cuff Size: Normal)   Pulse 84   Temp 97.9 F (36.6 C) (Skin)   Ht 5\' 6"  (1.676 m)   Wt 218 lb (98.9 kg)   BMI 35.19 kg/m   Weight change: @WEIGHTCHANGE @ Height:   Height: 5\' 6"  (167.6 cm)  Ht Readings from Last 3 Encounters:  01/02/19 5\' 6"  (1.676 m)  12/30/18 5\' 6"  (1.676 m)  12/20/18 5\' 6"  (1.676 m)    General appearance: alert, cooperative and appears stated age Head: Normocephalic, without obvious abnormality, atraumatic Neck: no adenopathy, supple, symmetrical, trachea midline and thyroid normal to inspection and palpation Lungs: clear to auscultation bilaterally Cardiovascular: regular rate and rhythm Breasts: normal appearance, no masses or tenderness Abdomen: soft, non-tender; non distended,  no masses,  no organomegaly Extremities: extremities normal, atraumatic, no cyanosis or edema Skin: Skin color, texture, turgor normal. No rashes or lesions Lymph nodes: Cervical, supraclavicular, and axillary nodes normal. No abnormal inguinal nodes palpated Neurologic: Grossly normal   Pelvic: External genitalia:  no lesions              Urethra:  normal appearing urethra with no masses, tenderness or lesions              Bartholins and Skenes: normal                 Vagina: normal appearing vagina with normal color and discharge, no lesions              Cervix: absent               Bimanual Exam:  Uterus:  uterus absent              Adnexa: no mass, fullness, tenderness               Rectovaginal: Confirms               Anus:  normal  sphincter tone, no lesions  Chaperone was present for exam.  A:  Well Woman with normal exam  Multiple medical problems, including: DM, HTN, elevated lipids  ERT  The patient has a 25% risk of CVD, discussed that ERT not recommended. She understands, but  would rather enjoy whatever life she has left. Aware of risk of MI, she would still like to stay on ERT.   P:   Will try changing her to the divigel 0.05 mg daily (she can't tolerate the patch), hope this will decrease her risk some.   Mammogram in 1/21  Colonoscopy just done  DEXA UTD and normal  Discussed breast self exam  Discussed calcium and vit D intake  Pap with hpv  Labs with primary   Pap from vaginal apex

## 2019-01-01 ENCOUNTER — Telehealth: Payer: Self-pay

## 2019-01-01 NOTE — Telephone Encounter (Signed)
  Follow up Call-  Call back number 12/30/2018  Post procedure Call Back phone  # [phone]  Permission to leave phone message Yes     Patient questions:  Do you have a fever, pain , or abdominal swelling? No. Pain Score  0 *  Have you tolerated food without any problems? Yes.    Have you been able to return to your normal activities? Yes.    Do you have any questions about your discharge instructions: Diet   No. Medications  No. Follow up visit  No.  Do you have questions or concerns about your Care? No.  Actions: * If pain score is 4 or above: 1. No action needed, pain <4.Have you developed a fever since your procedure? no  2.   Have you had an respiratory symptoms (SOB or cough) since your procedure? no  3.   Have you tested positive for COVID 19 since your procedure no  4.   Have you had any family members/close contacts diagnosed with the COVID 19 since your procedure?  no   If yes to any of these questions please route to Joylene John, RN and Alphonsa Gin, Therapist, sports.

## 2019-01-02 ENCOUNTER — Other Ambulatory Visit (HOSPITAL_COMMUNITY)
Admission: RE | Admit: 2019-01-02 | Discharge: 2019-01-02 | Disposition: A | Discharge status: Home / Self Care | Payer: Medicare Other | Source: Ambulatory Visit | Attending: Obstetrics and Gynecology | Admitting: Obstetrics and Gynecology

## 2019-01-02 ENCOUNTER — Other Ambulatory Visit: Payer: Self-pay

## 2019-01-02 ENCOUNTER — Encounter: Payer: Self-pay | Admitting: Obstetrics and Gynecology

## 2019-01-02 ENCOUNTER — Ambulatory Visit (INDEPENDENT_AMBULATORY_CARE_PROVIDER_SITE_OTHER): Payer: Medicare Other | Admitting: Obstetrics and Gynecology

## 2019-01-02 VITALS — BP 132/82 | HR 84 | Temp 97.9°F | Ht 66.0 in | Wt 218.0 lb

## 2019-01-02 DIAGNOSIS — Z8639 Personal history of other endocrine, nutritional and metabolic disease: Secondary | ICD-10-CM

## 2019-01-02 DIAGNOSIS — Z01419 Encounter for gynecological examination (general) (routine) without abnormal findings: Secondary | ICD-10-CM

## 2019-01-02 DIAGNOSIS — Z1272 Encounter for screening for malignant neoplasm of vagina: Secondary | ICD-10-CM

## 2019-01-02 DIAGNOSIS — H35033 Hypertensive retinopathy, bilateral: Secondary | ICD-10-CM | POA: Diagnosis not present

## 2019-01-02 DIAGNOSIS — Z1151 Encounter for screening for human papillomavirus (HPV): Secondary | ICD-10-CM | POA: Diagnosis not present

## 2019-01-02 DIAGNOSIS — E038 Other specified hypothyroidism: Secondary | ICD-10-CM | POA: Diagnosis not present

## 2019-01-02 DIAGNOSIS — I152 Hypertension secondary to endocrine disorders: Secondary | ICD-10-CM

## 2019-01-02 DIAGNOSIS — Z780 Asymptomatic menopausal state: Secondary | ICD-10-CM | POA: Diagnosis not present

## 2019-01-02 DIAGNOSIS — E785 Hyperlipidemia, unspecified: Secondary | ICD-10-CM

## 2019-01-02 DIAGNOSIS — Z79890 Hormone replacement therapy (postmenopausal): Secondary | ICD-10-CM

## 2019-01-02 DIAGNOSIS — Z5181 Encounter for therapeutic drug level monitoring: Secondary | ICD-10-CM | POA: Diagnosis not present

## 2019-01-02 DIAGNOSIS — I10 Essential (primary) hypertension: Secondary | ICD-10-CM

## 2019-01-02 DIAGNOSIS — E1159 Type 2 diabetes mellitus with other circulatory complications: Secondary | ICD-10-CM

## 2019-01-02 DIAGNOSIS — E1169 Type 2 diabetes mellitus with other specified complication: Secondary | ICD-10-CM

## 2019-01-02 MED ORDER — DIVIGEL 0.5 MG/0.5GM TD GEL: 1.0000 | Freq: Every day | TRANSDERMAL | 3 refills | Status: DC

## 2019-01-02 NOTE — Addendum Note (Signed)
Addended by: Dorothy Spark on: 01/02/2019 04:32 PM   Modules accepted: Orders

## 2019-01-02 NOTE — Patient Instructions (Signed)
EXERCISE AND DIET:  We recommended that you start or continue a regular exercise program for good health. Regular exercise means any activity that makes your heart beat faster and makes you sweat.  We recommend exercising at least 30 minutes per day at least 3 days a week, preferably 4 or 5.  We also recommend a diet low in fat and sugar.  Inactivity, poor dietary choices and obesity can cause diabetes, heart attack, stroke, and kidney damage, among others.    ALCOHOL AND SMOKING:  Women should limit their alcohol intake to no more than 7 drinks/beers/glasses of wine (combined, not each!) per week. Moderation of alcohol intake to this level decreases your risk of breast cancer and liver damage. And of course, no recreational drugs are part of a healthy lifestyle.  And absolutely no smoking or even second hand smoke. Most people know smoking can cause heart and lung diseases, but did you know it also contributes to weakening of your bones? Aging of your skin?  Yellowing of your teeth and nails?  CALCIUM AND VITAMIN D:  Adequate intake of calcium and Vitamin D are recommended.  The recommendations for exact amounts of these supplements seem to change often, but generally speaking 1200 mg of calcium (between diet and supplement) and 800 units of Vitamin D per day seems prudent. Certain women may benefit from higher intake of Vitamin D.  If you are among these women, your doctor will have told you during your visit.    PAP SMEARS:  Pap smears, to check for cervical cancer or precancers,  have traditionally been done yearly, although recent scientific advances have shown that most women can have pap smears less often.  However, every woman still should have a physical exam from her gynecologist every year. It will include a breast check, inspection of the vulva and vagina to check for abnormal growths or skin changes, a visual exam of the cervix, and then an exam to evaluate the size and shape of the uterus and  ovaries.  And after 71 years of age, a rectal exam is indicated to check for rectal cancers. We will also provide age appropriate advice regarding health maintenance, like when you should have certain vaccines, screening for sexually transmitted diseases, bone density testing, colonoscopy, mammograms, etc.   MAMMOGRAMS:  All women over 72 years old should have a yearly mammogram. Many facilities now offer a "3D" mammogram, which may cost around $50 extra out of pocket. If possible,  we recommend you accept the option to have the 3D mammogram performed.  It both reduces the number of women who will be called back for extra views which then turn out to be normal, and it is better than the routine mammogram at detecting truly abnormal areas.    COLON CANCER SCREENING: Now recommend starting at age 19. At this time colonoscopy is not covered for routine screening until 50. There are take home tests that can be done between 45-49.   COLONOSCOPY:  Colonoscopy to screen for colon cancer is recommended for all women at age 68.  We know, you hate the idea of the prep.  We agree, BUT, having colon cancer and not knowing it is worse!!  Colon cancer so often starts as a polyp that can be seen and removed at colonscopy, which can quite literally save your life!  And if your first colonoscopy is normal and you have no family history of colon cancer, most women don't have to have it again for  10 years.  Once every ten years, you can do something that may end up saving your life, right?  We will be happy to help you get it scheduled when you are ready.  Be sure to check your insurance coverage so you understand how much it will cost.  It may be covered as a preventative service at no cost, but you should check your particular policy.   ° ° ° °Breast Self-Awareness °Breast self-awareness means being familiar with how your breasts look and feel. It involves checking your breasts regularly and reporting any changes to your  health care provider. °Practicing breast self-awareness is important. A change in your breasts can be a sign of a serious medical problem. Being familiar with how your breasts look and feel allows you to find any problems early, when treatment is more likely to be successful. All women should practice breast self-awareness, including women who have had breast implants. °How to do a breast self-exam °One way to learn what is normal for your breasts and whether your breasts are changing is to do a breast self-exam. To do a breast self-exam: °Look for Changes ° °1. Remove all the clothing above your waist. °2. Stand in front of a mirror in a room with good lighting. °3. Put your hands on your hips. °4. Push your hands firmly downward. °5. Compare your breasts in the mirror. Look for differences between them (asymmetry), such as: °? Differences in shape. °? Differences in size. °? Puckers, dips, and bumps in one breast and not the other. °6. Look at each breast for changes in your skin, such as: °? Redness. °? Scaly areas. °7. Look for changes in your nipples, such as: °? Discharge. °? Bleeding. °? Dimpling. °? Redness. °? A change in position. °Feel for Changes °Carefully feel your breasts for lumps and changes. It is best to do this while lying on your back on the floor and again while sitting or standing in the shower or tub with soapy water on your skin. Feel each breast in the following way: °· Place the arm on the side of the breast you are examining above your head. °· Feel your breast with the other hand. °· Start in the nipple area and make ¾ inch (2 cm) overlapping circles to feel your breast. Use the pads of your three middle fingers to do this. Apply light pressure, then medium pressure, then firm pressure. The light pressure will allow you to feel the tissue closest to the skin. The medium pressure will allow you to feel the tissue that is a little deeper. The firm pressure will allow you to feel the tissue  close to the ribs. °· Continue the overlapping circles, moving downward over the breast until you feel your ribs below your breast. °· Move one finger-width toward the center of the body. Continue to use the ¾ inch (2 cm) overlapping circles to feel your breast as you move slowly up toward your collarbone. °· Continue the up and down exam using all three pressures until you reach your armpit. ° °Write Down What You Find ° °Write down what is normal for each breast and any changes that you find. Keep a written record with breast changes or normal findings for each breast. By writing this information down, you do not need to depend only on memory for size, tenderness, or location. Write down where you are in your menstrual cycle, if you are still menstruating. °If you are having trouble noticing differences   in your breasts, do not get discouraged. With time you will become more familiar with the variations in your breasts and more comfortable with the exam. °How often should I examine my breasts? °Examine your breasts every month. If you are breastfeeding, the best time to examine your breasts is after a feeding or after using a breast pump. If you menstruate, the best time to examine your breasts is 5-7 days after your period is over. During your period, your breasts are lumpier, and it may be more difficult to notice changes. °When should I see my health care provider? °See your health care provider if you notice: °· A change in shape or size of your breasts or nipples. °· A change in the skin of your breast or nipples, such as a reddened or scaly area. °· Unusual discharge from your nipples. °· A lump or thick area that was not there before. °· Pain in your breasts. °· Anything that concerns you. ° ° °Menopause and Hormone Replacement Therapy °Menopause is a normal time of life when menstrual periods stop completely and the ovaries stop producing the female hormones estrogen and progesterone. This lack of hormones  can affect your health and cause undesirable symptoms. Hormone replacement therapy (HRT) can relieve some of those symptoms. °What is hormone replacement therapy? °HRT is the use of artificial (synthetic) hormones to replace hormones that your body has stopped producing because you have reached menopause. °What are my options for HRT? ° °HRT may consist of the synthetic hormones estrogen and progestin, or it may consist of only estrogen (estrogen-only therapy). You and your health care provider will decide which form of HRT is best for you. °If you choose to be on HRT and you have a uterus, estrogen and progestin are usually prescribed. Estrogen-only therapy is used for women who do not have a uterus. °Possible options for taking HRT include: °· Pills. °· Patches. °· Gels. °· Sprays. °· Vaginal cream. °· Vaginal rings. °· Vaginal inserts. °The amount of hormone(s) that you take and how long you take the hormone(s) varies according to your health. It is important to: °· Begin HRT with the lowest possible dosage. °· Stop HRT as soon as your health care provider tells you to stop. °· Work with your health care provider so that you feel informed and comfortable with your decisions. °What are the benefits of HRT? °HRT can reduce the frequency and severity of menopausal symptoms. Benefits of HRT vary according to the kind of symptoms that you have, how severe they are, and your overall health. HRT may help to improve the following symptoms of menopause: °· Hot flashes and night sweats. These are sudden feelings of heat that spread over the face and body. The skin may turn red, like a blush. Night sweats are hot flashes that happen while you are sleeping or trying to sleep. °· Bone loss (osteoporosis). The body loses calcium more quickly after menopause, causing the bones to become weaker. This can increase the risk for bone breaks (fractures). °· Vaginal dryness. The lining of the vagina can become thin and dry, which can  cause pain during sex or cause infection, burning, or itching. °· Urinary tract infections. °· Urinary incontinence. This is the inability to control when you pass urine. °· Irritability. °· Short-term memory problems. °What are the risks of HRT? °Risks of HRT vary depending on your individual health and medical history. Risks of HRT also depend on whether you receive both estrogen and progestin or   receive estrogen only. HRT may increase the risk of:  Spotting. This is when a small amount of blood leaks from the vagina unexpectedly.  Endometrial cancer. This cancer is in the lining of the uterus (endometrium).  Breast cancer.  Increased density of breast tissue. This can make it harder to find breast cancer on a breast X-ray (mammogram).  Stroke.  Heart disease.  Blood clots.  Gallbladder disease.  Liver disease. Risks of HRT can increase if you have any of the following conditions:  Endometrial cancer.  Liver disease.  Heart disease.  Breast cancer.  History of blood clots.  History of stroke. Follow these instructions at home:  Take over-the-counter and prescription medicines only as told by your health care provider.  Get mammograms, pelvic exams, and medical checkups as often as told by your health care provider.  Have Pap tests done as often as told by your health care provider. A Pap test is sometimes called a Pap smear. It is a screening test that is used to check for signs of cancer of the cervix and vagina. A Pap test can also identify the presence of infection or precancerous changes. Pap tests may be done: ? Every 3 years, starting at age 46. ? Every 5 years, starting after age 67, in combination with testing for human papillomavirus (HPV). ? More often or less often depending on other medical conditions you have, your age, and other risk factors.  It is up to you to get the results of your Pap test. Ask your health care provider, or the department that is  doing the test, when your results will be ready.  Keep all follow-up visits as told by your health care provider. This is important. Contact a health care provider if you have:  Pain or swelling in your legs.  Shortness of breath.  Chest pain.  Lumps or changes in your breasts or armpits.  Slurred speech.  Pain, burning, or bleeding when you urinate.  Unusual vaginal bleeding.  Dizziness or headaches.  Weakness or numbness in any part of your arms or legs.  Pain in your abdomen. Summary  Menopause is a normal time of life when menstrual periods stop completely and the ovaries stop producing the female hormones estrogen and progesterone.  Hormone replacement therapy (HRT) can relieve some of the symptoms of menopause.  HRT can reduce the frequency and severity of menopausal symptoms.  Risks of HRT vary depending on your individual health and medical history. This information is not intended to replace advice given to you by your health care provider. Make sure you discuss any questions you have with your health care provider. Document Released: 12/03/2002 Document Revised: 11/06/2017 Document Reviewed: 11/06/2017 Elsevier Patient Education  2020 [employer].

## 2019-01-06 ENCOUNTER — Encounter: Payer: Self-pay | Admitting: Obstetrics and Gynecology

## 2019-01-06 ENCOUNTER — Encounter: Payer: Self-pay | Admitting: Family Medicine

## 2019-01-06 ENCOUNTER — Other Ambulatory Visit: Payer: Self-pay | Admitting: Obstetrics and Gynecology

## 2019-01-06 ENCOUNTER — Other Ambulatory Visit: Payer: Self-pay

## 2019-01-06 DIAGNOSIS — E119 Type 2 diabetes mellitus without complications: Secondary | ICD-10-CM

## 2019-01-06 MED ORDER — TRULICITY 1.5 MG/0.5ML ~~LOC~~ SOAJ: SUBCUTANEOUS | 4 refills | Status: DC

## 2019-01-06 NOTE — Telephone Encounter (Signed)
Patient sent the following correspondence through [place].  [facility] has told me that my insurance company needs more information (pre-authorization) before they will approve the prescription for Divigel.  [facility] told me that they sent you a message explaining what is needed.  I realize that your office probably just received this message, but I just wanted to make sure that you had received it.  If there is a problem, please let me know.  Thank you.

## 2019-01-07 ENCOUNTER — Encounter: Payer: Self-pay | Admitting: Gastroenterology

## 2019-01-07 NOTE — Telephone Encounter (Signed)
PA submitted to plan for Divigel via [email or web address]  Key: [id number] - PA Case ID: [id number]

## 2019-01-08 LAB — CYTOLOGY - PAP
Comment: NEGATIVE
Diagnosis: NEGATIVE
High risk HPV: NEGATIVE

## 2019-01-10 ENCOUNTER — Encounter: Payer: Self-pay | Admitting: Obstetrics and Gynecology

## 2019-01-10 ENCOUNTER — Telehealth: Payer: Self-pay | Admitting: Obstetrics and Gynecology

## 2019-01-10 NOTE — Telephone Encounter (Signed)
Deniedon October 21 Request Reference Number: [id number]. DIVIGEL GEL 0.5MG  is denied for not meeting the prior authorization requirement(s). For further questions, call [phone]. Appeals are not supported through [place]. Please refer to the fax case notice for appeals information and instructions.   See telephone encounter dated 01/10/19.   Encounter closed.

## 2019-01-10 NOTE — Telephone Encounter (Signed)
Spoke with patient. Advised Dr. Talbert Nan is out of the office, will f/u with recommendations on 10/26 when she returns. Patient states she is not out of medication, agreeable to plan.   Dr. Talbert Nan -please advise on Rx for Estrace 1mg  po tab.

## 2019-01-10 NOTE — Telephone Encounter (Signed)
Patient sent the following message through [place]. Routing to triage to assist patient with request.  Brenda Griffin, Brenda Griffin [facility]  Phone Number: [phone]        I need a prescription for Estrace 1 mg. The Divigel you prescribed which I said I would try is not covered by my insurance. It would cost me over $500 per prescription and that is simply too much. I am aware of the risk of continuing to take estrogen and I have so informed my husband. We both agree that I should continue on the estrogen.   I would appreciate it if you would let me know when the prescription has been sent. Please use my normal pharmacy, the [facility] on [name] in [place].   Thank you.

## 2019-01-13 ENCOUNTER — Other Ambulatory Visit: Payer: Self-pay

## 2019-01-13 ENCOUNTER — Ambulatory Visit (INDEPENDENT_AMBULATORY_CARE_PROVIDER_SITE_OTHER): Payer: Medicare Other | Admitting: Ophthalmology

## 2019-01-13 ENCOUNTER — Encounter (INDEPENDENT_AMBULATORY_CARE_PROVIDER_SITE_OTHER): Payer: Self-pay | Admitting: Ophthalmology

## 2019-01-13 DIAGNOSIS — E113293 Type 2 diabetes mellitus with mild nonproliferative diabetic retinopathy without macular edema, bilateral: Secondary | ICD-10-CM | POA: Diagnosis not present

## 2019-01-13 DIAGNOSIS — H35033 Hypertensive retinopathy, bilateral: Secondary | ICD-10-CM | POA: Diagnosis not present

## 2019-01-13 DIAGNOSIS — H3581 Retinal edema: Secondary | ICD-10-CM | POA: Diagnosis not present

## 2019-01-13 DIAGNOSIS — H32 Chorioretinal disorders in diseases classified elsewhere: Secondary | ICD-10-CM

## 2019-01-13 DIAGNOSIS — I10 Essential (primary) hypertension: Secondary | ICD-10-CM

## 2019-01-13 DIAGNOSIS — Z961 Presence of intraocular lens: Secondary | ICD-10-CM

## 2019-01-13 DIAGNOSIS — B399 Histoplasmosis, unspecified: Secondary | ICD-10-CM | POA: Diagnosis not present

## 2019-01-13 DIAGNOSIS — Z8669 Personal history of other diseases of the nervous system and sense organs: Secondary | ICD-10-CM

## 2019-01-13 MED ORDER — ESTRADIOL 1 MG PO TABS: 1.0000 mg | ORAL_TABLET | Freq: Every day | ORAL | 3 refills | Status: DC

## 2019-01-13 NOTE — Progress Notes (Signed)
[facility] Note  01/13/2019     CHIEF COMPLAINT Patient presents for Retina Follow Up   HISTORY OF PRESENT ILLNESS: Brenda Griffin is a 73 y.o. female who presents to the clinic today for:   HPI    Retina Follow Up    Patient presents with  Diabetic Retinopathy.  In both eyes.  Severity is moderate.  Duration of 6 months.  Since onset it is stable.  I, the attending physician,  performed the HPI with the patient and updated documentation appropriately.          Comments    Patient states vision seems the same OU. Doesn't remember last BS reading, only checks once a week. Last a1c was 9.0, about 2 months ago.        Last edited by Bernarda Caffey, MD on 01/15/2019 10:25 PM. (History)    pt states her A1c was 9.2 in September, pt states she has been eating a lot of carbs recently, but she is trying to do better, pt takes glimepiride, metformin and trulicity, pt denies fol, but says she has the same floaters   Referring physician: Briscoe Deutscher, [address] [place],  [place] [address]  HISTORICAL INFORMATION:   Selected notes from the MEDICAL RECORD NUMBER Referred by Dr. Briscoe Deutscher for DM exam LEE:  Ocular Hx-cataracts PMH-anxiety, DM ([id number]: 8.0, takes trulicity, metformin, glimepiride), heart murmur, HLD, HTN, skin cancer, thyroid disease    CURRENT MEDICATIONS: No current outpatient medications on file. (Ophthalmic Drugs)   No current facility-administered medications for this visit.  (Ophthalmic Drugs)   Current Outpatient Medications (Other)  Medication Sig  . acetaminophen (TYLENOL) 500 MG tablet Take 500 mg by mouth as needed.  . ALPRAZolam (XANAX) 0.5 MG tablet Take 1 tablet (0.5 mg total) by mouth at bedtime as needed for anxiety.  . Biotin 10000 MCG TABS Take 1 tablet by mouth daily as needed.  . Dulaglutide (TRULICITY) 1.5 0000000 SOPN INEJCT 1.5 MG WEEKLY  . estradiol (ESTRACE) 1 MG tablet Take 1 tablet (1 mg total) by  mouth daily.  . famotidine (PEPCID) 20 MG tablet Take 20 mg by mouth as needed for heartburn or indigestion.  Marland Kitchen Fexofenadine HCl (WAL-FEX ALLERGY PO) Take by mouth as needed.  Marland Kitchen levothyroxine (SYNTHROID) 112 MCG tablet Take 1 tablet (112 mcg total) by mouth daily before breakfast.  . loperamide (IMODIUM A-D) 2 MG tablet Take 2 mg by mouth as needed for diarrhea or loose stools.  Marland Kitchen losartan-hydrochlorothiazide (HYZAAR) 100-12.5 MG tablet Take 1 tablet by mouth daily.  . metFORMIN (GLUCOPHAGE-XR) 500 MG 24 hr tablet Take 500 mg by mouth 3 (three) times daily.  . pantoprazole (PROTONIX) 40 MG tablet TAKE 1 TABLET(40 MG) BY MOUTH DAILY  . rosuvastatin (CRESTOR) 20 MG tablet Take 1 tablet (20 mg total) by mouth daily.  Marland Kitchen senna (SENOKOT) 8.6 MG tablet Take 1 tablet by mouth as needed for constipation.  Marland Kitchen glimepiride (AMARYL) 2 MG tablet Take 1 tablet (2 mg total) by mouth 2 (two) times daily.   No current facility-administered medications for this visit.  (Other)      REVIEW OF SYSTEMS: ROS    Positive for: Gastrointestinal, Endocrine, Eyes   Negative for: Constitutional, Neurological, Skin, Genitourinary, Musculoskeletal, HENT, Cardiovascular, Respiratory, Psychiatric, Allergic/Imm, Heme/Lymph   Last edited by Roselee Nova D, COT on 01/13/2019  1:09 PM. (History)       ALLERGIES Allergies  Allergen Reactions  . Bacitracin  Rash  . Benzoic Acid Rash  . Benzyl Salicylate Rash  . Formaldehyde Rash  . Geraniol Rash  . Hydroxyethyl Methacrylate Rash  . Methyl Methacrylate [Methylmethacrylate] Rash  . Other Rash    Amyl Cinnamaldehyde  . Parabens Hives  . Adhesive [Tape] Itching  . Amitiza [Lubiprostone] Hives and Diarrhea  . Aspirin Nausea And Vomiting  . Codeine Nausea And Vomiting  . Victoza [Liraglutide] Diarrhea  . Butanediol [Butylene Glycol] Rash    PAST MEDICAL HISTORY Past Medical History:  Diagnosis Date  . Anxiety   . Basal cell carcinoma   . Cataract   . Diabetes  mellitus without complication ([place])   . Fibroid   . Gallstones   . GERD (gastroesophageal reflux disease)   . Heart murmur   . Hypercholesterolemia   . Hypertension   . IBS (irritable bowel syndrome)   . Infertility, female   . Pneumonia   . Skin cancer   . Thyroid disease    Past Surgical History:  Procedure Laterality Date  . ABDOMINAL HYSTERECTOMY  1997   oophorectomy  . BUNIONECTOMY Bilateral 1997, 2001   right 1997, left 2001  . CATARACT EXTRACTION Bilateral 2006  . CERVICAL CONE BIOPSY  1976  . EYE SURGERY    . GALLBLADDER SURGERY    . LUMBAR DISC SURGERY    . MOHS SURGERY  2005/2007  . PILONIDAL CYST EXCISION  1975  . TONSILLECTOMY  1957    FAMILY HISTORY Family History  Problem Relation Age of Onset  . Stroke Mother   . Heart disease Father   . Hyperlipidemia Father   . Hypertension Father   . Hypertension Sister   . Heart disease Paternal Grandmother   . Hypertension Paternal Grandmother   . Crohn's disease Brother   . Diabetes Brother   . Hypertension Brother   . Diabetes Brother   . Colon cancer Maternal Great-grandmother     SOCIAL HISTORY Social History   Tobacco Use  . Smoking status: Former Smoker    Packs/day: 1.00    Years: 11.00    Pack years: 11.00    Types: Cigarettes    Quit date: 03/20/1981    Years since quitting: 37.8  . Smokeless tobacco: Never Used  Substance Use Topics  . Alcohol use: Yes    Comment: socially  . Drug use: No         OPHTHALMIC EXAM:  Base Eye Exam    Visual Acuity (Snellen - Linear)      Right Left   Dist [place] 20/25 20/20 -2   Dist ph cc 20/20 NI       Tonometry (Tonopen, 1:13 PM)      Right Left   Pressure 17 17       Pupils      Dark Light Shape React APD   Right 3 2 Round Brisk None   Left 3 2 Round Brisk None       Visual Fields (Counting fingers)      Left Right    Full Full       Extraocular Movement      Right Left    Full, Ortho Full, Ortho       Neuro/Psych    Oriented  x3: Yes   Mood/Affect: Normal       Dilation    Both eyes: 1.0% Mydriacyl, 2.5% Phenylephrine @ 1:13 PM        Slit Lamp and Fundus Exam    Slit Lamp Exam  Right Left   Lids/Lashes Dermatochalasis - upper lid, Meibomian gland dysfunction Dermatochalasis - upper lid, Meibomian gland dysfunction   Conjunctiva/Sclera White and quiet White and quiet   Cornea 1+ Punctate epithelial erosions, Well healed cataract wounds 1+ Punctate epithelial erosions, Well healed cataract wounds   Anterior Chamber Deep and quiet Deep and quiet   Iris Round and dilated, No NVI Round and dilated, No NVI   Lens PC IOL in good position, trace Posterior capsular opacification toric PC IOL in good position with marks at 0600 and 1200, trace Posterior capsular opacification   Vitreous Vitreous syneresis, Posterior vitreous detachment, vitreous condensations Vitreous syneresis, Posterior vitreous detachment, Weiss ring       Fundus Exam      Right Left   Disc Compact, temporal Peripapillary atrophy Tilted disc, temporal Peripapillary atrophy   C/D Ratio 0.1 0.1   Macula Flat, Blunted foveal reflex, mild RPE mottling, trace Epiretinal membrane, No heme or edema, blonde fundus Flat, Blunted foveal reflex, Retinal pigment epithelial mottling and clumping, no heme or edema, blonde fundus   Vessels Vascular attenuation Vascular attenuation   Periphery Attached, rare MA, pigmented paving stone inferiorly, blonde peripheral fundus Attached, 3 pigmented histo spots along 1030 meridian midzone, rare MA, blonde peripheral fundus          IMAGING AND PROCEDURES  Imaging and Procedures for @TODAY @  OCT, Retina - OU - Both Eyes       Right Eye Quality was good. Central Foveal Thickness: 289. Progression has been stable. Findings include normal foveal contour, no SRF, no IRF, vitreomacular adhesion , myopic contour (Mild ERM).   Left Eye Quality was good. Central Foveal Thickness: 276. Progression has been  stable. Findings include normal foveal contour, no SRF, no IRF, myopic contour (Mild ERM, trace drusen).   Notes *Images captured and stored on drive  Diagnosis / Impression:  NFP, No IRF/SRF OU Trace ERM OU No DME OU   Clinical management:  See below  Abbreviations: NFP - Normal foveal profile. CME - cystoid macular edema. PED - pigment epithelial detachment. IRF - intraretinal fluid. SRF - subretinal fluid. EZ - ellipsoid zone. ERM - epiretinal membrane. ORA - outer retinal atrophy. ORT - outer retinal tubulation. SRHM - subretinal hyper-reflective material                 ASSESSMENT/PLAN:    ICD-10-CM   1. Mild nonproliferative diabetic retinopathy of both eyes without macular edema associated with type 2 diabetes mellitus ([place])  [id number]   2. Retinal edema  H35.81 OCT, Retina - OU - Both Eyes  3. Essential hypertension  I10   4. Hypertensive retinopathy of both eyes  H35.033   5. 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 Pseudophakia of both eyes  Z96.1   7. History of myopia  Z86.69     1,2. Mild nonproliferative diabetic retinopathy w/o DME, OU  - The incidence, risk factors for progression, natural history and treatment options for diabetic retinopathy were discussed with patient.    - The need for close monitoring of blood glucose, blood pressure, and serum lipids, avoiding cigarette or any type of tobacco, and the need for long term follow up was also discussed with patient.  - exam shows rare, scattered peripheral MA's OU  - OCT without diabetic macular edema, OU  - f/u in 9 mos  3,4. Hypertensive retinopathy OU  - discussed importance of tight BP control  - monitor  5.  Histoplasmosis OS  - focal peripapillary pigmented CR  - 3 pigmented spots along 1030 meridian midzone  - no active inflammation  - long standing per pt  - monitor  6. Pseudophakia OU  - s/p CE/IOL OU  - beautiful surgeries, doing well  - done in [place], [place], 2015  -  monitor  7. History of high myopia  - corrected with cataract surgery  - discussed association with RT/RD  - monitor   Ophthalmic Meds Ordered this visit:  No orders of the defined types were placed in this encounter.      Return in about 9 months (around 10/13/2019) for f/u NPDR OU, DFE, OCT.  There are no Patient Instructions on file for this visit.   Explained the diagnoses, plan, and follow up with the patient and they expressed understanding.  Patient expressed understanding of the importance of proper follow up care.     Electronically signed by: Leeann Must, COA   Gardiner Sleeper, M.D., Ph.D. Diseases & Surgery of the Retina and Vitreous [facility]  I have reviewed the above documentation for accuracy and completeness, and I agree with the above. Gardiner Sleeper, M.D., Ph.D. 01/15/19 10:32 PM   Abbreviations: M myopia (nearsighted); A astigmatism; H hyperopia (farsighted); P presbyopia; Mrx spectacle prescription;  CTL contact lenses; OD right eye; OS left eye; OU both eyes  XT exotropia; ET esotropia; PEK punctate epithelial keratitis; PEE punctate epithelial erosions; DES dry eye syndrome; MGD meibomian gland dysfunction; ATs artificial tears; PFAT's preservative free artificial tears; [place] nuclear sclerotic cataract; PSC posterior subcapsular cataract; ERM epi-retinal membrane; PVD posterior vitreous detachment; RD retinal detachment; DM diabetes mellitus; DR diabetic retinopathy; NPDR non-proliferative diabetic retinopathy; PDR proliferative diabetic retinopathy; CSME clinically significant macular edema; DME diabetic macular edema; dbh dot blot hemorrhages; CWS cotton wool spot; POAG primary open angle glaucoma; C/D cup-to-disc ratio; HVF humphrey visual field; GVF goldmann visual field; OCT optical coherence tomography; IOP intraocular pressure; BRVO Branch retinal vein occlusion; CRVO central retinal vein occlusion; CRAO central retinal artery  occlusion; BRAO branch retinal artery occlusion; RT retinal tear; SB scleral buckle; PPV pars plana vitrectomy; VH Vitreous hemorrhage; PRP panretinal laser photocoagulation; IVK intravitreal kenalog; VMT vitreomacular traction; MH Macular hole;  NVD neovascularization of the disc; NVE neovascularization elsewhere; AREDS age related eye disease study; ARMD age related macular degeneration; POAG primary open angle glaucoma; EBMD epithelial/anterior basement membrane dystrophy; ACIOL anterior chamber intraocular lens; IOL intraocular lens; PCIOL posterior chamber intraocular lens; Phaco/IOL phacoemulsification with intraocular lens placement; [place] photorefractive keratectomy; LASIK laser assisted in situ keratomileusis; HTN hypertension; DM diabetes mellitus; COPD chronic obstructive pulmonary disease

## 2019-01-13 NOTE — Telephone Encounter (Signed)
Please let the patient know that I refilled her oral estrogen tablets, 1 mg daily (sent in a years worth). I would recommend that she try taking 1/2 a tablet again and see if that's tolerable.

## 2019-01-13 NOTE — Telephone Encounter (Signed)
Spoke with patient, advised per Dr. Jertson. Patient verbalizes understanding and is agreeable.   Encounter closed.  

## 2019-01-15 ENCOUNTER — Other Ambulatory Visit: Payer: Self-pay

## 2019-01-15 ENCOUNTER — Encounter (INDEPENDENT_AMBULATORY_CARE_PROVIDER_SITE_OTHER): Payer: Self-pay | Admitting: Ophthalmology

## 2019-01-15 ENCOUNTER — Encounter: Payer: Self-pay | Admitting: Family Medicine

## 2019-01-15 MED ORDER — GLIMEPIRIDE 2 MG PO TABS: 2.0000 mg | ORAL_TABLET | Freq: Two times a day (BID) | ORAL | 3 refills | Status: DC

## 2019-01-17 ENCOUNTER — Ambulatory Visit (INDEPENDENT_AMBULATORY_CARE_PROVIDER_SITE_OTHER): Payer: Medicare Other

## 2019-01-17 ENCOUNTER — Other Ambulatory Visit: Payer: Self-pay

## 2019-01-17 DIAGNOSIS — Z0000 Encounter for general adult medical examination without abnormal findings: Secondary | ICD-10-CM

## 2019-01-17 NOTE — Progress Notes (Signed)
I have personally reviewed the Medicare Annual Wellness Visit and agree with the assessment and plan.  [name]. [name], MD 01/17/2019 4:18 PM

## 2019-01-17 NOTE — Patient Instructions (Signed)
Ms. Brenda Griffin , Thank you for taking time to come for your Medicare Wellness Visit. I appreciate your ongoing commitment to your health goals. Please review the following plan we discussed and let me know if I can assist you in the future.   Screening recommendations/referrals: Colorectal Screening: up to date; colonoscopy 12/30/18 with Dr. Loletha Carrow  Mammogram: up to date; last 04/11/18 Bone Density: up to date; last 04/05/16  Vision and Dental Exams: Recommended annual ophthalmology exams for early detection of glaucoma and other disorders of the eye Recommended annual dental exams for proper oral hygiene  Diabetic Exams: Diabetic Eye Exam: recommended yearly; up to date  Diabetic Foot Exam: up to date; last 04/16/18  Vaccinations: Influenza vaccine: completed 11/29/18 Pneumococcal vaccine: up to date; last 02/20/17  Tdap vaccine: up to date; last 04/21/13  Shingles vaccine: Shingrix completed   Advanced directives: Please bring a copy of your POA (Power of [place]) and/or Living Will to your next appointment.  Goals: Recommend to drink at least 6-8 8oz glasses of water per day and consume a balanced diet rich in fresh fruits and vegetables.  Work to cut back on carbohydrates.   Next appointment: Please schedule your Annual Wellness Visit with your Nurse Health Advisor in one year.  Preventive Care 72 Years and Older, Female Preventive care refers to lifestyle choices and visits with your health care provider that can promote health and wellness. What does preventive care include?  A yearly physical exam. This is also called an annual well check.  Dental exams once or twice a year.  Routine eye exams. Ask your health care provider how often you should have your eyes checked.  Personal lifestyle choices, including:  Daily care of your teeth and gums.  Regular physical activity.  Eating a healthy diet.  Avoiding tobacco and drug use.  Limiting alcohol use.  Practicing safe sex.   Taking low-dose aspirin every day if recommended by your health care provider.  Taking vitamin and mineral supplements as recommended by your health care provider. What happens during an annual well check? The services and screenings done by your health care provider during your annual well check will depend on your age, overall health, lifestyle risk factors, and family history of disease. Counseling  Your health care provider may ask you questions about your:  Alcohol use.  Tobacco use.  Drug use.  Emotional well-being.  Home and relationship well-being.  Sexual activity.  Eating habits.  History of falls.  Memory and ability to understand (cognition).  Work and work Statistician.  Reproductive health. Screening  You may have the following tests or measurements:  Height, weight, and BMI.  Blood pressure.  Lipid and cholesterol levels. These may be checked every 5 years, or more frequently if you are over 46 years old.  Skin check.  Lung cancer screening. You may have this screening every year starting at age 32 if you have a 30-pack-year history of smoking and currently smoke or have quit within the past 15 years.  Fecal occult blood test (FOBT) of the stool. You may have this test every year starting at age 16.  Flexible sigmoidoscopy or colonoscopy. You may have a sigmoidoscopy every 5 years or a colonoscopy every 10 years starting at age 67.  Hepatitis C blood test.  Hepatitis B blood test.  Sexually transmitted disease (STD) testing.  Diabetes screening. This is done by checking your blood sugar (glucose) after you have not eaten for a while (fasting). You may have  this done every 1-3 years.  Bone density scan. This is done to screen for osteoporosis. You may have this done starting at age 1.  Mammogram. This may be done every 1-2 years. Talk to your health care provider about how often you should have regular mammograms. Talk with your health care  provider about your test results, treatment options, and if necessary, the need for more tests. Vaccines  Your health care provider may recommend certain vaccines, such as:  Influenza vaccine. This is recommended every year.  Tetanus, diphtheria, and acellular pertussis (Tdap, Td) vaccine. You may need a Td booster every 10 years.  Zoster vaccine. You may need this after age 87.  Pneumococcal 13-valent conjugate (PCV13) vaccine. One dose is recommended after age 41.  Pneumococcal polysaccharide (PPSV23) vaccine. One dose is recommended after age 69. Talk to your health care provider about which screenings and vaccines you need and how often you need them. This information is not intended to replace advice given to you by your health care provider. Make sure you discuss any questions you have with your health care provider. Document Released: 04/02/2015 Document Revised: 11/24/2015 Document Reviewed: 01/05/2015 Elsevier Interactive Patient Education  2017 [place] Prevention in the Home Falls can cause injuries. They can happen to people of all ages. There are many things you can do to make your home safe and to help prevent falls. What can I do on the outside of my home?  Regularly fix the edges of walkways and driveways and fix any cracks.  Remove anything that might make you trip as you walk through a door, such as a raised step or threshold.  Trim any bushes or trees on the path to your home.  Use bright outdoor lighting.  Clear any walking paths of anything that might make someone trip, such as rocks or tools.  Regularly check to see if handrails are loose or broken. Make sure that both sides of any steps have handrails.  Any raised decks and porches should have guardrails on the edges.  Have any leaves, snow, or ice cleared regularly.  Use sand or salt on walking paths during winter.  Clean up any spills in your garage right away. This includes oil or grease  spills. What can I do in the bathroom?  Use night lights.  Install grab bars by the toilet and in the tub and shower. Do not use towel bars as grab bars.  Use non-skid mats or decals in the tub or shower.  If you need to sit down in the shower, use a plastic, non-slip stool.  Keep the floor dry. Clean up any water that spills on the floor as soon as it happens.  Remove soap buildup in the tub or shower regularly.  Attach bath mats securely with double-sided non-slip rug tape.  Do not have throw rugs and other things on the floor that can make you trip. What can I do in the bedroom?  Use night lights.  Make sure that you have a light by your bed that is easy to reach.  Do not use any sheets or blankets that are too big for your bed. They should not hang down onto the floor.  Have a firm chair that has side arms. You can use this for support while you get dressed.  Do not have throw rugs and other things on the floor that can make you trip. What can I do in the kitchen?  Clean up any  spills right away.  Avoid walking on wet floors.  Keep items that you use a lot in easy-to-reach places.  If you need to reach something above you, use a strong step stool that has a grab bar.  Keep electrical cords out of the way.  Do not use floor polish or wax that makes floors slippery. If you must use wax, use non-skid floor wax.  Do not have throw rugs and other things on the floor that can make you trip. What can I do with my stairs?  Do not leave any items on the stairs.  Make sure that there are handrails on both sides of the stairs and use them. Fix handrails that are broken or loose. Make sure that handrails are as long as the stairways.  Check any carpeting to make sure that it is firmly attached to the stairs. Fix any carpet that is loose or worn.  Avoid having throw rugs at the top or bottom of the stairs. If you do have throw rugs, attach them to the floor with carpet  tape.  Make sure that you have a light switch at the top of the stairs and the bottom of the stairs. If you do not have them, ask someone to add them for you. What else can I do to help prevent falls?  Wear shoes that:  Do not have high heels.  Have rubber bottoms.  Are comfortable and fit you well.  Are closed at the toe. Do not wear sandals.  If you use a stepladder:  Make sure that it is fully opened. Do not climb a closed stepladder.  Make sure that both sides of the stepladder are locked into place.  Ask someone to hold it for you, if possible.  Clearly mark and make sure that you can see:  Any grab bars or handrails.  First and last steps.  Where the edge of each step is.  Use tools that help you move around (mobility aids) if they are needed. These include:  Canes.  Walkers.  Scooters.  Crutches.  Turn on the lights when you go into a dark area. Replace any light bulbs as soon as they burn out.  Set up your furniture so you have a clear path. Avoid moving your furniture around.  If any of your floors are uneven, fix them.  If there are any pets around you, be aware of where they are.  Review your medicines with your doctor. Some medicines can make you feel dizzy. This can increase your chance of falling. Ask your doctor what other things that you can do to help prevent falls. This information is not intended to replace advice given to you by your health care provider. Make sure you discuss any questions you have with your health care provider. Document Released: 12/31/2008 Document Revised: 08/12/2015 Document Reviewed: 04/10/2014 Elsevier Interactive Patient Education  2017 [employer].

## 2019-01-17 NOTE — Progress Notes (Signed)
I connected with Brenda Griffin on 01/17/19 at 1330 by phone and verified that I am speaking with the correct person using two identifiers. Location patient: Home Location provider: [facility], Office Persons participating in the virtual visit: Denman George LPN and Dr. Dimas Chyle    I discussed the limitations of evaluation and management by telemedicine and the availability of in person appointments. The patient expressed understanding and agreed to proceed.  Subjective:   Brenda Griffin is a 73 y.o. female who presents for Medicare Annual (Subsequent) preventive examination.  Review of Systems:   Cardiac Risk Factors include: advanced age (>73men, >49 women);diabetes mellitus;dyslipidemia;hypertension;sedentary lifestyle;obesity (BMI >30kg/m2)     Objective:     Vitals: There were no vitals taken for this visit.  There is no height or weight on file to calculate BMI.  Advanced Directives 01/17/2019 01/16/2017  Does Patient Have a Medical Advance Directive? No No  Would patient like information on creating a medical advance directive? Yes (MAU/Ambulatory/Procedural Areas - Information given) Yes (MAU/Ambulatory/Procedural Areas - Information given)    Tobacco Social History   Tobacco Use  Smoking Status Former Smoker  . Packs/day: 1.00  . Years: 11.00  . Pack years: 11.00  . Types: Cigarettes  . Quit date: 03/20/1981  . Years since quitting: 37.8  Smokeless Tobacco Never Used     Counseling given: Not Answered   Clinical Intake:  Pre-visit preparation completed: Yes  Pain : No/denies pain  Diabetes: Yes CBG done?: No Did pt. bring in CBG monitor from home?: No  How often do you need to have someone help you when you read instructions, pamphlets, or other written materials from your doctor or pharmacy?: 1 - Never  Interpreter Needed?: No  Information entered by :: Denman George LPN  Past Medical History:  Diagnosis Date  . Anxiety   . Basal cell  carcinoma   . Cataract   . Diabetes mellitus without complication ([place])   . Fibroid   . Gallstones   . GERD (gastroesophageal reflux disease)   . Heart murmur   . Hypercholesterolemia   . Hypertension   . IBS (irritable bowel syndrome)   . Infertility, female   . Pneumonia   . Skin cancer   . Thyroid disease    Past Surgical History:  Procedure Laterality Date  . ABDOMINAL HYSTERECTOMY  1997   oophorectomy  . BUNIONECTOMY Bilateral 1997, 2001   right 1997, left 2001  . CATARACT EXTRACTION Bilateral 2006  . CERVICAL CONE BIOPSY  1976  . EYE SURGERY    . GALLBLADDER SURGERY    . LUMBAR DISC SURGERY    . MOHS SURGERY  2005/2007  . PILONIDAL CYST EXCISION  1975  . TONSILLECTOMY  1957   Family History  Problem Relation Age of Onset  . Stroke Mother   . Heart disease Father   . Hyperlipidemia Father   . Hypertension Father   . Hypertension Sister   . Heart disease Paternal Grandmother   . Hypertension Paternal Grandmother   . Crohn's disease Brother   . Diabetes Brother   . Hypertension Brother   . Diabetes Brother   . Colon cancer Maternal Great-grandmother    Social History   Socioeconomic History  . Marital status: Married    Spouse name: Not on file  . Number of children: 0  . Years of education: Not on file  . Highest education level: Not on file  Occupational History  . Occupation: retired  Scientific laboratory technician  .  Financial resource strain: Not on file  . Food insecurity    Worry: Not on file    Inability: Not on file  . Transportation needs    Medical: Not on file    Non-medical: Not on file  Tobacco Use  . Smoking status: Former Smoker    Packs/day: 1.00    Years: 11.00    Pack years: 11.00    Types: Cigarettes    Quit date: 03/20/1981    Years since quitting: 37.8  . Smokeless tobacco: Never Used  Substance and Sexual Activity  . Alcohol use: Yes    Comment: socially  . Drug use: No  . Sexual activity: Yes    Partners: Male    Birth  control/protection: Post-menopausal  Lifestyle  . Physical activity    Days per week: Not on file    Minutes per session: Not on file  . Stress: Not on file  Relationships  . Social Herbalist on phone: Not on file    Gets together: Not on file    Attends religious service: Not on file    Active member of club or organization: Not on file    Attends meetings of clubs or organizations: Not on file    Relationship status: Not on file  Other Topics Concern  . Not on file  Social History Narrative  . Not on file    Outpatient Encounter Medications as of 01/17/2019  Medication Sig  . acetaminophen (TYLENOL) 500 MG tablet Take 500 mg by mouth as needed.  . ALPRAZolam (XANAX) 0.5 MG tablet Take 1 tablet (0.5 mg total) by mouth at bedtime as needed for anxiety.  . Biotin 10000 MCG TABS Take 1 tablet by mouth daily as needed.  . Dulaglutide (TRULICITY) 1.5 0000000 SOPN INEJCT 1.5 MG WEEKLY  . estradiol (ESTRACE) 1 MG tablet Take 1 tablet (1 mg total) by mouth daily.  . famotidine (PEPCID) 20 MG tablet Take 20 mg by mouth as needed for heartburn or indigestion.  Marland Kitchen Fexofenadine HCl (WAL-FEX ALLERGY PO) Take by mouth as needed.  Marland Kitchen glimepiride (AMARYL) 2 MG tablet Take 1 tablet (2 mg total) by mouth 2 (two) times daily.  Marland Kitchen levothyroxine (SYNTHROID) 112 MCG tablet Take 1 tablet (112 mcg total) by mouth daily before breakfast.  . loperamide (IMODIUM A-D) 2 MG tablet Take 2 mg by mouth as needed for diarrhea or loose stools.  Marland Kitchen losartan-hydrochlorothiazide (HYZAAR) 100-12.5 MG tablet Take 1 tablet by mouth daily.  . metFORMIN (GLUCOPHAGE-XR) 500 MG 24 hr tablet Take 500 mg by mouth 3 (three) times daily.  . pantoprazole (PROTONIX) 40 MG tablet TAKE 1 TABLET(40 MG) BY MOUTH DAILY  . rosuvastatin (CRESTOR) 20 MG tablet Take 1 tablet (20 mg total) by mouth daily.  Marland Kitchen senna (SENOKOT) 8.6 MG tablet Take 1 tablet by mouth as needed for constipation.   No facility-administered encounter  medications on file as of 01/17/2019.     Activities of Daily Living In your present state of health, do you have any difficulty performing the following activities: 01/17/2019  Hearing? N  Vision? N  Difficulty concentrating or making decisions? N  Walking or climbing stairs? N  Dressing or bathing? N  Doing errands, shopping? N  Preparing Food and eating ? N  Using the Toilet? N  In the past six months, have you accidently leaked urine? N  Do you have problems with loss of bowel control? N  Managing your Medications? N  Managing your  Finances? N  Housekeeping or managing your Housekeeping? N  Some recent data might be hidden    Patient Care Team: Vivi Barrack, MD as PCP - General (Family Medicine) Salvadore Dom, MD as Consulting Physician (Obstetrics and Gynecology) Mammography, [place] (Diagnostic Radiology) Bernarda Caffey, MD as Consulting Physician (Ophthalmology) Loletha Carrow Kirke Corin, MD as Consulting Physician (Gastroenterology)    Assessment:   This is a routine wellness examination for [facility].  Exercise Activities and Dietary recommendations Current Exercise Habits: The patient does not participate in regular exercise at present  Goals    . HEMOGLOBIN A1C < 7    . Walk 3 times per week.       Fall Risk Fall Risk  01/17/2019 04/16/2018 10/09/2017 01/16/2017 10/11/2016  Falls in the past year? 0 0 No No No  Number falls in past yr: - 0 - - -  Injury with Fall? 0 0 - - -  Follow up Falls evaluation completed;Education provided;Falls prevention discussed - - - -   Is the patient's home free of loose throw rugs in walkways, pet beds, electrical cords, etc?   yes      Grab bars in the bathroom? yes      Handrails on the stairs?   yes      Adequate lighting?   yes  Depression Screen PHQ 2/9 Scores 01/17/2019 04/16/2018 10/09/2017 01/16/2017  PHQ - 2 Score 0 0 0 1  PHQ- 9 Score - 5 3 -     Cognitive Function: no cognitive concerns at this time    6CIT  Screen 01/17/2019  What Year? 0 points  What month? 0 points  What time? 0 points  Count back from 20 0 points  Months in reverse 0 points  Repeat phrase 0 points  Total Score 0    Immunization History  Administered Date(s) Administered  . Influenza, High Dose Seasonal PF 01/10/2017, 01/07/2018  . Influenza-Unspecified 12/07/2015  . Pneumococcal Conjugate-13 02/20/2017  . Pneumococcal Polysaccharide-23 01/09/2013  . Tdap 04/21/2013  . Zoster 04/08/2015  . Zoster Recombinat (Shingrix) 10/20/2017, 12/18/2017    Qualifies for Shingles Vaccine?Shingrix completed   Screening Tests Health Maintenance  Topic Date Due  . OPHTHALMOLOGY EXAM  05/30/2018  . FOOT EXAM  04/17/2019  . HEMOGLOBIN A1C  05/22/2019  . MAMMOGRAM  04/11/2020  . Fecal DNA (Cologuard)  10/30/2020  . TETANUS/TDAP  04/22/2023  . INFLUENZA VACCINE  Completed  . DEXA SCAN  Completed  . Hepatitis C Screening  Completed  . PNA vac Low Risk Adult  Completed    Cancer Screenings: Lung: Low Dose CT Chest recommended if Age 69-80 years, 30 pack-year currently smoking OR have quit w/in 15years. Patient does not qualify. Breast:  Up to date on Mammogram? Yes   Up to date of Bone Density/Dexa? Yes Colorectal: colonoscopy 12/30/18 with Dr. Loletha Carrow     Plan:  I have personally reviewed and addressed the Medicare Annual Wellness questionnaire and have noted the following in the patient's chart:  A. Medical and social history B. Use of alcohol, tobacco or illicit drugs  C. Current medications and supplements D. Functional ability and status E.  Nutritional status F.  Physical activity G. Advance directives H. List of other physicians I.  Hospitalizations, surgeries, and ER visits in previous 12 months J.  [place] such as hearing and vision if needed, cognitive and depression L. Referrals, records requested, and appointments- will request diabetic eye exam notes   In addition, I  have reviewed and  discussed with patient certain preventive protocols, quality metrics, and best practice recommendations. A written personalized care plan for preventive services as well as general preventive health recommendations were provided to patient.   Signed,  Denman George, LPN  Nurse Health Advisor   Nurse Notes: Patient states that she will be unable to take Metformin ER after she completes her current rx.  She states that she was told that it is unavailable due to recall and she is unable to take regular Metformin.  She also states that she has been having problems with carb intake in the last few months.

## 2019-02-27 ENCOUNTER — Encounter: Payer: Self-pay | Admitting: Family Medicine

## 2019-02-27 ENCOUNTER — Other Ambulatory Visit: Payer: Self-pay

## 2019-02-27 MED ORDER — GLIMEPIRIDE 2 MG PO TABS: 2.0000 mg | ORAL_TABLET | Freq: Two times a day (BID) | ORAL | 3 refills | Status: DC

## 2019-03-11 ENCOUNTER — Other Ambulatory Visit: Payer: Self-pay

## 2019-03-11 ENCOUNTER — Other Ambulatory Visit: Payer: Self-pay | Admitting: Family Medicine

## 2019-03-11 ENCOUNTER — Encounter: Payer: Self-pay | Admitting: Family Medicine

## 2019-03-11 DIAGNOSIS — K219 Gastro-esophageal reflux disease without esophagitis: Secondary | ICD-10-CM

## 2019-03-11 MED ORDER — PANTOPRAZOLE SODIUM 40 MG PO TBEC: DELAYED_RELEASE_TABLET | ORAL | 3 refills | Status: DC

## 2019-03-24 ENCOUNTER — Other Ambulatory Visit: Payer: Self-pay

## 2019-03-24 ENCOUNTER — Ambulatory Visit (INDEPENDENT_AMBULATORY_CARE_PROVIDER_SITE_OTHER): Payer: Medicare Other | Admitting: Family Medicine

## 2019-03-24 ENCOUNTER — Encounter: Payer: Self-pay | Admitting: Family Medicine

## 2019-03-24 VITALS — BP 122/80 | HR 89 | Temp 97.7°F | Ht 66.0 in | Wt 217.0 lb

## 2019-03-24 DIAGNOSIS — J310 Chronic rhinitis: Secondary | ICD-10-CM

## 2019-03-24 DIAGNOSIS — I152 Hypertension secondary to endocrine disorders: Secondary | ICD-10-CM

## 2019-03-24 DIAGNOSIS — E1159 Type 2 diabetes mellitus with other circulatory complications: Secondary | ICD-10-CM | POA: Diagnosis not present

## 2019-03-24 DIAGNOSIS — I10 Essential (primary) hypertension: Secondary | ICD-10-CM | POA: Diagnosis not present

## 2019-03-24 DIAGNOSIS — K5900 Constipation, unspecified: Secondary | ICD-10-CM | POA: Diagnosis not present

## 2019-03-24 DIAGNOSIS — E1169 Type 2 diabetes mellitus with other specified complication: Secondary | ICD-10-CM

## 2019-03-24 LAB — POCT GLYCOSYLATED HEMOGLOBIN (HGB A1C): Hemoglobin A1C: 10.3 % — AB (ref 4.0–5.6)

## 2019-03-24 MED ORDER — AZELASTINE HCL 0.1 % NA SOLN: 2.0000 | Freq: Two times a day (BID) | NASAL | 12 refills | Status: DC

## 2019-03-24 MED ORDER — GLIMEPIRIDE 2 MG PO TABS: 4.0000 mg | ORAL_TABLET | Freq: Two times a day (BID) | ORAL | 3 refills | Status: DC

## 2019-03-24 NOTE — Patient Instructions (Signed)
It was very nice to see you today!  Your A1c is elevated today.  Please work on reducing your carbs.  Please come back in 3 months to recheck your A1c.  Please try using MiraLAX for constipation.  Please try using the Astelin for your nasal congestion/swelling.  Take care, Dr [name]  Please try these tips to maintain a healthy lifestyle:   Eat at least 3 REAL meals and 1-2 snacks per day.  Aim for no more than 5 hours between eating.  If you eat breakfast, please do so within one hour of getting up.    Each meal should contain half fruits/vegetables, one quarter protein, and one quarter carbs (no bigger than a computer mouse)   Cut down on sweet beverages. This includes juice, soda, and sweet tea.     Drink at least 1 glass of water with each meal and aim for at least 8 glasses per day   Exercise at least 150 minutes every week.

## 2019-03-24 NOTE — Progress Notes (Signed)
   Brenda Griffin is a 74 y.o. female who presents today for an office visit.  Assessment/Plan:  Allergic Rhinitis  No red flags.  Start Astelin nasal spray.  Constipation No red flags.  Recommended MiraLAX as needed.  Chronic conditions addressed today:   #Essential hypertension - On losartan-HCTZ 100-12.5 once daily and tolerating well - ROS: No reported chest pain or shortness of breath - A/P: Stable. Continue current treatment plan.   #Type 2 diabetes - On metformin 500 mg 3 times daily, Amaryl 4 mg twice daily, and Trulicity 1.5 mg weekly.  Tolerating all well without side effects - ROS: No reported polyuria or polydipsia - A/P: Uncontrolled. A1c 10.3.  Patient not able to afford additional medication at this point.  She will work on diet and exercise and we will recheck in 3 months.  Would consider increasing dose of Trulicity to 3 mg weekly if not at goal.     Subjective:  HPI:  Patient seen a couple months ago.  She has stopped Metformin as her extended release formulation was recalled and she was not able to tolerate the immediate release due to diarrhea.  She has now been having some constipation.  To use Senokot which has not helped significantly.  She has also had some rhinorrhea and nasal swelling.  Has tried fexofenadine in the past which is helped.  Has not been able to tolerate metformin.        Objective:  Physical Exam: BP 122/80   Pulse 89   Temp 97.7 F (36.5 C) (Temporal)   Ht 5\' 6"  (1.676 m)   Wt 217 lb (98.4 kg)   SpO2 98%   BMI 35.02 kg/m   Gen: No acute distress, resting comfortably HEENT: Bilateral TMs with clear effusion.  Nose mucosa erythematous and boggy with clear discharge. CV: Regular rate and rhythm with no murmurs appreciated Pulm: Normal work of breathing, clear to auscultation bilaterally with no crackles, wheezes, or rhonchi Neuro: Grossly normal, moves all extremities Psych: Normal affect and thought content      Brenda Griffin M.  [name], MD 03/24/2019 1:59 PM

## 2019-03-26 ENCOUNTER — Encounter: Payer: Self-pay | Admitting: Family Medicine

## 2019-03-27 ENCOUNTER — Other Ambulatory Visit: Payer: Self-pay

## 2019-03-27 ENCOUNTER — Encounter: Payer: Self-pay | Admitting: Family Medicine

## 2019-03-27 MED ORDER — GLIMEPIRIDE 2 MG PO TABS: 4.0000 mg | ORAL_TABLET | Freq: Two times a day (BID) | ORAL | 2 refills | Status: DC

## 2019-03-27 NOTE — Telephone Encounter (Signed)
Notified patient that correct Rx sent in

## 2019-03-27 NOTE — Telephone Encounter (Signed)
Pt called following up on her message she sent to Dr. [name] about her medication. Please advise.

## 2019-04-08 ENCOUNTER — Encounter: Payer: Self-pay | Admitting: Family Medicine

## 2019-04-08 ENCOUNTER — Other Ambulatory Visit: Payer: Self-pay

## 2019-04-08 MED ORDER — TRULICITY 3 MG/0.5ML ~~LOC~~ SOAJ: 3.0000 mg | SUBCUTANEOUS | 3 refills | Status: DC

## 2019-04-17 ENCOUNTER — Encounter: Payer: Self-pay | Admitting: Obstetrics and Gynecology

## 2019-04-17 DIAGNOSIS — Z1231 Encounter for screening mammogram for malignant neoplasm of breast: Secondary | ICD-10-CM | POA: Diagnosis not present

## 2019-04-19 ENCOUNTER — Encounter: Payer: Self-pay | Admitting: Family Medicine

## 2019-04-21 ENCOUNTER — Other Ambulatory Visit: Payer: Self-pay

## 2019-04-21 DIAGNOSIS — E039 Hypothyroidism, unspecified: Secondary | ICD-10-CM

## 2019-04-21 MED ORDER — LEVOTHYROXINE SODIUM 112 MCG PO TABS: 112.0000 ug | ORAL_TABLET | Freq: Every day | ORAL | 2 refills | Status: DC

## 2019-05-02 ENCOUNTER — Other Ambulatory Visit: Payer: Self-pay | Admitting: Family Medicine

## 2019-05-02 ENCOUNTER — Encounter: Payer: Self-pay | Admitting: Family Medicine

## 2019-05-02 DIAGNOSIS — F419 Anxiety disorder, unspecified: Secondary | ICD-10-CM

## 2019-05-02 MED ORDER — ALPRAZOLAM 0.5 MG PO TABS: 0.5000 mg | ORAL_TABLET | Freq: Every evening | ORAL | 2 refills | Status: AC | PRN

## 2019-05-02 NOTE — Telephone Encounter (Signed)
Rx sent in.  [name]. [name], MD 05/02/2019 3:57 PM

## 2019-05-04 ENCOUNTER — Other Ambulatory Visit: Payer: Self-pay | Admitting: Family Medicine

## 2019-05-04 DIAGNOSIS — E1159 Type 2 diabetes mellitus with other circulatory complications: Secondary | ICD-10-CM

## 2019-05-06 ENCOUNTER — Encounter: Payer: Self-pay | Admitting: Family Medicine

## 2019-05-06 ENCOUNTER — Other Ambulatory Visit: Payer: Self-pay

## 2019-05-06 DIAGNOSIS — E1159 Type 2 diabetes mellitus with other circulatory complications: Secondary | ICD-10-CM

## 2019-05-06 MED ORDER — LOSARTAN POTASSIUM-HCTZ 100-12.5 MG PO TABS: 1.0000 | ORAL_TABLET | Freq: Every day | ORAL | 1 refills | Status: DC

## 2019-06-23 ENCOUNTER — Other Ambulatory Visit: Payer: Self-pay

## 2019-06-23 ENCOUNTER — Ambulatory Visit (INDEPENDENT_AMBULATORY_CARE_PROVIDER_SITE_OTHER): Payer: Medicare Other | Admitting: Family Medicine

## 2019-06-23 VITALS — BP 120/80 | HR 84 | Temp 97.2°F | Ht 66.0 in | Wt 213.0 lb

## 2019-06-23 DIAGNOSIS — E1169 Type 2 diabetes mellitus with other specified complication: Secondary | ICD-10-CM | POA: Diagnosis not present

## 2019-06-23 DIAGNOSIS — E1159 Type 2 diabetes mellitus with other circulatory complications: Secondary | ICD-10-CM | POA: Diagnosis not present

## 2019-06-23 DIAGNOSIS — I10 Essential (primary) hypertension: Secondary | ICD-10-CM

## 2019-06-23 DIAGNOSIS — I152 Hypertension secondary to endocrine disorders: Secondary | ICD-10-CM

## 2019-06-23 DIAGNOSIS — G4762 Sleep related leg cramps: Secondary | ICD-10-CM | POA: Diagnosis not present

## 2019-06-23 DIAGNOSIS — J310 Chronic rhinitis: Secondary | ICD-10-CM | POA: Diagnosis not present

## 2019-06-23 LAB — POCT GLYCOSYLATED HEMOGLOBIN (HGB A1C): Hemoglobin A1C: 10.4 % — AB (ref 4.0–5.6)

## 2019-06-23 MED ORDER — TRULICITY 4.5 MG/0.5ML ~~LOC~~ SOAJ: 4.5000 mg | SUBCUTANEOUS | 5 refills | Status: DC

## 2019-06-23 MED ORDER — MONTELUKAST SODIUM 10 MG PO TABS: 10.0000 mg | ORAL_TABLET | Freq: Every day | ORAL | 3 refills | Status: DC

## 2019-06-23 NOTE — Progress Notes (Signed)
   Brenda Griffin is a 74 y.o. female who presents today for an office visit.  Assessment/Plan:  New/Acute Problems: Nocturnal leg cramps No red flags.  Normal exam today.  Discussed importance of good oral hydration and regular stretching.  Chronic Problems Addressed Today: Rhinitis Uncontrolled.  Some evidence of eustachian tube dysfunction as well.  Will start Singulair.  Advised against systemic steroids such as prednisone due to uncontrolled A1c.  Also place referral to ENT.  Hypertension associated with diabetes (Brenda Griffin), Rx Hyzaar At goal.  Continue losartan-HCTZ 100-12.5 once daily.  Type 2 diabetes mellitus with other specified complication (Brenda Griffin), 123456 uncontrolled at 10.4.  Will increase Trulicity to 4.5 mg weekly.  Continue Amaryl 2 mg twice daily.  Follow-up in 3 months to recheck A1c.  Has not tolerated other oral medications or has had cost issues.  May need to start basal insulin depending on result of next A1c.     Subjective:  HPI:  Patient here for follow-up.  She still having ongoing issues with chronic rhinitis and imbalance.  She has tried using Astelin but not tolerate due to nausea.  She has been compliant with her other medications.  For blood sugar she is currently taking Trulicity 3 mg weekly and glimepiride 4 mg total daily.   She is also been getting nocturnal leg cramps for the past several months.  Soon to be stable.  Usually located in bilateral big toe.  Usually resolves after several minutes.       Objective:  Physical Exam: BP 120/80 (BP Location: Right Arm, Patient Position: Sitting, Cuff Size: Large)   Pulse 84   Temp (!) 97.2 F (36.2 C) (Temporal)   Ht 5\' 6"  (1.676 m)   Wt 213 lb (96.6 kg)   SpO2 96%   BMI 34.38 kg/m   Gen: No acute distress, resting comfortably HEENT: TMs with clear effusion bilaterally.  Nasal mucosa erythematous and boggy with clear discharge. CV: Regular rate and rhythm with no murmurs appreciated Pulm: Normal work of  breathing, clear to auscultation bilaterally with no crackles, wheezes, or rhonchi MSK: Bilateral lower extremities with normal pulses.  Neurovascular intact distally. Neuro: Grossly normal, moves all extremities Psych: Normal affect and thought content      Brenda Griffin M. [name], MD 06/23/2019 2:01 PM

## 2019-06-23 NOTE — Patient Instructions (Signed)
It was very nice to see you today!  Your blood sugar still elevated.  We will increase her Trulicity to 4.5 mg weekly.  Please continue working on diet and exercise.  Please try the Singulair for your allergies.  Also place a referral for you to see the ENT doctor.  Come back in 3 to 6 months, or sooner if needed.  Take care, Dr [name]  Please try these tips to maintain a healthy lifestyle:   Eat at least 3 REAL meals and 1-2 snacks per day.  Aim for no more than 5 hours between eating.  If you eat breakfast, please do so within one hour of getting up.    Each meal should contain half fruits/vegetables, one quarter protein, and one quarter carbs (no bigger than a computer mouse)   Cut down on sweet beverages. This includes juice, soda, and sweet tea.     Drink at least 1 glass of water with each meal and aim for at least 8 glasses per day   Exercise at least 150 minutes every week.

## 2019-06-23 NOTE — Assessment & Plan Note (Signed)
A1c uncontrolled at 10.4.  Will increase Trulicity to 4.5 mg weekly.  Continue Amaryl 2 mg twice daily.  Follow-up in 3 months to recheck A1c.  Has not tolerated other oral medications or has had cost issues.  May need to start basal insulin depending on result of next A1c.

## 2019-06-23 NOTE — Assessment & Plan Note (Signed)
Uncontrolled.  Some evidence of eustachian tube dysfunction as well.  Will start Singulair.  Advised against systemic steroids such as prednisone due to uncontrolled A1c.  Also place referral to ENT.

## 2019-06-23 NOTE — Assessment & Plan Note (Signed)
At goal.  Continue losartan-HCTZ 100-12.5 once daily.

## 2019-07-29 ENCOUNTER — Encounter (INDEPENDENT_AMBULATORY_CARE_PROVIDER_SITE_OTHER): Payer: Self-pay | Admitting: Otolaryngology

## 2019-07-29 ENCOUNTER — Other Ambulatory Visit: Payer: Self-pay

## 2019-07-29 ENCOUNTER — Ambulatory Visit (INDEPENDENT_AMBULATORY_CARE_PROVIDER_SITE_OTHER): Payer: Medicare Other | Admitting: Otolaryngology

## 2019-07-29 VITALS — Temp 97.0°F

## 2019-07-29 DIAGNOSIS — J310 Chronic rhinitis: Secondary | ICD-10-CM | POA: Diagnosis not present

## 2019-07-29 DIAGNOSIS — R42 Dizziness and giddiness: Secondary | ICD-10-CM | POA: Diagnosis not present

## 2019-07-29 NOTE — Progress Notes (Signed)
HPI: Brenda Griffin is a 74 y.o. female who presents is referred by her PCP for evaluation of nasal and ear complaints.  She states that she has water draining from her ears some times.  She has not noted any hearing problems.  She does complain of intermittent dizziness.  She also complains of intermittent sinus congestion and head pressure.  She was given Singulair by her PCP weeks ago but this did not seem to help much.  She does have history of allergies and takes Wal-Fex a couple times a week when allergies are bad.  This seems to help..  Past Medical History:  Diagnosis Date  . Anxiety   . Basal cell carcinoma   . Cataract   . Diabetes mellitus without complication ([place])   . Fibroid   . Gallstones   . GERD (gastroesophageal reflux disease)   . Heart murmur   . Hypercholesterolemia   . Hypertension   . IBS (irritable bowel syndrome)   . Infertility, female   . Pneumonia   . Skin cancer   . Thyroid disease    Past Surgical History:  Procedure Laterality Date  . ABDOMINAL HYSTERECTOMY  1997   oophorectomy  . BUNIONECTOMY Bilateral 1997, 2001   right 1997, left 2001  . CATARACT EXTRACTION Bilateral 2006  . CERVICAL CONE BIOPSY  1976  . EYE SURGERY    . GALLBLADDER SURGERY    . LUMBAR DISC SURGERY    . MOHS SURGERY  2005/2007  . PILONIDAL CYST EXCISION  1975  . TONSILLECTOMY  1957   Social History   Socioeconomic History  . Marital status: Married    Spouse name: Not on file  . Number of children: 0  . Years of education: Not on file  . Highest education level: Not on file  Occupational History  . Occupation: retired  Tobacco Use  . Smoking status: Former Smoker    Packs/day: 1.00    Years: 11.00    Pack years: 11.00    Types: Cigarettes    Start date: 79    Quit date: 03/20/1981    Years since quitting: 38.3  . Smokeless tobacco: Never Used  Substance and Sexual Activity  . Alcohol use: Yes    Comment: socially  . Drug use: No  . Sexual activity: Yes   Partners: Male    Birth control/protection: Post-menopausal  Other Topics Concern  . Not on file  Social History Narrative  . Not on file   Social Determinants of Health   Financial Resource Strain:   . Difficulty of Paying Living Expenses:   Food Insecurity:   . Worried About Charity fundraiser in the Last Year:   . Arboriculturist in the Last Year:   Transportation Needs:   . Film/video editor (Medical):   Marland Kitchen Lack of Transportation (Non-Medical):   Physical Activity:   . Days of Exercise per Week:   . Minutes of Exercise per Session:   Stress:   . Feeling of Stress :   Social Connections:   . Frequency of Communication with Friends and Family:   . Frequency of Social Gatherings with Friends and Family:   . Attends Religious Services:   . Active Member of Clubs or Organizations:   . Attends Archivist Meetings:   Marland Kitchen Marital Status:    Family History  Problem Relation Age of Onset  . Stroke Mother   . Heart disease Father   . Hyperlipidemia Father   .  Hypertension Father   . Hypertension Sister   . Heart disease Paternal Grandmother   . Hypertension Paternal Grandmother   . Crohn's disease Brother   . Diabetes Brother   . Hypertension Brother   . Diabetes Brother   . Colon cancer Maternal Great-grandmother    Allergies  Allergen Reactions  . Bacitracin Rash  . Benzoic Acid Rash  . Benzyl Salicylate Rash  . Formaldehyde Rash  . Geraniol Rash  . Hydroxyethyl Methacrylate Rash  . Methyl Methacrylate [Methylmethacrylate] Rash  . Other Rash    Amyl Cinnamaldehyde  . Parabens Hives  . Adhesive [Tape] Itching  . Amitiza [Lubiprostone] Hives and Diarrhea  . Aspirin Nausea And Vomiting  . Codeine Nausea And Vomiting  . Victoza [Liraglutide] Diarrhea  . Butanediol [Butylene Glycol] Rash   Prior to Admission medications   Medication Sig Start Date End Date Taking? Authorizing Provider  acetaminophen (TYLENOL) 500 MG tablet Take 500 mg by mouth as  needed.   Yes [provider]  ALPRAZolam Duanne Moron) 0.5 MG tablet Take 1 tablet (0.5 mg total) by mouth at bedtime as needed for anxiety. 05/02/19  Yes Vivi Barrack, MD  Biotin 10000 MCG TABS Take 1 tablet by mouth daily as needed.   Yes [provider]  Dulaglutide (TRULICITY) 4.5 0000000 SOPN Inject 4.5 mg as directed once a week. 06/23/19  Yes Vivi Barrack, MD  estradiol (ESTRACE) 1 MG tablet Take 1 tablet (1 mg total) by mouth daily. 01/13/19  Yes Salvadore Dom, MD  Fexofenadine HCl (WAL-FEX ALLERGY PO) Take by mouth as needed.   Yes [provider]  levothyroxine (SYNTHROID) 112 MCG tablet Take 1 tablet (112 mcg total) by mouth daily before breakfast. 04/21/19  Yes Vivi Barrack, MD  loperamide (IMODIUM A-D) 2 MG tablet Take 2 mg by mouth as needed for diarrhea or loose stools.   Yes [provider]  losartan-hydrochlorothiazide (HYZAAR) 100-12.5 MG tablet Take 1 tablet by mouth daily. 05/06/19  Yes Vivi Barrack, MD  montelukast (SINGULAIR) 10 MG tablet Take 1 tablet (10 mg total) by mouth at bedtime. 06/23/19  Yes Vivi Barrack, MD  pantoprazole (PROTONIX) 40 MG tablet TAKE 1 TABLET(40 MG) BY MOUTH DAILY 03/11/19  Yes Vivi Barrack, MD  rosuvastatin (CRESTOR) 20 MG tablet Take 1 tablet (20 mg total) by mouth daily. 12/31/18  Yes Vivi Barrack, MD  senna (SENOKOT) 8.6 MG tablet Take 1 tablet by mouth as needed for constipation.   Yes [provider]  glimepiride (AMARYL) 2 MG tablet Take 2 tablets (4 mg total) by mouth 2 (two) times daily. 03/27/19 06/25/19  Vivi Barrack, MD     Positive ROS: Otherwise negative  All other systems have been reviewed and were otherwise negative with the exception of those mentioned in the HPI and as above.  Physical Exam: Constitutional: Alert, well-appearing, no acute distress Ears: External ears without lesions or tenderness. Ear canals are clear bilaterally with intact, clear TMs.  On hearing  screening with a 512 1024 tuning fork she heard well in both ears with AC > BC bilaterally.  She had perhaps mild hearing loss with a 1024 tuning fork but hearing was symmetric.  On Dix-Hallpike testing there was no clinical evidence of BPPV. Nasal: External nose without lesions. Septum midline with mild rhinitis.. Clear nasal passages otherwise with no signs of infection.  Both middle meatus regions are clear.  She apparently had a swollen placed on the left side  of her nose earlier but this has resolved.  On external nose there are no skin lesions noted. Oral: Lips and gums without lesions. Tongue and palate mucosa without lesions. Posterior oropharynx clear. Neck: No palpable adenopathy or masses Respiratory: Breathing comfortably  Skin: No facial/neck lesions or rash noted.  Procedures  Assessment: Allergic rhinitis Dizziness without clinical signs of BPPV on evaluation in the office today  Plan: Recommended regular use of Nasacort 2 sprays each nostril at night as this will help with nasal congestion as well as postnasal drainage.  Also discussed use of saline irrigation for postnasal drainage. For the dizziness recommended increased activity and possible vestibular rehabilitation. Would also encourage weight loss.    Radene Journey, MD   CC:

## 2019-08-27 ENCOUNTER — Telehealth: Payer: Self-pay | Admitting: Family Medicine

## 2019-08-27 ENCOUNTER — Encounter (HOSPITAL_BASED_OUTPATIENT_CLINIC_OR_DEPARTMENT_OTHER): Payer: Self-pay

## 2019-08-27 ENCOUNTER — Emergency Department (HOSPITAL_BASED_OUTPATIENT_CLINIC_OR_DEPARTMENT_OTHER)
Admission: EM | Admit: 2019-08-27 | Discharge: 2019-08-27 | Disposition: A | Discharge status: Home / Self Care | Payer: Medicare Other | Attending: Emergency Medicine | Admitting: Emergency Medicine

## 2019-08-27 ENCOUNTER — Emergency Department (HOSPITAL_COMMUNITY): Payer: Medicare Other

## 2019-08-27 ENCOUNTER — Other Ambulatory Visit: Payer: Self-pay

## 2019-08-27 ENCOUNTER — Emergency Department (HOSPITAL_BASED_OUTPATIENT_CLINIC_OR_DEPARTMENT_OTHER): Payer: Medicare Other

## 2019-08-27 DIAGNOSIS — E119 Type 2 diabetes mellitus without complications: Secondary | ICD-10-CM | POA: Insufficient documentation

## 2019-08-27 DIAGNOSIS — Z79899 Other long term (current) drug therapy: Secondary | ICD-10-CM | POA: Insufficient documentation

## 2019-08-27 DIAGNOSIS — E1142 Type 2 diabetes mellitus with diabetic polyneuropathy: Secondary | ICD-10-CM | POA: Insufficient documentation

## 2019-08-27 DIAGNOSIS — I10 Essential (primary) hypertension: Secondary | ICD-10-CM | POA: Diagnosis not present

## 2019-08-27 DIAGNOSIS — R519 Headache, unspecified: Secondary | ICD-10-CM | POA: Diagnosis not present

## 2019-08-27 DIAGNOSIS — R42 Dizziness and giddiness: Secondary | ICD-10-CM | POA: Diagnosis not present

## 2019-08-27 DIAGNOSIS — Z87891 Personal history of nicotine dependence: Secondary | ICD-10-CM | POA: Insufficient documentation

## 2019-08-27 DIAGNOSIS — R531 Weakness: Secondary | ICD-10-CM | POA: Diagnosis not present

## 2019-08-27 DIAGNOSIS — Z85828 Personal history of other malignant neoplasm of skin: Secondary | ICD-10-CM | POA: Diagnosis not present

## 2019-08-27 DIAGNOSIS — H35033 Hypertensive retinopathy, bilateral: Secondary | ICD-10-CM | POA: Diagnosis not present

## 2019-08-27 LAB — CBC WITH DIFFERENTIAL/PLATELET
Abs Immature Granulocytes: 0.03 10*3/uL (ref 0.00–0.07)
Basophils Absolute: 0.1 10*3/uL (ref 0.0–0.1)
Basophils Relative: 1 %
Eosinophils Absolute: 0.2 10*3/uL (ref 0.0–0.5)
Eosinophils Relative: 2 %
HCT: 41.4 % (ref 36.0–46.0)
Hemoglobin: 14.4 g/dL (ref 12.0–15.0)
Immature Granulocytes: 0 %
Lymphocytes Relative: 25 %
Lymphs Abs: 2.1 10*3/uL (ref 0.7–4.0)
MCH: 29.7 pg (ref 26.0–34.0)
MCHC: 34.8 g/dL (ref 30.0–36.0)
MCV: 85.4 fL (ref 80.0–100.0)
Monocytes Absolute: 0.4 10*3/uL (ref 0.1–1.0)
Monocytes Relative: 5 %
Neutro Abs: 5.6 10*3/uL (ref 1.7–7.7)
Neutrophils Relative %: 67 %
Platelets: 292 10*3/uL (ref 150–400)
RBC: 4.85 MIL/uL (ref 3.87–5.11)
RDW: 12.8 % (ref 11.5–15.5)
WBC: 8.3 10*3/uL (ref 4.0–10.5)
nRBC: 0 % (ref 0.0–0.2)

## 2019-08-27 LAB — BASIC METABOLIC PANEL
Anion gap: 10 (ref 5–15)
BUN: 17 mg/dL (ref 8–23)
CO2: 25 mmol/L (ref 22–32)
Calcium: 9.1 mg/dL (ref 8.9–10.3)
Chloride: 96 mmol/L — ABNORMAL LOW (ref 98–111)
Creatinine, Ser: 0.92 mg/dL (ref 0.44–1.00)
GFR calc Af Amer: >60 mL/min (ref >60)
GFR calc non Af Amer: >60 mL/min (ref >60)
Glucose, Bld: 393 mg/dL — ABNORMAL HIGH (ref 70–99)
Potassium: 4 mmol/L (ref 3.5–5.1)
Sodium: 131 mmol/L — ABNORMAL LOW (ref 135–145)

## 2019-08-27 LAB — PROTIME-INR
INR: 1 (ref 0.8–1.2)
Prothrombin Time: 12.7 seconds (ref 11.4–15.2)

## 2019-08-27 MED ORDER — MECLIZINE HCL 25 MG PO TABS
25.0000 mg | ORAL_TABLET | Freq: Once | ORAL | Status: AC
  Administered 2019-08-27: 25 mg via ORAL
  Filled 2019-08-27: qty 1

## 2019-08-27 MED ORDER — SODIUM CHLORIDE 0.9 % IV BOLUS
1000.0000 mL | Freq: Once | INTRAVENOUS | Status: AC
  Administered 2019-08-27: 1000 mL via INTRAVENOUS

## 2019-08-27 NOTE — ED Provider Notes (Signed)
[facility] EMERGENCY DEPARTMENT Provider Note   CSN: [id number] Arrival date & time: 08/27/19  1347     History Chief Complaint  Patient presents with  . Dizziness    Brenda Griffin is a 73 y.o. female.  Patient is a 75 year old female with past medical history of diabetes, hypertension, irritable bowel.  She presents today for evaluation of dizziness.  Patient states that yesterday at approximately 7:45 PM after eating dinner, she had the acute onset of dizziness.  She reports feeling off balance with associated nausea and vomiting, weakness, and visual disturbances.  She tells me her vision changed and was seeing objects at a 45 degree angle.  This lasted for several hours, then she went to sleep.  She woke up this morning with persistent symptoms, however to a much less degree.  She reports mild headache.  She denies fevers or chills.  She denies stiff neck.  The history is provided by the patient.  Dizziness Quality:  Imbalance Severity:  Moderate Onset quality:  Sudden Timing:  Constant Progression:  Improving Chronicity:  New Context: head movement   Relieved by:  Nothing Worsened by:  Standing up, turning head and movement      Past Medical History:  Diagnosis Date  . Anxiety   . Basal cell carcinoma   . Cataract   . Diabetes mellitus without complication ([place])   . Fibroid   . Gallstones   . GERD (gastroesophageal reflux disease)   . Heart murmur   . Hypercholesterolemia   . Hypertension   . IBS (irritable bowel syndrome)   . Infertility, female   . Pneumonia   . Skin cancer   . Thyroid disease     Patient Active Problem List   Diagnosis Date Noted  . Rhinitis 06/23/2019  . Hypertensive retinopathy of both eyes 07/16/2018  . GERD (gastroesophageal reflux disease) 04/16/2018  . Diabetic polyneuropathy associated with type 2 diabetes mellitus ([place]) 04/15/2017  . Hormone replacement therapy, followed by GYN 10/11/2016  . Cluster headaches,  rarely, prn butalbital 10/11/2016  . Type 2 diabetes mellitus with other specified complication ([place]), 44/03/270  . Hypothyroidism, on Levothyroxine 10/11/2016  . Hypertension associated with diabetes ([place]), Rx Hyzaar 10/11/2016  . Hyperlipidemia associated with type 2 diabetes mellitus ([place]), Rx Crestor 10/11/2016  . Anxiety, Rx Xanax, uses sparingly 10/11/2016  . Morbid obesity ([place]) 10/11/2016    Past Surgical History:  Procedure Laterality Date  . ABDOMINAL HYSTERECTOMY  1997   oophorectomy  . BUNIONECTOMY Bilateral 1997, 2001   right 1997, left 2001  . CATARACT EXTRACTION Bilateral 2006  . CERVICAL CONE BIOPSY  1976  . EYE SURGERY    . GALLBLADDER SURGERY    . LUMBAR DISC SURGERY    . MOHS SURGERY  2005/2007  . PILONIDAL CYST EXCISION  1975  . TONSILLECTOMY  1957     OB History    Gravida  0   Para  0   Term  0   Preterm  0   AB  0   Living  0     SAB  0   TAB  0   Ectopic  0   Multiple  0   Live Births  0           Family History  Problem Relation Age of Onset  . Stroke Mother   . Heart disease Father   . Hyperlipidemia Father   . Hypertension Father   . Hypertension Sister   .  Heart disease Paternal Grandmother   . Hypertension Paternal Grandmother   . Crohn's disease Brother   . Diabetes Brother   . Hypertension Brother   . Diabetes Brother   . Colon cancer Maternal Great-grandmother     Social History   Tobacco Use  . Smoking status: Former Smoker    Packs/day: 1.00    Years: 11.00    Pack years: 11.00    Types: Cigarettes    Start date: 19    Quit date: 03/20/1981    Years since quitting: 38.4  . Smokeless tobacco: Never Used  Substance Use Topics  . Alcohol use: Yes    Comment: socially  . Drug use: No    Home Medications Prior to Admission medications   Medication Sig Start Date End Date Taking? Authorizing Provider  acetaminophen (TYLENOL) 500 MG tablet Take 500 mg by mouth as needed.    [provider]   ALPRAZolam Duanne Moron) 0.5 MG tablet Take 1 tablet (0.5 mg total) by mouth at bedtime as needed for anxiety. 05/02/19   Vivi Barrack, MD  Biotin 10000 MCG TABS Take 1 tablet by mouth daily as needed.    [provider]  Dulaglutide (TRULICITY) 4.5 ON/6.2XB SOPN Inject 4.5 mg as directed once a week. 06/23/19   Vivi Barrack, MD  estradiol (ESTRACE) 1 MG tablet Take 1 tablet (1 mg total) by mouth daily. 01/13/19   Salvadore Dom, MD  Fexofenadine HCl (WAL-FEX ALLERGY PO) Take by mouth as needed.    [provider]  glimepiride (AMARYL) 2 MG tablet Take 2 tablets (4 mg total) by mouth 2 (two) times daily. 03/27/19 06/25/19  Vivi Barrack, MD  levothyroxine (SYNTHROID) 112 MCG tablet Take 1 tablet (112 mcg total) by mouth daily before breakfast. 04/21/19   Vivi Barrack, MD  loperamide (IMODIUM A-D) 2 MG tablet Take 2 mg by mouth as needed for diarrhea or loose stools.    [provider]  losartan-hydrochlorothiazide (HYZAAR) 100-12.5 MG tablet Take 1 tablet by mouth daily. 05/06/19   Vivi Barrack, MD  montelukast (SINGULAIR) 10 MG tablet Take 1 tablet (10 mg total) by mouth at bedtime. 06/23/19   Vivi Barrack, MD  pantoprazole (PROTONIX) 40 MG tablet TAKE 1 TABLET(40 MG) BY MOUTH DAILY 03/11/19   Vivi Barrack, MD  rosuvastatin (CRESTOR) 20 MG tablet Take 1 tablet (20 mg total) by mouth daily. 12/31/18   Vivi Barrack, MD  senna (SENOKOT) 8.6 MG tablet Take 1 tablet by mouth as needed for constipation.    [provider]    Allergies    Bacitracin, Benzoic acid, Benzyl salicylate, Formaldehyde, Geraniol, Hydroxyethyl methacrylate, Methyl methacrylate [methylmethacrylate], Other, Parabens, Adhesive [tape], Amitiza [lubiprostone], Aspirin, Codeine, Victoza [liraglutide], and Butanediol [butylene glycol]  Review of Systems   Review of Systems  Neurological: Positive for dizziness.  All other systems reviewed and are negative.   Physical Exam Updated  Vital Signs BP 136/75 (BP Location: Right Arm)   Pulse 89   Temp 99.5 F (37.5 C) (Oral)   Resp 18   Wt 99.5 kg   SpO2 97%   BMI 35.41 kg/m   Physical Exam Vitals and nursing note reviewed.  Constitutional:      General: She is not in acute distress.    Appearance: She is well-developed. She is not diaphoretic.  HENT:     Head: Normocephalic and atraumatic.  Eyes:     Extraocular Movements: Extraocular movements intact.  Pupils: Pupils are equal, round, and reactive to light.  Cardiovascular:     Rate and Rhythm: Normal rate and regular rhythm.     Heart sounds: No murmur. No friction rub. No gallop.   Pulmonary:     Effort: Pulmonary effort is normal. No respiratory distress.     Breath sounds: Normal breath sounds. No wheezing.  Abdominal:     General: Bowel sounds are normal. There is no distension.     Palpations: Abdomen is soft.     Tenderness: There is no abdominal tenderness.  Musculoskeletal:        General: Normal range of motion.     Cervical back: Normal range of motion and neck supple.  Skin:    General: Skin is warm and dry.  Neurological:     General: No focal deficit present.     Mental Status: She is alert and oriented to person, place, and time.     Cranial Nerves: No cranial nerve deficit.     Motor: No weakness.     Coordination: Coordination normal.     ED Results / Procedures / Treatments   Labs (all labs ordered are listed, but only abnormal results are displayed) Labs Reviewed - No data to display  EKG None  Radiology No results found.  Procedures Procedures (including critical care time)  Medications Ordered in ED Medications  sodium chloride 0.9 % bolus 1,000 mL (has no administration in time range)  meclizine (ANTIVERT) tablet 25 mg (has no administration in time range)    ED Course  I have reviewed the triage vital signs and the nursing notes.  Pertinent labs & imaging results that were available during my care of the  patient were reviewed by me and considered in my medical decision making (see chart for details).    MDM Rules/Calculators/A&P  Patient presenting here with complaints of dizziness and visual disturbances as described in the HPI.  She is describing vertigo, however this has not resolved with fluids and meclizine.  My concern is for a possible posterior circulation stroke.  I will transfer patient to [facility] for MRI.  I spoken with Dr. Meliton Rattan who agrees to accept in transport.  Patient's work-up here shows negative head CT and unremarkable laboratory studies.  Final Clinical Impression(s) / ED Diagnoses Final diagnoses:  None    Rx / DC Orders ED Discharge Orders    None       Veryl Speak, MD 08/27/19 1545

## 2019-08-27 NOTE — Telephone Encounter (Signed)
Notified patient husband to take her to [facility]he voices understanding. FYI

## 2019-08-27 NOTE — ED Notes (Signed)
Pt transported to CT ?

## 2019-08-27 NOTE — Telephone Encounter (Signed)
Nurse Assessment Nurse: Juleen China, RN, Butch Penny Date/Time Eilene Ghazi Time): 08/27/2019 12:48:16 PM Confirm and document reason for call. If symptomatic, describe symptoms. ---Caller states she was doing dishes, lost balance, dizziness, everything went 45 degrees angle, vomited X 2 and legs gave out but husband did not let her fall. Lasted about an hour before she could walk. No fever, cough or SOB. Has the patient had close contact with a person known or suspected to have the novel coronavirus illness OR traveled / lives in area with major community spread (including international travel) in the last 14 days from the onset of symptoms? * If Asymptomatic, screen for exposure and travel within the last 14 days. ---No Does the patient have any new or worsening symptoms? ---Yes Will a triage be completed? ---Yes Related visit to physician within the last 2 weeks? ---No Does the PT have any chronic conditions? (i.e. diabetes, asthma, this includes High risk factors for pregnancy, etc.) ---Yes List chronic conditions. ---HTN Diabetes High Cholesterol Is this a behavioral health or substance abuse call? ---No Guidelines Guideline Title Affirmed Question Affirmed Notes Nurse Date/Time (Eastern Time) Dizziness - Lightheadedness [1] MODERATE dizziness (e.g., interferes with normal activities) Juleen China, RN, Butch Penny 08/27/2019 12:52:09 PMPLEASE NOTE: All timestamps contained within this report are represented as [place] Standard Time. CONFIDENTIALTY NOTICE: This fax transmission is intended only for the addressee. It contains information that is legally privileged, confidential or otherwise protected from use or disclosure. If you are not the intended recipient, you are strictly prohibited from reviewing, disclosing, copying using or disseminating any of this information or taking any action in reliance on or regarding this information. If you have received this fax in error, please notify us immediately by  telephone so that we can arrange for its return to [place]. Phone: [phone], Toll-Free: [phone], Fax: [phone] Page: 2 of 2 Call Id: [id number] Guidelines Guideline Title Affirmed Question Affirmed Notes Nurse Date/Time Eilene Ghazi Time) AND [2] has NOT been evaluated by physician for this (Exception: dizziness caused by heat exposure, sudden standing, or poor fluid intake) Disp. Time Eilene Ghazi Time) Disposition Final User 08/27/2019 12:55:50 PM See PCP within 24 Hours Yes Juleen China, RN, [facility] Disagree/Comply Comply Caller Understands Yes PreDisposition Call Doctor Care Advice Given Per Guideline SEE PCP WITHIN 24 HOURS: * IF OFFICE WILL BE OPEN: You need to be examined within the next 24 hours. Call your doctor (or NP/PA) when the office opens and make an appointment. DRINK FLUIDS: * Drink several glasses of fruit juice, other clear fluids or water. * This will improve hydration and blood glucose. * If the weather is hot or you have a fever, make sure the fluids are cold. LIE DOWN AND REST: * Lie down with feet elevated for 1 hour. * This will improve circulation and increase blood flow to the brain. CALL BACK IF: * Passes out (faints) * You become worse. CARE ADVICE given per Dizziness (Adult) guideline. Comments User: Jennye Moccasin, RN Date/Time Eilene Ghazi Time): 08/27/2019 12:59:28 PM Office is currently closed for lunch. Caller will contact office. User: Jennye Moccasin, RN Date/Time (Eastern Time): 08/27/2019 1:00:33 PM UPGRADED to be seen in 4 hours. Referrals REFERRED TO PCP OFFICE

## 2019-08-27 NOTE — Discharge Instructions (Addendum)
The imaging of your brain today does not show any evidence of a stroke.  Your blood sugar was high.  We have not identified an exact reason for your symptoms.  As we discussed, we could do further evaluation with a CT to look at the blood vessels in your neck and your brain tonight.  You would like to follow-up with your primary care doctor.  I encourage you to do this in the next 2 days for recheck.  They may order additional tests to evaluate your risk for stroke.  You need to return to the emergency department if you have weakness in your arms or legs, slurred speech, trouble walking or talking, confusion, or trouble with your balance.

## 2019-08-27 NOTE — ED Triage Notes (Addendum)
Pt states she had sudden onset episode of dizziness, n/v, weakness and vision change ~745pm yesterday-states she was advised by PCP triage RN to come to ED for possible stroke-reports "slight" HA-NAD-to triage in w/c

## 2019-08-27 NOTE — ED Notes (Signed)
ED Provider at bedside-RN notified that provider will send pt to [facility] for MRI

## 2019-08-27 NOTE — ED Provider Notes (Signed)
7:19 PM Patient arrived from [facility] for MRI imaging of the brain to rule out stroke.  Patient symptoms are much improved.  On reexam, she has a normal neurologic exam.  She has a few beats of horizontal nystagmus with the fast phase to the left.  Her symptoms are much improved per her report.  MRI was negative for stroke.  Discussed findings with Dr. Johnney Killian.  I reviewed the patient's history with Dr. Leonel Ramsay of neurology.  He recommends CT angiography of the head and neck .  If this were negative, patient may be discharged home.  I discussed MRI findings with patient and husband at bedside.  I relayed my conversation with neurologist regarding additional potential work-up which could be performed at this time.  Patient does not wish to have any other testing tonight.  She is feeling better and would like to be discharged home.  She is willing to follow-up with her primary care doctor and return if anything changes or worsen.  We did discuss that if her symptoms recur, she needs to return the emergency department as soon as possible as TPA can only be given within a time window.  She understands this.  Patient counseled to return if they have weakness in their arms or legs, slurred speech, trouble walking or talking, confusion, trouble with their balance, or if they have any other concerns. Patient verbalizes understanding and agrees with plan.   Prior to discharge, patient ambulated by RN without any difficulties.  She is ready to go.  BP (!) 168/77 (BP Location: Left Arm)   Pulse 94   Temp 98.8 F (37.1 C) (Oral)   Resp 18   Wt 99.5 kg   SpO2 98%   BMI 35.41 kg/m      Carlisle Cater, PA-C 08/27/19 Kathyrn Drown    Charlesetta Shanks, MD 08/27/19 2340

## 2019-08-27 NOTE — ED Notes (Signed)
PT ambulated without difficulty.  Stated some dizziness, but was stable.  She states she feels stable enough to go home.

## 2019-09-02 ENCOUNTER — Ambulatory Visit (INDEPENDENT_AMBULATORY_CARE_PROVIDER_SITE_OTHER): Payer: Medicare Other | Admitting: Family Medicine

## 2019-09-02 ENCOUNTER — Encounter: Payer: Self-pay | Admitting: Family Medicine

## 2019-09-02 ENCOUNTER — Other Ambulatory Visit: Payer: Self-pay

## 2019-09-02 VITALS — BP 124/66 | HR 85 | Temp 97.0°F | Ht 66.0 in | Wt 211.2 lb

## 2019-09-02 DIAGNOSIS — R42 Dizziness and giddiness: Secondary | ICD-10-CM | POA: Insufficient documentation

## 2019-09-02 MED FILL — Ondansetron HCl Inj 4 MG/2ML (2 MG/ML): INTRAMUSCULAR | Qty: 2 | Status: AC

## 2019-09-02 NOTE — Progress Notes (Signed)
   Brenda Griffin is a 74 y.o. female who presents today for an office visit.  Assessment/Plan:  New/Acute Problems: Vertigo No recurrence of severe symptoms.  Possibly could BPPV however will complete work-up as recommended by neurology in the ED.  Will check a CT angiogram of head and neck.  If normal would consider referral to vestibular rehab.  Also discussed possibility that her symptoms could represent TIA and stressed importance of good glycemic control, cholesterol control, and blood pressure control.    Subjective:  HPI:  Patient presented to the Ed 6 days ago with sudden onset dizziness.  She was given fluids and meclizine with no improvement.  She was transferred to [facility] for MRI.  MRI was negative for acute stroke.  Neurology was consulted.  Recommended CT angiogram of head and neck.  She has had occasional dizziness since being discharged.  No severe symptoms as previously.  No difficulty speaking or swallowing.  No reported chest pain or shortness of breath.  No reported weakness or numbness.       Objective:  Physical Exam: BP 124/66   Pulse 85   Temp (!) 97 F (36.1 C)   Ht 5\' 6"  (1.676 m)   Wt 211 lb 3.2 oz (95.8 kg)   SpO2 97%   BMI 34.09 kg/m   Gen: No acute distress, resting comfortably Neuro: Grossly normal, moves all extremities Psych: Normal affect and thought content  Time Spent: 35 minutes of total time was spent on the date of the encounter performing the following actions: chart review prior to seeing the patient including reviewing recommendations from neurologist at her ED visit, obtaining history, performing a medically necessary exam, counseling on the treatment plan, placing orders, and documenting in our EHR.         [name]. [name], MD 09/02/2019 11:26 AM

## 2019-09-02 NOTE — Patient Instructions (Addendum)
It was very nice to see you today!  We will refer you to have a CT scan as the neurologist recommended.  Please keep up the good work with staying active.  I will see you back in October for your regular checkup.  Take care, Dr [name]  Please try these tips to maintain a healthy lifestyle:   Eat at least 3 REAL meals and 1-2 snacks per day.  Aim for no more than 5 hours between eating.  If you eat breakfast, please do so within one hour of getting up.    Each meal should contain half fruits/vegetables, one quarter protein, and one quarter carbs (no bigger than a computer mouse)   Cut down on sweet beverages. This includes juice, soda, and sweet tea.     Drink at least 1 glass of water with each meal and aim for at least 8 glasses per day   Exercise at least 150 minutes every week.

## 2019-09-02 NOTE — Assessment & Plan Note (Signed)
No recurrence of severe symptoms.  Possibly could BPPV however will complete work-up as recommended by neurology in the ED.  Will check a CT angiogram of head and neck.  If normal would consider referral to vestibular rehab.  Also discussed possibility that her symptoms could represent TIA and stressed importance of good glycemic control, cholesterol control, and blood pressure control.

## 2019-09-03 ENCOUNTER — Ambulatory Visit (HOSPITAL_BASED_OUTPATIENT_CLINIC_OR_DEPARTMENT_OTHER)
Admission: RE | Admit: 2019-09-03 | Discharge: 2019-09-03 | Disposition: A | Discharge status: Home / Self Care | Payer: Medicare Other | Source: Ambulatory Visit | Attending: Family Medicine | Admitting: Family Medicine

## 2019-09-03 DIAGNOSIS — R42 Dizziness and giddiness: Secondary | ICD-10-CM | POA: Insufficient documentation

## 2019-09-03 DIAGNOSIS — I6521 Occlusion and stenosis of right carotid artery: Secondary | ICD-10-CM | POA: Diagnosis not present

## 2019-09-03 MED ORDER — IOHEXOL 350 MG/ML SOLN
100.0000 mL | Freq: Once | INTRAVENOUS | Status: AC | PRN
  Administered 2019-09-03: 100 mL via INTRAVENOUS

## 2019-09-04 NOTE — Progress Notes (Signed)
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 This could explain some of her symptoms. Recommend referral to vascular surgery for further evaluation.  Please place referral.   Deontay Ladnier M. [name], MD 09/04/2019 7:36 PM

## 2019-09-05 ENCOUNTER — Other Ambulatory Visit: Payer: Self-pay

## 2019-09-05 DIAGNOSIS — I6529 Occlusion and stenosis of unspecified carotid artery: Secondary | ICD-10-CM

## 2019-09-16 ENCOUNTER — Encounter: Payer: Self-pay | Admitting: Family Medicine

## 2019-09-17 NOTE — Telephone Encounter (Signed)
Please place referral and I will send to [facility].

## 2019-09-17 NOTE — Addendum Note (Signed)
Addended by: Loralyn Freshwater on: 09/17/2019 01:32 PM   Modules accepted: Orders

## 2019-09-18 ENCOUNTER — Telehealth: Payer: Self-pay | Admitting: Family Medicine

## 2019-09-18 NOTE — Telephone Encounter (Signed)
Brenda Griffin from [facility] called to let us know that due to the diagnosis on the referral they are unable to see the patient, as they do not treat stenosis of carotid artery, unspecified laterality

## 2019-09-19 NOTE — Telephone Encounter (Signed)
Sent referral to new office.

## 2019-09-24 ENCOUNTER — Encounter: Payer: Self-pay | Admitting: Family Medicine

## 2019-10-14 DIAGNOSIS — I6523 Occlusion and stenosis of bilateral carotid arteries: Secondary | ICD-10-CM | POA: Diagnosis not present

## 2019-10-14 DIAGNOSIS — I6521 Occlusion and stenosis of right carotid artery: Secondary | ICD-10-CM | POA: Diagnosis not present

## 2019-10-16 ENCOUNTER — Telehealth: Payer: Self-pay | Admitting: Family Medicine

## 2019-10-16 NOTE — Telephone Encounter (Signed)
Pt would like to transfer form Dr [name] to Dr Bryan Lemma because she said [facility] is a lot closer for her than [facility]. Is this ok?

## 2019-10-16 NOTE — Telephone Encounter (Signed)
Ok with me 

## 2019-10-16 NOTE — Telephone Encounter (Signed)
Please see message. °

## 2019-10-16 NOTE — Telephone Encounter (Signed)
Brenda Griffin with me.  [name]. [name], MD 10/16/2019 1:32 PM

## 2019-10-17 ENCOUNTER — Other Ambulatory Visit: Payer: Self-pay | Admitting: Family Medicine

## 2019-10-29 ENCOUNTER — Other Ambulatory Visit: Payer: Self-pay | Admitting: Family Medicine

## 2019-10-29 DIAGNOSIS — K219 Gastro-esophageal reflux disease without esophagitis: Secondary | ICD-10-CM

## 2019-10-31 ENCOUNTER — Other Ambulatory Visit: Payer: Self-pay | Admitting: Family Medicine

## 2019-10-31 DIAGNOSIS — E1159 Type 2 diabetes mellitus with other circulatory complications: Secondary | ICD-10-CM

## 2019-11-26 DIAGNOSIS — E119 Type 2 diabetes mellitus without complications: Secondary | ICD-10-CM | POA: Diagnosis not present

## 2019-11-26 DIAGNOSIS — Z7984 Long term (current) use of oral hypoglycemic drugs: Secondary | ICD-10-CM | POA: Diagnosis not present

## 2019-11-26 DIAGNOSIS — H52203 Unspecified astigmatism, bilateral: Secondary | ICD-10-CM | POA: Diagnosis not present

## 2019-11-26 DIAGNOSIS — H43812 Vitreous degeneration, left eye: Secondary | ICD-10-CM | POA: Insufficient documentation

## 2019-11-26 DIAGNOSIS — H26493 Other secondary cataract, bilateral: Secondary | ICD-10-CM | POA: Diagnosis not present

## 2019-11-26 DIAGNOSIS — H524 Presbyopia: Secondary | ICD-10-CM | POA: Diagnosis not present

## 2019-11-30 ENCOUNTER — Encounter: Payer: Self-pay | Admitting: Family Medicine

## 2019-12-02 ENCOUNTER — Other Ambulatory Visit: Payer: Self-pay | Admitting: Family Medicine

## 2019-12-02 ENCOUNTER — Other Ambulatory Visit: Payer: Self-pay

## 2019-12-02 DIAGNOSIS — E039 Hypothyroidism, unspecified: Secondary | ICD-10-CM

## 2019-12-02 MED ORDER — LEVOTHYROXINE SODIUM 112 MCG PO TABS: 112.0000 ug | ORAL_TABLET | Freq: Every day | ORAL | 0 refills | Status: DC

## 2019-12-23 ENCOUNTER — Ambulatory Visit: Payer: Medicare Other | Admitting: Family Medicine

## 2019-12-23 ENCOUNTER — Other Ambulatory Visit: Payer: Self-pay | Admitting: Family Medicine

## 2019-12-25 ENCOUNTER — Other Ambulatory Visit: Payer: Self-pay

## 2019-12-26 ENCOUNTER — Ambulatory Visit (INDEPENDENT_AMBULATORY_CARE_PROVIDER_SITE_OTHER): Payer: Medicare Other | Admitting: Family Medicine

## 2019-12-26 ENCOUNTER — Encounter: Payer: Self-pay | Admitting: Family Medicine

## 2019-12-26 VITALS — BP 120/72 | HR 106 | Temp 96.5°F | Ht 66.0 in | Wt 209.0 lb

## 2019-12-26 DIAGNOSIS — F419 Anxiety disorder, unspecified: Secondary | ICD-10-CM | POA: Diagnosis not present

## 2019-12-26 DIAGNOSIS — K5909 Other constipation: Secondary | ICD-10-CM | POA: Diagnosis not present

## 2019-12-26 DIAGNOSIS — E039 Hypothyroidism, unspecified: Secondary | ICD-10-CM

## 2019-12-26 DIAGNOSIS — E785 Hyperlipidemia, unspecified: Secondary | ICD-10-CM

## 2019-12-26 DIAGNOSIS — E1169 Type 2 diabetes mellitus with other specified complication: Secondary | ICD-10-CM | POA: Diagnosis not present

## 2019-12-26 DIAGNOSIS — E1159 Type 2 diabetes mellitus with other circulatory complications: Secondary | ICD-10-CM

## 2019-12-26 DIAGNOSIS — I152 Hypertension secondary to endocrine disorders: Secondary | ICD-10-CM

## 2019-12-26 MED ORDER — SITAGLIPTIN PHOSPHATE 50 MG PO TABS: 50.0000 mg | ORAL_TABLET | Freq: Every day | ORAL | 3 refills | Status: DC

## 2019-12-26 MED ORDER — GLIMEPIRIDE 4 MG PO TABS: 4.0000 mg | ORAL_TABLET | Freq: Two times a day (BID) | ORAL | 3 refills | Status: AC

## 2019-12-26 MED ORDER — ROSUVASTATIN CALCIUM 20 MG PO TABS: 20.0000 mg | ORAL_TABLET | Freq: Every day | ORAL | 3 refills | Status: DC

## 2019-12-26 MED ORDER — TRULICITY 4.5 MG/0.5ML ~~LOC~~ SOAJ: SUBCUTANEOUS | 3 refills | Status: DC

## 2019-12-26 NOTE — Patient Instructions (Signed)
Colace stool softener 1 cap daily to 2x/day miralax 4-6oz of liquid 1 w/ capful

## 2019-12-26 NOTE — Progress Notes (Signed)
Chief Complaint  Patient presents with  . Establish Care    TOC- 6 month f/u and med refills. (Trulicity, Glimepride,& Crestor).      HPI: *Brenda Griffin is a 74 y.o. female seen today for TOC appt, previous PCP Dr. [name], and for f/u on chronic medical issues including DM, HTN, anxiety, hypothyroidism. She is due for labs. She needs refills of some of her meds - see orders.   Pt does check BS at home. Readings: fasting - 240 Hypoglycemia/Hypergylcemic episodes: no  She was on metformin ER but that was recalled and cannot tolerate metformin due to GI side effects.   Diet: not doing well following low carb, low sugar diet and finds herself eating in response to stress/emotion Exercise: limited by foot pain but recently bought new sneakers  Lab Results  Component Value Date   HGBA1C 10.4 (A) 06/23/2019   Component     Latest Ref Rng & Units 10/11/2016 01/10/2017 04/12/2017 07/10/2017  Hemoglobin A1C     4.0 - 5.6 % 6.8 (H) 6.6 7.2 (H) 6.2   Component     Latest Ref Rng & Units 10/09/2017 01/09/2018 04/16/2018 11/22/2018  Hemoglobin A1C     4.0 - 5.6 % 7.1 (A) 7.3 (A) 8.0 (H) 9.2 (H)   Component     Latest Ref Rng & Units 03/24/2019 06/23/2019  Hemoglobin A1C     4.0 - 5.6 % 10.3 (A) 10.4 (A)    Lab Results  Component Value Date   MICROALBUR <0.7 10/09/2017   Lab Results  Component Value Date   CREATININE 0.92 08/27/2019   Lab Results  Component Value Date   CHOL 126 11/22/2018   HDL 39.40 11/22/2018   LDLDIRECT 55.0 11/22/2018   TRIG 307.0 (H) 11/22/2018   CHOLHDL 3 11/22/2018    The ASCVD Risk score (Goff DC Jr., et al., 2013) failed to calculate for the following reasons:   The valid total cholesterol range is 130 to 320 mg/dL   Past Medical History:  Diagnosis Date  . Anxiety   . Basal cell carcinoma   . Cataract   . Diabetes mellitus without complication ([place])   . Fibroid   . Gallstones   . GERD (gastroesophageal reflux disease)   . Heart murmur   .  Hypercholesterolemia   . Hypertension   . IBS (irritable bowel syndrome)   . Infertility, female   . Pneumonia   . Skin cancer   . Thyroid disease     Past Surgical History:  Procedure Laterality Date  . ABDOMINAL HYSTERECTOMY  1997   oophorectomy  . BUNIONECTOMY Bilateral 1997, 2001   right 1997, left 2001  . CATARACT EXTRACTION Bilateral 2006  . CERVICAL CONE BIOPSY  1976  . EYE SURGERY    . GALLBLADDER SURGERY    . LUMBAR DISC SURGERY    . MOHS SURGERY  2005/2007  . PILONIDAL CYST EXCISION  1975  . TONSILLECTOMY  1957    Social History   Socioeconomic History  . Marital status: Married    Spouse name: Not on file  . Number of children: 0  . Years of education: Not on file  . Highest education level: Not on file  Occupational History  . Occupation: retired  Tobacco Use  . Smoking status: Former Smoker    Packs/day: 1.00    Years: 11.00    Pack years: 11.00    Types: Cigarettes    Start date: 1972    Quit  date: 03/20/1981    Years since quitting: 38.7  . Smokeless tobacco: Never Used  Vaping Use  . Vaping Use: Never used  Substance and Sexual Activity  . Alcohol use: Yes    Comment: socially  . Drug use: No  . Sexual activity: Yes    Partners: Male    Birth control/protection: Post-menopausal  Other Topics Concern  . Not on file  Social History Narrative  . Not on file   Social Determinants of Health   Financial Resource Strain:   . Difficulty of Paying Living Expenses: Not on file  Food Insecurity:   . Worried About Charity fundraiser in the Last Year: Not on file  . Ran Out of Food in the Last Year: Not on file  Transportation Needs:   . Lack of Transportation (Medical): Not on file  . Lack of Transportation (Non-Medical): Not on file  Physical Activity:   . Days of Exercise per Week: Not on file  . Minutes of Exercise per Session: Not on file  Stress:   . Feeling of Stress : Not on file  Social Connections:   . Frequency of Communication  with Friends and Family: Not on file  . Frequency of Social Gatherings with Friends and Family: Not on file  . Attends Religious Services: Not on file  . Active Member of Clubs or Organizations: Not on file  . Attends Archivist Meetings: Not on file  . Marital Status: Not on file  Intimate Partner Violence:   . Fear of Current or Ex-Partner: Not on file  . Emotionally Abused: Not on file  . Physically Abused: Not on file  . Sexually Abused: Not on file    Family History  Problem Relation Age of Onset  . Stroke Mother   . Heart disease Father   . Hyperlipidemia Father   . Hypertension Father   . Hypertension Sister   . Heart disease Paternal Grandmother   . Hypertension Paternal Grandmother   . Crohn's disease Brother   . Diabetes Brother   . Hypertension Brother   . Diabetes Brother   . Colon cancer Maternal Great-grandmother      Immunization History  Administered Date(s) Administered  . Influenza, High Dose Seasonal PF 01/10/2017, 01/07/2018, 11/29/2018  . Influenza-Unspecified 12/07/2015, 12/10/2019  . PFIZER SARS-COV-2 Vaccination 04/26/2019, 05/17/2019  . Pneumococcal Conjugate-13 02/20/2017  . Pneumococcal Polysaccharide-23 01/09/2013  . Tdap 04/21/2013  . Zoster 04/08/2015  . Zoster Recombinat (Shingrix) 10/20/2017, 12/18/2017    Outpatient Encounter Medications as of 12/26/2019  Medication Sig  . acetaminophen (TYLENOL) 500 MG tablet Take 500 mg by mouth as needed.  . ALPRAZolam (XANAX) 0.5 MG tablet Take 1 tablet (0.5 mg total) by mouth at bedtime as needed for anxiety.  . Biotin 10000 MCG TABS Take 1 tablet by mouth daily as needed.  . butalbital-acetaminophen-caffeine (FIORICET) 50-325-40 MG tablet Take by mouth.  . Dulaglutide (TRULICITY) 4.5 YJ/8.5UD SOPN INJECT 4.5 MG UNDER THE SKIN ONCE A WEEK AS DIRECTED  . estradiol (ESTRACE) 1 MG tablet Take 1 tablet (1 mg total) by mouth daily.  Marland Kitchen Fexofenadine HCl (WAL-FEX ALLERGY PO) Take by mouth as  needed.  Marland Kitchen glimepiride (AMARYL) 4 MG tablet Take 1 tablet (4 mg total) by mouth 2 (two) times daily.  Marland Kitchen levothyroxine (SYNTHROID) 112 MCG tablet Take 1 tablet (112 mcg total) by mouth daily before breakfast.  . loperamide (IMODIUM A-D) 2 MG tablet Take 2 mg by mouth as needed for diarrhea or  loose stools.  Marland Kitchen losartan-hydrochlorothiazide (HYZAAR) 100-12.5 MG tablet TAKE 1 TABLET BY MOUTH DAILY  . montelukast (SINGULAIR) 10 MG tablet TAKE 1 TABLET(10 MG) BY MOUTH AT BEDTIME  . pantoprazole (PROTONIX) 40 MG tablet TAKE 1 TABLET(40 MG) BY MOUTH DAILY  . rosuvastatin (CRESTOR) 20 MG tablet Take 1 tablet (20 mg total) by mouth daily.  Marland Kitchen senna (SENOKOT) 8.6 MG tablet Take 1 tablet by mouth as needed for constipation.  Marland Kitchen zolpidem (AMBIEN CR) 12.5 MG CR tablet Take by mouth.  . [DISCONTINUED] glimepiride (AMARYL) 2 MG tablet Take 2 tablets (4 mg total) by mouth 2 (two) times daily.  . [DISCONTINUED] rosuvastatin (CRESTOR) 20 MG tablet Take 1 tablet (20 mg total) by mouth daily.  . [DISCONTINUED] TRULICITY 4.5 PQ/3.3AQ SOPN INJECT 4.5 MG UNDER THE SKIN ONCE A WEEK AS DIRECTED   No facility-administered encounter medications on file as of 12/26/2019.     ROS: Gen: no fever, chills  Skin: no rash, itching ENT: no ear pain, ear drainage, nasal congestion, rhinorrhea, sinus pressure, sore throat Eyes: no blurry vision, double vision Resp: no cough, wheeze,SOB CV: no CP, palpitations, LE edema,  GI: no heartburn, n/v/d/, abd pain; + chronic constipation GU: no dysuria, urgency, frequency, hematuria  MSK: no joint pain, myalgias, back pain Neuro: + dizziness, headache, weakness Psych: no depression, anxiety, + insomnia   Allergies  Allergen Reactions  . Bacitracin Rash  . Benzoic Acid Rash  . Benzyl Salicylate Rash  . Formaldehyde Rash  . Geraniol Rash  . Hydroxyethyl Methacrylate Rash  . Methyl Methacrylate [Methylmethacrylate] Rash  . Other Rash    Amyl Cinnamaldehyde  . Parabens Hives    . Adhesive [Tape] Itching  . Amitiza [Lubiprostone] Hives and Diarrhea  . Aspirin Nausea And Vomiting  . Codeine Nausea And Vomiting  . Victoza [Liraglutide] Diarrhea  . Butanediol [Butylene Glycol] Rash    BP 120/72   Pulse (!) 106   Temp (!) 96.5 F (35.8 C) (Temporal)   Ht 5\' 6"  (1.676 m)   Wt 209 lb (94.8 kg)   SpO2 96%   BMI 33.73 kg/m   Physical Exam Constitutional:      Appearance: Normal appearance. She is not ill-appearing.  Cardiovascular:     Rate and Rhythm: Normal rate and regular rhythm.     Pulses: Normal pulses.  Pulmonary:     Effort: Pulmonary effort is normal. No respiratory distress.     Breath sounds: No wheezing or rhonchi.  Abdominal:     General: Bowel sounds are normal.     Palpations: Abdomen is soft.  Neurological:     Mental Status: She is alert and oriented to person, place, and time.  Psychiatric:        Mood and Affect: Mood normal.        Behavior: Behavior normal.      A/P:  1. Type 2 diabetes mellitus with other specified complication, without long-term current use of insulin (HCC) - uncontrolled, home fasting BS around 240 - Hemoglobin A1c; Future - Microalbumin / creatinine urine ratio; Future - unable to tolerate metformin d/t GI side effects (diarrhea) Refill: - Dulaglutide (TRULICITY) 4.5 TM/2.2QJ SOPN; INJECT 4.5 MG UNDER THE SKIN ONCE A WEEK AS DIRECTED  Dispense: 2 mL; Refill: 3 - glimepiride (AMARYL) 4 MG tablet; Take 1 tablet (4 mg total) by mouth 2 (two) times daily.  Dispense: 180 tablet; Refill: 3 Rx: - januiva 50mg  1 tab po daily - Lipid panel; Future - cont hyzaar -  f/u in 3 mo or sooner PRN  2. Acquired hypothyroidism - TFTs WNL in 12/2018 - cont levothyroxine 135mcg daily - TSH; Future - T4, free; Future  3. Hypertension associated with diabetes ([place]), Rx Hyzaar - controlled, at goal - cont losartan-HCTZ 100/12.5mg  daily - Comprehensive metabolic panel; Future  4. Anxiety, Rx Xanax, uses sparingly -  controlled, stable - med refill not currently needed  5. Hyperlipidemia associated with type 2 diabetes mellitus ([place]) - at goal on last FLP in 12/2018 Refill: - rosuvastatin (CRESTOR) 20 MG tablet; Take 1 tablet (20 mg total) by mouth daily.  Dispense: 90 tablet; Refill: 3 - Lipid panel; Future  6. Chronic constipation - daily colace and miralax 1/2 to 1 capful daily - goal is 1 soft, easy to pass BM per day  This visit occurred during the SARS-CoV-2 public health emergency.  Safety protocols were in place, including screening questions prior to the visit, additional usage of staff PPE, and extensive cleaning of exam room while observing appropriate contact time as indicated for disinfecting solutions.

## 2019-12-29 ENCOUNTER — Encounter: Payer: Self-pay | Admitting: Family Medicine

## 2019-12-29 ENCOUNTER — Other Ambulatory Visit: Payer: Self-pay | Admitting: Family Medicine

## 2019-12-29 DIAGNOSIS — E785 Hyperlipidemia, unspecified: Secondary | ICD-10-CM

## 2019-12-29 DIAGNOSIS — E1169 Type 2 diabetes mellitus with other specified complication: Secondary | ICD-10-CM

## 2019-12-30 ENCOUNTER — Other Ambulatory Visit: Payer: Medicare Other

## 2019-12-30 ENCOUNTER — Telehealth: Payer: Self-pay

## 2019-12-30 ENCOUNTER — Telehealth: Payer: Self-pay | Admitting: Family Medicine

## 2019-12-30 DIAGNOSIS — E1159 Type 2 diabetes mellitus with other circulatory complications: Secondary | ICD-10-CM

## 2019-12-30 DIAGNOSIS — E1169 Type 2 diabetes mellitus with other specified complication: Secondary | ICD-10-CM

## 2019-12-30 DIAGNOSIS — I152 Hypertension secondary to endocrine disorders: Secondary | ICD-10-CM

## 2019-12-30 NOTE — Telephone Encounter (Signed)
Alex, are you able to help find an additional DM med that is not cost-prohibitive for this patient? She is not able to tolerate metformin d/t Gi side effects

## 2019-12-30 NOTE — Telephone Encounter (Signed)
Per Dr C, referral to CCM needed placed to help with diabetes medication management.  Order placed. Dm/cma

## 2019-12-30 NOTE — Progress Notes (Signed)
  Chronic Care Management   Note  12/30/2019 Name: REBEKA KIMBLE MRN: [id number] DOB: 03/20/1946  Denton Meek is a 74 y.o. year old female who is a primary care patient of Vivi Barrack, MD. I reached out to Denton Meek by phone today in response to a referral sent by Ms. Michaelle Copas Neely's PCP, Vivi Barrack, MD.   Ms. Robeck was given information about Chronic Care Management services today including:  1. CCM service includes personalized support from designated clinical staff supervised by her physician, including individualized plan of care and coordination with other care providers 2. 24/7 contact phone numbers for assistance for urgent and routine care needs. 3. Service will only be billed when office clinical staff spend 20 minutes or more in a month to coordinate care. 4. Only one practitioner may furnish and bill the service in a calendar month. 5. The patient may stop CCM services at any time (effective at the end of the month) by phone call to the office staff.   Patient agreed to services and verbal consent obtained.   Follow up plan:   Carley Perdue [employer] Scheduler

## 2019-12-30 NOTE — Telephone Encounter (Signed)
Brenda Griffin, Can you please place a referral to chronic care management for this patient, and change her PCP to Dr. Loletha Grayer?   Thanks, [name] Section Clinical Pharmacist [facility] at [facility]  [phone]

## 2019-12-30 NOTE — Telephone Encounter (Signed)
Certainly!  It sounds like she might be in the [facility] given that cost. If she cannot tolerate metformin, the next thing I might try would be an SGLT2 like Jardiance. Its still a T3 medication, but we could see if we can get her approved through patient assistance and if so that could be a good option for the patient. Otherwise we are stuck with options like Actos from a cost standpoint. Would you like her to be seen by me for chronic care management?    Thanks, [name] Section Clinical Pharmacist [facility] at [facility]  [phone]

## 2020-01-02 ENCOUNTER — Other Ambulatory Visit (INDEPENDENT_AMBULATORY_CARE_PROVIDER_SITE_OTHER): Payer: Medicare Other

## 2020-01-02 ENCOUNTER — Other Ambulatory Visit: Payer: Self-pay

## 2020-01-02 DIAGNOSIS — E1159 Type 2 diabetes mellitus with other circulatory complications: Secondary | ICD-10-CM | POA: Diagnosis not present

## 2020-01-02 DIAGNOSIS — E1169 Type 2 diabetes mellitus with other specified complication: Secondary | ICD-10-CM

## 2020-01-02 DIAGNOSIS — E039 Hypothyroidism, unspecified: Secondary | ICD-10-CM | POA: Diagnosis not present

## 2020-01-02 DIAGNOSIS — I152 Hypertension secondary to endocrine disorders: Secondary | ICD-10-CM

## 2020-01-02 DIAGNOSIS — E785 Hyperlipidemia, unspecified: Secondary | ICD-10-CM

## 2020-01-02 LAB — LIPID PANEL
Cholesterol: 152 mg/dL (ref 0–200)
HDL: 48.4 mg/dL (ref >39.00)
Total CHOL/HDL Ratio: 3
Triglycerides: 503 mg/dL — ABNORMAL HIGH (ref 0.0–149.0)

## 2020-01-02 LAB — COMPREHENSIVE METABOLIC PANEL
ALT: 19 U/L (ref 0–35)
AST: 22 U/L (ref 0–37)
Albumin: 3.9 g/dL (ref 3.5–5.2)
Alkaline Phosphatase: 75 U/L (ref 39–117)
BUN: 15 mg/dL (ref 6–23)
CO2: 23 mEq/L (ref 19–32)
Calcium: 9.2 mg/dL (ref 8.4–10.5)
Chloride: 97 mEq/L (ref 96–112)
Creatinine, Ser: 0.91 mg/dL (ref 0.40–1.20)
GFR: 63.02 mL/min (ref >60.00)
Glucose, Bld: 424 mg/dL — ABNORMAL HIGH (ref 70–99)
Potassium: 4.2 mEq/L (ref 3.5–5.1)
Sodium: 131 mEq/L — ABNORMAL LOW (ref 135–145)
Total Bilirubin: 0.5 mg/dL (ref 0.2–1.2)
Total Protein: 6.6 g/dL (ref 6.0–8.3)

## 2020-01-02 LAB — T4, FREE: Free T4: 2.46 ng/dL — ABNORMAL HIGH (ref 0.60–1.60)

## 2020-01-02 LAB — MICROALBUMIN / CREATININE URINE RATIO
Creatinine,U: 50.2 mg/dL
Microalb Creat Ratio: 1.4 mg/g (ref 0.0–30.0)
Microalb, Ur: <0.7 mg/dL (ref 0.0–1.9)

## 2020-01-02 LAB — TSH: TSH: 2.2 u[IU]/mL (ref 0.35–4.50)

## 2020-01-02 LAB — LDL CHOLESTEROL, DIRECT: Direct LDL: 61 mg/dL

## 2020-01-02 LAB — HEMOGLOBIN A1C: Hgb A1c MFr Bld: 11.3 % — ABNORMAL HIGH (ref 4.6–6.5)

## 2020-01-07 ENCOUNTER — Encounter: Payer: Self-pay | Admitting: Family Medicine

## 2020-01-07 NOTE — Progress Notes (Signed)
74 y.o. [place] Married White or Caucasian Not Hispanic or Latino female here for annual exam.  H/O TAH, on ERT. Has felt miserable when she has tried to wean down on ERT. She hasn't tolerated the patch, insurance won't cover the estrogen gel. She is willing to try to go down on her dose. No current vasomotor symptoms.  No vaginal bleeding, not sexually active, no vaginal c/o.   H/O DM, HTN, elevated lipids. Her recent HgbA1C was 11.3%. She has been eating too much ice cream, she is making dietary changes. She is discussing medication with her primary.   She has intermittent issues with constipation then diarrhea. Currently struggling with constipation. Her primary has her on daily colace, helping some. She hasn't tolerated metamucil previously but will try it again.     No LMP recorded. Patient is postmenopausal.          Sexually active: No.  The current method of family planning is post menopausal status.    Exercising: No.  The patient does not participate in regular exercise at present. Smoker:  no  Health Maintenance: Pap: 10/15/2020WNL HR HPV Neg  2015 WNL per patient  History of abnormal Pap: Yes- Cervical [place] biopsy   MMG:  04/17/19 Bi-rads 1 neg  BMD:  04/05/16 WNL  Colonoscopy: 12/30/2018 IBS, polyps, f/u in 3-5 years.  TDaP:  04/21/2013 Gardasil: NA   reports that she quit smoking about 38 years ago. Her smoking use included cigarettes. She started smoking about 49 years ago. She has a 11.00 pack-year smoking history. She has never used smokeless tobacco. She reports current alcohol use. She reports that she does not use drugs. Rare ETOH. Retired, moved here in 3/18 from [place]  Past Medical History:  Diagnosis Date   Anxiety    Basal cell carcinoma    Cataract    Diabetes mellitus without complication ([place])    Fibroid    Gallstones    GERD (gastroesophageal reflux disease)    Heart murmur    Hypercholesterolemia    Hypertension    IBS (irritable bowel  syndrome)    Infertility, female    Pneumonia    Skin cancer    Thyroid disease     Past Surgical History:  Procedure Laterality Date   ABDOMINAL HYSTERECTOMY  1997   oophorectomy   BUNIONECTOMY Bilateral 1997, 2001   right 1997, left 2001   CATARACT EXTRACTION Bilateral 2006   [place]  2005/2007   PILONIDAL CYST EXCISION  1975   TONSILLECTOMY  1957    Current Outpatient Medications  Medication Sig Dispense Refill   acetaminophen (TYLENOL) 500 MG tablet Take 500 mg by mouth as needed.     ALPRAZolam (XANAX) 0.5 MG tablet Take 1 tablet (0.5 mg total) by mouth at bedtime as needed for anxiety. 30 tablet 2   Biotin 10000 MCG TABS Take 1 tablet by mouth daily as needed.     butalbital-acetaminophen-caffeine (FIORICET) 50-325-40 MG tablet Take by mouth.     Dulaglutide (TRULICITY) 4.5 HE/5.2DP SOPN INJECT 4.5 MG UNDER THE SKIN ONCE A WEEK AS DIRECTED 2 mL 3   estradiol (ESTRACE) 1 MG tablet Take 1 tablet (1 mg total) by mouth daily. 90 tablet 3   Fexofenadine HCl (WAL-FEX ALLERGY PO) Take by mouth as needed.     glimepiride (AMARYL) 4 MG tablet Take  1 tablet (4 mg total) by mouth 2 (two) times daily. 180 tablet 3   levothyroxine (SYNTHROID) 112 MCG tablet Take 1 tablet (112 mcg total) by mouth daily before breakfast. 30 tablet 0   loperamide (IMODIUM A-D) 2 MG tablet Take 2 mg by mouth as needed for diarrhea or loose stools.     losartan-hydrochlorothiazide (HYZAAR) 100-12.5 MG tablet TAKE 1 TABLET BY MOUTH DAILY 90 tablet 1   montelukast (SINGULAIR) 10 MG tablet TAKE 1 TABLET(10 MG) BY MOUTH AT BEDTIME 30 tablet 3   pantoprazole (PROTONIX) 40 MG tablet TAKE 1 TABLET(40 MG) BY MOUTH DAILY 90 tablet 3   rosuvastatin (CRESTOR) 20 MG tablet TAKE 1 TABLET(20 MG) BY MOUTH DAILY 90 tablet 3   senna (SENOKOT) 8.6 MG tablet Take 1 tablet by mouth as needed for  constipation.     No current facility-administered medications for this visit.    Family History  Problem Relation Age of Onset   Stroke Mother    Heart disease Father    Hyperlipidemia Father    Hypertension Father    Hypertension Sister    Heart disease Paternal Grandmother    Hypertension Paternal Grandmother    Crohn's disease Brother    Diabetes Brother    Hypertension Brother    Diabetes Brother    Colon cancer Maternal Great-grandmother     Review of Systems  All other systems reviewed and are negative.   Exam:   BP 122/64    Pulse (!) 105    Ht 5\' 6"  (1.676 m)    Wt 208 lb (94.3 kg)    SpO2 98%    BMI 33.57 kg/m   Weight change: @WEIGHTCHANGE @ Height:   Height: 5\' 6"  (167.6 cm)  Ht Readings from Last 3 Encounters:  01/08/20 5\' 6"  (1.676 m)  12/26/19 5\' 6"  (1.676 m)  09/02/19 5\' 6"  (1.676 m)    General appearance: alert, cooperative and appears stated age Head: Normocephalic, without obvious abnormality, atraumatic Neck: no adenopathy, supple, symmetrical, trachea midline and thyroid normal to inspection and palpation Lungs: clear to auscultation bilaterally Cardiovascular: regular rate and rhythm Breasts: normal appearance, no masses or tenderness Abdomen: soft, non-tender; non distended,  no masses,  no organomegaly Extremities: extremities normal, atraumatic, no cyanosis or edema Skin: Skin color, texture, turgor normal. No rashes or lesions Lymph nodes: Cervical, supraclavicular, and axillary nodes normal. No abnormal inguinal nodes palpated Neurologic: Grossly normal   Pelvic: External genitalia:  no lesions              Urethra:  normal appearing urethra with no masses, tenderness or lesions              Bartholins and Skenes: normal                 Vagina: normal appearing vagina with normal color and discharge, no lesions              Cervix: absent               Bimanual Exam:  Uterus:  uterus absent              Adnexa: no mass,  fullness, tenderness               Rectovaginal: deferred  Gae Dry chaperoned for the exam.  A:  Well Woman with normal exam  H/O TAH  On ERT  Uncontrolled DM, understands the importance of getting her DM under control. Will f/u with her  primary  Multiple medical issues, which increase her risk of heart disease. She understands the risk of estrogen, but feels it is a quality of life issue. She is willing to go down on the estrogen. She understands the risk of MI  Constipation.    P:   No pap needed  Will decrease estrogen to 0.5 mg of oral estrogen (can't take the patch or get the gel)  Mammogram in 1/22  Colonoscopy UTD  Discussed breast self exam  Discussed calcium and vit D intake  In addition to the breast and pelvic exam over 20 minutes was spent in total patient care, mostly in counseling

## 2020-01-08 ENCOUNTER — Other Ambulatory Visit: Payer: Self-pay

## 2020-01-08 ENCOUNTER — Ambulatory Visit (INDEPENDENT_AMBULATORY_CARE_PROVIDER_SITE_OTHER): Payer: Medicare Other | Admitting: Obstetrics and Gynecology

## 2020-01-08 ENCOUNTER — Encounter: Payer: Self-pay | Admitting: Obstetrics and Gynecology

## 2020-01-08 VITALS — BP 122/64 | HR 105 | Ht 66.0 in | Wt 208.0 lb

## 2020-01-08 DIAGNOSIS — E1159 Type 2 diabetes mellitus with other circulatory complications: Secondary | ICD-10-CM

## 2020-01-08 DIAGNOSIS — I152 Hypertension secondary to endocrine disorders: Secondary | ICD-10-CM

## 2020-01-08 DIAGNOSIS — Z79890 Hormone replacement therapy (postmenopausal): Secondary | ICD-10-CM

## 2020-01-08 DIAGNOSIS — Z5181 Encounter for therapeutic drug level monitoring: Secondary | ICD-10-CM | POA: Diagnosis not present

## 2020-01-08 DIAGNOSIS — Z8639 Personal history of other endocrine, nutritional and metabolic disease: Secondary | ICD-10-CM | POA: Diagnosis not present

## 2020-01-08 DIAGNOSIS — E1169 Type 2 diabetes mellitus with other specified complication: Secondary | ICD-10-CM

## 2020-01-08 DIAGNOSIS — Z01419 Encounter for gynecological examination (general) (routine) without abnormal findings: Secondary | ICD-10-CM

## 2020-01-08 DIAGNOSIS — E785 Hyperlipidemia, unspecified: Secondary | ICD-10-CM

## 2020-01-08 MED ORDER — ESTRADIOL 0.5 MG PO TABS: 0.5000 mg | ORAL_TABLET | Freq: Every day | ORAL | 3 refills | Status: AC

## 2020-01-08 NOTE — Patient Instructions (Signed)
EXERCISE AND DIET:  We recommended that you start or continue a regular exercise program for good health. Regular exercise means any activity that makes your heart beat faster and makes you sweat.  We recommend exercising at least 30 minutes per day at least 3 days a week, preferably 4 or 5.  We also recommend a diet low in fat and sugar.  Inactivity, poor dietary choices and obesity can cause diabetes, heart attack, stroke, and kidney damage, among others.    ALCOHOL AND SMOKING:  Women should limit their alcohol intake to no more than 7 drinks/beers/glasses of wine (combined, not each!) per week. Moderation of alcohol intake to this level decreases your risk of breast cancer and liver damage. And of course, no recreational drugs are part of a healthy lifestyle.  And absolutely no smoking or even second hand smoke. Most people know smoking can cause heart and lung diseases, but did you know it also contributes to weakening of your bones? Aging of your skin?  Yellowing of your teeth and nails?  CALCIUM AND VITAMIN D:  Adequate intake of calcium and Vitamin D are recommended.  The recommendations for exact amounts of these supplements seem to change often, but generally speaking 1,200 mg of calcium (between diet and supplement) and 800 units of Vitamin D per day seems prudent. Certain women may benefit from higher intake of Vitamin D.  If you are among these women, your doctor will have told you during your visit.    PAP SMEARS:  Pap smears, to check for cervical cancer or precancers,  have traditionally been done yearly, although recent scientific advances have shown that most women can have pap smears less often.  However, every woman still should have a physical exam from her gynecologist every year. It will include a breast check, inspection of the vulva and vagina to check for abnormal growths or skin changes, a visual exam of the cervix, and then an exam to evaluate the size and shape of the uterus and  ovaries.  And after 72 years of age, a rectal exam is indicated to check for rectal cancers. We will also provide age appropriate advice regarding health maintenance, like when you should have certain vaccines, screening for sexually transmitted diseases, bone density testing, colonoscopy, mammograms, etc.   MAMMOGRAMS:  All women over 40 years old should have a yearly mammogram. Many facilities now offer a "3D" mammogram, which may cost around $50 extra out of pocket. If possible,  we recommend you accept the option to have the 3D mammogram performed.  It both reduces the number of women who will be called back for extra views which then turn out to be normal, and it is better than the routine mammogram at detecting truly abnormal areas.    COLON CANCER SCREENING: Now recommend starting at age 45. At this time colonoscopy is not covered for routine screening until 50. There are take home tests that can be done between 45-49.   COLONOSCOPY:  Colonoscopy to screen for colon cancer is recommended for all women at age 50.  We know, you hate the idea of the prep.  We agree, BUT, having colon cancer and not knowing it is worse!!  Colon cancer so often starts as a polyp that can be seen and removed at colonscopy, which can quite literally save your life!  And if your first colonoscopy is normal and you have no family history of colon cancer, most women don't have to have it again for   10 years.  Once every ten years, you can do something that may end up saving your life, right?  We will be happy to help you get it scheduled when you are ready.  Be sure to check your insurance coverage so you understand how much it will cost.  It may be covered as a preventative service at no cost, but you should check your particular policy.      Breast Self-Awareness Breast self-awareness means being familiar with how your breasts look and feel. It involves checking your breasts regularly and reporting any changes to your  health care provider. Practicing breast self-awareness is important. A change in your breasts can be a sign of a serious medical problem. Being familiar with how your breasts look and feel allows you to find any problems early, when treatment is more likely to be successful. All women should practice breast self-awareness, including women who have had breast implants. How to do a breast self-exam One way to learn what is normal for your breasts and whether your breasts are changing is to do a breast self-exam. To do a breast self-exam: Look for Changes  1. Remove all the clothing above your waist. 2. Stand in front of a mirror in a room with good lighting. 3. Put your hands on your hips. 4. Push your hands firmly downward. 5. Compare your breasts in the mirror. Look for differences between them (asymmetry), such as: ? Differences in shape. ? Differences in size. ? Puckers, dips, and bumps in one breast and not the other. 6. Look at each breast for changes in your skin, such as: ? Redness. ? Scaly areas. 7. Look for changes in your nipples, such as: ? Discharge. ? Bleeding. ? Dimpling. ? Redness. ? A change in position. Feel for Changes Carefully feel your breasts for lumps and changes. It is best to do this while lying on your back on the floor and again while sitting or standing in the shower or tub with soapy water on your skin. Feel each breast in the following way:  Place the arm on the side of the breast you are examining above your head.  Feel your breast with the other hand.  Start in the nipple area and make  inch (2 cm) overlapping circles to feel your breast. Use the pads of your three middle fingers to do this. Apply light pressure, then medium pressure, then firm pressure. The light pressure will allow you to feel the tissue closest to the skin. The medium pressure will allow you to feel the tissue that is a little deeper. The firm pressure will allow you to feel the tissue  close to the ribs.  Continue the overlapping circles, moving downward over the breast until you feel your ribs below your breast.  Move one finger-width toward the center of the body. Continue to use the  inch (2 cm) overlapping circles to feel your breast as you move slowly up toward your collarbone.  Continue the up and down exam using all three pressures until you reach your armpit.  Write Down What You Find  Write down what is normal for each breast and any changes that you find. Keep a written record with breast changes or normal findings for each breast. By writing this information down, you do not need to depend only on memory for size, tenderness, or location. Write down where you are in your menstrual cycle, if you are still menstruating. If you are having trouble noticing differences   in your breasts, do not get discouraged. With time you will become more familiar with the variations in your breasts and more comfortable with the exam. How often should I examine my breasts? Examine your breasts every month. If you are breastfeeding, the best time to examine your breasts is after a feeding or after using a breast pump. If you menstruate, the best time to examine your breasts is 5-7 days after your period is over. During your period, your breasts are lumpier, and it may be more difficult to notice changes. When should I see my health care provider? See your health care provider if you notice:  A change in shape or size of your breasts or nipples.  A change in the skin of your breast or nipples, such as a reddened or scaly area.  Unusual discharge from your nipples.  A lump or thick area that was not there before.  Pain in your breasts.  Anything that concerns you.  Menopause and Hormone Replacement Therapy Menopause is a normal time of life when menstrual periods stop completely and the ovaries stop producing the female hormones estrogen and progesterone. This lack of hormones  can affect your health and cause undesirable symptoms. Hormone replacement therapy (HRT) can relieve some of those symptoms. What is hormone replacement therapy? HRT is the use of artificial (synthetic) hormones to replace hormones that your body has stopped producing because you have reached menopause. What are my options for HRT?  HRT may consist of the synthetic hormones estrogen and progestin, or it may consist of only estrogen (estrogen-only therapy). You and your health care provider will decide which form of HRT is best for you. If you choose to be on HRT and you have a uterus, estrogen and progestin are usually prescribed. Estrogen-only therapy is used for women who do not have a uterus. Possible options for taking HRT include:  Pills.  Patches.  Gels.  Sprays.  Vaginal cream.  Vaginal rings.  Vaginal inserts. The amount of hormone(s) that you take and how long you take the hormone(s) varies according to your health. It is important to:  Begin HRT with the lowest possible dosage.  Stop HRT as soon as your health care provider tells you to stop.  Work with your health care provider so that you feel informed and comfortable with your decisions. What are the benefits of HRT? HRT can reduce the frequency and severity of menopausal symptoms. Benefits of HRT vary according to the kind of symptoms that you have, how severe they are, and your overall health. HRT may help to improve the following symptoms of menopause:  Hot flashes and night sweats. These are sudden feelings of heat that spread over the face and body. The skin may turn red, like a blush. Night sweats are hot flashes that happen while you are sleeping or trying to sleep.  Bone loss (osteoporosis). The body loses calcium more quickly after menopause, causing the bones to become weaker. This can increase the risk for bone breaks (fractures).  Vaginal dryness. The lining of the vagina can become thin and dry, which can  cause pain during sex or cause infection, burning, or itching.  Urinary tract infections.  Urinary incontinence. This is the inability to control when you pass urine.  Irritability.  Short-term memory problems. What are the risks of HRT? Risks of HRT vary depending on your individual health and medical history. Risks of HRT also depend on whether you receive both estrogen and progestin or you  receive estrogen only. HRT may increase the risk of:  Spotting. This is when a small amount of blood leaks from the vagina unexpectedly.  Endometrial cancer. This cancer is in the lining of the uterus (endometrium).  Breast cancer.  Increased density of breast tissue. This can make it harder to find breast cancer on a breast X-ray (mammogram).  Stroke.  Heart disease.  Blood clots.  Gallbladder disease.  Liver disease. Risks of HRT can increase if you have any of the following conditions:  Endometrial cancer.  Liver disease.  Heart disease.  Breast cancer.  History of blood clots.  History of stroke. Follow these instructions at home:  Take over-the-counter and prescription medicines only as told by your health care provider.  Get mammograms, pelvic exams, and medical checkups as often as told by your health care provider.  Have Pap tests done as often as told by your health care provider. A Pap test is sometimes called a Pap smear. It is a screening test that is used to check for signs of cancer of the cervix and vagina. A Pap test can also identify the presence of infection or precancerous changes. Pap tests may be done: ? Every 3 years, starting at age 62. ? Every 5 years, starting after age 25, in combination with testing for human papillomavirus (HPV). ? More often or less often depending on other medical conditions you have, your age, and other risk factors.  It is up to you to get the results of your Pap test. Ask your health care provider, or the department that is  doing the test, when your results will be ready.  Keep all follow-up visits as told by your health care provider. This is important. Contact a health care provider if you have:  Pain or swelling in your legs.  Shortness of breath.  Chest pain.  Lumps or changes in your breasts or armpits.  Slurred speech.  Pain, burning, or bleeding when you urinate.  Unusual vaginal bleeding.  Dizziness or headaches.  Weakness or numbness in any part of your arms or legs.  Pain in your abdomen. Summary  Menopause is a normal time of life when menstrual periods stop completely and the ovaries stop producing the female hormones estrogen and progesterone.  Hormone replacement therapy (HRT) can relieve some of the symptoms of menopause.  HRT can reduce the frequency and severity of menopausal symptoms.  Risks of HRT vary depending on your individual health and medical history. This information is not intended to replace advice given to you by your health care provider. Make sure you discuss any questions you have with your health care provider. Document Revised: 11/06/2017 Document Reviewed: 11/06/2017 [employer]vier Patient Education  2020 [employer].

## 2020-01-14 ENCOUNTER — Encounter: Payer: Self-pay | Admitting: Family Medicine

## 2020-01-14 ENCOUNTER — Other Ambulatory Visit: Payer: Self-pay

## 2020-01-14 DIAGNOSIS — E039 Hypothyroidism, unspecified: Secondary | ICD-10-CM

## 2020-01-14 MED ORDER — LEVOTHYROXINE SODIUM 112 MCG PO TABS: 112.0000 ug | ORAL_TABLET | Freq: Every day | ORAL | 3 refills | Status: DC

## 2020-01-14 NOTE — Telephone Encounter (Signed)
Received a my chart message asking for refill on Levothyroxine,  LR 12/02/19, LOV10/2021, FOV 03/2020

## 2020-01-16 ENCOUNTER — Other Ambulatory Visit: Payer: Self-pay | Admitting: Family Medicine

## 2020-01-16 ENCOUNTER — Encounter: Payer: Self-pay | Admitting: Family Medicine

## 2020-01-19 ENCOUNTER — Ambulatory Visit: Payer: Medicare Other

## 2020-01-19 ENCOUNTER — Telehealth: Payer: Self-pay | Admitting: Family Medicine

## 2020-01-19 DIAGNOSIS — E119 Type 2 diabetes mellitus without complications: Secondary | ICD-10-CM

## 2020-01-19 DIAGNOSIS — E1169 Type 2 diabetes mellitus with other specified complication: Secondary | ICD-10-CM

## 2020-01-19 NOTE — Chronic Care Management (AMB) (Signed)
 Chronic Care Management Pharmacy  Name: Brenda Griffin  MRN: [id number] DOB: 10/01/1945   Chief Complaint/ HPI  Brenda Griffin,  74 y.o. , female presents for their Initial CCM visit with the clinical pharmacist via telephone.  PCP : Cirigliano, Mary K, DO Patient Care Team: Cirigliano, Mary K, DO as PCP - General (Family Medicine) Jertson, Jill Evelyn, MD as Consulting Physician (Obstetrics and Gynecology) Mammography, [facility] (Diagnostic Radiology) Zamora, Brian, MD as Consulting Physician (Ophthalmology) Danis, Henry L III, MD as Consulting Physician (Gastroenterology) Fleury, Alexandre A, RPH as Pharmacist (Pharmacist)  Their chronic conditions include: Hypertension, Hyperlipidemia, Diabetes, GERD and Hypothyroidism   Office Visits: 12/26/19: Patient presented to Dr. Cirigliano to establish care. A1c worsened to 11.3%. Glimepiride increased to 4 mg twice daily. Januvia started (patient cannot afford). Patient uninterested in starting insulin at this time.   09/02/19: Patient presented to Dr. Parker for vertigo. CT showed carotid artery stenosis. Referral placed to vascular surgery.   Consult Visit: 01/08/20: Patient presented to Dr. Jertson (OBGYN) for follow-up. Estradiol decreased to 0.5 mg daily, Zolpidem stopped (patient no longer taking).  10/14/19: Patient presented to Dr. Davis (Vascular Surgery) for carotid artery stenosis consult. No medication changes made.   Allergies  Allergen Reactions  . Bacitracin Rash  . Benzoic Acid Rash  . Benzyl Salicylate Rash  . Formaldehyde Rash  . Geraniol Rash  . Hydroxyethyl Methacrylate Rash  . Methyl Methacrylate [Methylmethacrylate] Rash  . Other Rash    Amyl Cinnamaldehyde  . Parabens Hives  . Amitiza [Lubiprostone] Hives and Diarrhea  . Aspirin Nausea And Vomiting  . Codeine Nausea And Vomiting  . Victoza [Liraglutide] Diarrhea  . Butanediol [Butylene Glycol] Rash  . Tape Itching and Rash    Medications: Outpatient  Encounter Medications as of 01/19/2020  Medication Sig  . acetaminophen (TYLENOL) 500 MG tablet Take 500 mg by mouth as needed.  . ALPRAZolam (XANAX) 0.5 MG tablet Take 1 tablet (0.5 mg total) by mouth at bedtime as needed for anxiety.  . Biotin 10000 MCG TABS Take 1 tablet by mouth daily as needed.  . butalbital-acetaminophen-caffeine (FIORICET) 50-325-40 MG tablet Take 1 tablet by mouth daily as needed for migraine.   . Dulaglutide (TRULICITY) 4.5 MG/0.5ML SOPN INJECT 4.5 MG UNDER THE SKIN ONCE A WEEK AS DIRECTED  . estradiol (ESTRACE) 0.5 MG tablet Take 1 tablet (0.5 mg total) by mouth daily.  . Fexofenadine HCl (WAL-FEX ALLERGY PO) Take 1 tablet by mouth as needed.   . glimepiride (AMARYL) 4 MG tablet Take 1 tablet (4 mg total) by mouth 2 (two) times daily.  . levothyroxine (SYNTHROID) 112 MCG tablet Take 1 tablet (112 mcg total) by mouth daily before breakfast.  . losartan-hydrochlorothiazide (HYZAAR) 100-12.5 MG tablet TAKE 1 TABLET BY MOUTH DAILY  . pantoprazole (PROTONIX) 40 MG tablet TAKE 1 TABLET(40 MG) BY MOUTH DAILY  . rosuvastatin (CRESTOR) 20 MG tablet TAKE 1 TABLET(20 MG) BY MOUTH DAILY  . sitaGLIPtin (JANUVIA) 100 MG tablet Take 100 mg by mouth daily.  . loperamide (IMODIUM A-D) 2 MG tablet Take 2 mg by mouth as needed for diarrhea or loose stools. (Patient not taking: Reported on 01/19/2020)  . montelukast (SINGULAIR) 10 MG tablet TAKE 1 TABLET(10 MG) BY MOUTH AT BEDTIME  . senna (SENOKOT) 8.6 MG tablet Take 1 tablet by mouth as needed for constipation. (Patient not taking: Reported on 01/19/2020)   No facility-administered encounter medications on file as of 01/19/2020.    Wt Readings from Last 3   Encounters:  01/08/20 208 lb (94.3 kg)  12/26/19 209 lb (94.8 kg)  09/02/19 211 lb 3.2 oz (95.8 kg)    Current Diagnosis/Assessment:  SDOH Interventions     Most Recent Value  SDOH Interventions  Financial Strain Interventions Patient Refused  Transportation Interventions  Intervention Not Indicated      Goals Addressed            This Visit's Progress   . Chronic Care Management       CARE PLAN ENTRY (see longitudinal plan of care for additional care plan information)  Current Barriers:  . Chronic Disease Management support, education, and care coordination needs related to  Hypertension, Hyperlipidemia, Diabetes, GERD and Hypothyroidism    Hypertension BP Readings from Last 3 Encounters:  01/08/20 122/64  12/26/19 120/72  09/02/19 124/66   . Pharmacist Clinical Goal(s): o Over the next 90 days, patient will work with PharmD and providers to maintain BP goal <130/80 . Current regimen:  o Losartan-HCTZ 100-12.5 mg daily . Interventions: o Discussed low salt diet and exercising as tolerated extensively o Will initiate blood pressure monitoring plan  . Patient self care activities - Over the next 90 days, patient will: o Check Blood Pressure 1-2 times monthly, document, and provide at future appointments o Ensure daily salt intake < 2300 mg/day  Hyperlipidemia Lab Results  Component Value Date/Time   LDLDIRECT 61.0 01/02/2020 01:19 PM   . Pharmacist Clinical Goal(s): o Over the next 90 days, patient will work with PharmD and providers to maintain LDL goal < 70 . Current regimen:  o Rosuvastatin 20 mg daily  . Interventions: o Discussed low cholesterol diet and exercising as tolerated extensively o Will initiate cholesterol monitoring plan   Diabetes Lab Results  Component Value Date/Time   HGBA1C 11.3 (H) 01/02/2020 01:19 PM   HGBA1C 10.4 (A) 06/23/2019 01:42 PM   HGBA1C 10.3 (A) 03/24/2019 01:41 PM   HGBA1C 9.2 (H) 11/22/2018 08:14 AM   . Pharmacist Clinical Goal(s): o Over the next 90 days, patient will work with PharmD and providers to achieve A1c goal <7% . Current regimen:  . Trulicity 4.5 mg weekly  . Glimepiride 4 mg twice daily . Januvia 100 mg daily  . Interventions: o Discussed carbohydrate counting and exercising  as tolerated extensively o Will initiate blood sugar monitoring plan  . Patient self care activities - Over the next 90 days, patient will: o Check blood sugar once daily, document, and provide at future appointments o Contact provider with any episodes of hypoglycemia  Medication management . Pharmacist Clinical Goal(s): o Over the next 90 days, patient will work with PharmD and providers to maintain optimal medication adherence . Current pharmacy: [facility] . Interventions o Comprehensive medication review performed. o Continue current medication management strategy . Patient self care activities - Over the next 90 days, patient will: o Take medications as prescribed o Report any questions or concerns to PharmD and/or provider(s)      Hypertension   BP goal is:  <130/80  Office blood pressures are  BP Readings from Last 3 Encounters:  01/08/20 122/64  12/26/19 120/72  09/02/19 124/66   Patient checks BP at home Never Patient home BP readings are ranging: n/a  Patient has failed these meds in the past: n/a Patient is currently controlled on the following medications:  . Losartan-HCTZ 100-12.5 mg daily   We discussed diet and exercise extensively  Plan  Continue current medications   Hyperlipidemia   LDL goal <   70  Last lipids Lab Results  Component Value Date   CHOL 152 01/02/2020   HDL 48.40 01/02/2020   LDLDIRECT 61.0 01/02/2020   TRIG (H) 01/02/2020    503.0 Triglyceride is over 400; calculations on Lipids are invalid.   CHOLHDL 3 01/02/2020   Hepatic Function Latest Ref Rng & Units 01/02/2020 11/22/2018 04/16/2018  Total Protein 6.0 - 8.3 g/dL 6.6 6.4 6.2  Albumin 3.5 - 5.2 g/dL 3.9 4.0 4.0  AST 0 - 37 U/L _0 ALT 0 - 35 U/L _1 Alk Phosphatase 39 - 117 U/L 75 67 62  Total Bilirubin 0.2 - 1.2 mg/dL 0.5 0.5 0.5     The 10-year ASCVD risk score [name] DC Jr., et al., 2013) is: 24.6%   Values used to calculate the score:     Age: 39 years      Sex: Female     Is Non-Hispanic African American: No     Diabetic: Yes     Tobacco smoker: No     Systolic Blood Pressure: 568 mmHg     Is BP treated: Yes     HDL Cholesterol: 48.4 mg/dL     Total Cholesterol: 152 mg/dL   Patient has failed these meds in past: n/a Patient is currently uncontrolled on the following medications:  . Rosuvastatin 20 mg daily   We discussed:  diet and exercise extensively. Patient hopes that changes made to her carb and sweet intake will improve her triglyceride count.   Plan  Continue current medications  Recommend rechecking fasting lipid panel in 3 months to assess hypertriglyceridemia.   Diabetes   A1c goal <7%  Recent Relevant Labs: Lab Results  Component Value Date/Time   HGBA1C 11.3 (H) 01/02/2020 01:19 PM   HGBA1C 10.4 (A) 06/23/2019 01:42 PM   HGBA1C 10.3 (A) 03/24/2019 01:41 PM   HGBA1C 9.2 (H) 11/22/2018 08:14 AM   GFR 63.02 01/02/2020 01:19 PM   GFR 54.04 (L) 11/22/2018 08:14 AM   MICROALBUR <0.7 01/02/2020 01:19 PM   MICROALBUR <0.7 10/09/2017 09:51 AM    Last diabetic Eye exam:  Lab Results  Component Value Date/Time   HMDIABEYEEXA No Retinopathy 05/29/2017 10:55 AM    Last diabetic Foot exam: No results found for: HMDIABFOOTEX   Checking BG: Weekly  Recent FBG Readings: 260-270 Recent pre-meal BG readings: n/a Recent 2hr PP BG readings:  n/a Recent HS BG readings: n/a  Patient has failed these meds in past: Jardiance, Metformin XR, Pioglitazone  Patient is currently uncontrolled on the following medications: . Trulicity 4.5 mg weekly ($~200 monthly)  . Glimepiride 4 mg twice daily . Januvia 100 mg daily ($350 for 3 months)    We discussed: Patient decided to purchase Januvia and start the medication, stating she tolerates it well. Patient states that she does not believe she qualifies for assistance programs for her medications. She has been able to afford Trulicity at this time.   She has been focusing on  dietary changes to help manage her diabetes. She has gave up ice cream recently and has tried to cut back on carbs.   Patient stated she previously tolerated metformin XR well, but had intolerable GI side effects with metformin ER.   Plan  Recommend stopping Januvia due to current GLP-1 use.  Recommend starting Metformin ER 500 mg daily.  CPA Assessment in 2 weeks to ensure tolerability.  CPP follow-up in 1 month.  Hypothyroidism   Lab Results  Component Value  Date/Time   TSH 2.20 01/02/2020 01:19 PM   TSH 3.76 11/22/2018 08:14 AM   FREET4 2.46 (H) 01/02/2020 01:19 PM   FREET4 1.73 (H) 11/22/2018 08:14 AM    Patient has failed these meds in past: n/a Patient is currently controlled on the following medications:  . Levothyroxine 112 mcg daily before breakfast   We discussed:  Patient separates from breakfast by at least 30 minutes.   Plan  Continue current medications  GERD   Patient denies dysphagia, heartburn or nausea. Expresses understanding to avoid triggers such as fatty foods and Spicy Foods. States she will occasionally experience dysphagia if eating a spicy meal.   Currently controlled on: . Pantoprazole 40 mg daily  Plan   Continue current medication.  Allergic Rhinitis   Last spirometry score: n/a  Eosinophil count:   Lab Results  Component Value Date/Time   EOSPCT 2 08/27/2019 02:43 PM  %                               Eos (Absolute):  Lab Results  Component Value Date/Time   EOSABS 0.2 08/27/2019 02:43 PM    Tobacco Status:  Social History   Tobacco Use  Smoking Status Former Smoker  . Packs/day: 1.00  . Years: 11.00  . Pack years: 11.00  . Types: Cigarettes  . Start date: 1972  . Quit date: 03/20/1981  . Years since quitting: 38.8  Smokeless Tobacco Never Used   Patient has failed these meds in past: n/a Patient is currently controlled on the following medications:  . Fexofenadine 180 mg daily PRN  We discussed: Symptoms mainly  seasonal allergies. Patient never started montelukast.  Plan  Continue current medications  Recommend stopping montelukast  Depression / Anxiety   PHQ9 Score:  PHQ9 SCORE ONLY 01/17/2019 04/16/2018 10/09/2017  PHQ-9 Total Score 0 5 3   GAD7 Score: No flowsheet data found.  Patient has failed these meds in past: n/a Patient is currently controlled on the following medications:  . Alprazolam 0.5 mg QHS PRN  We discussed:  Has not needed in months.   Plan  Continue current medications  Constipation / Diarrhea   Patient has failed these meds in past: n/a Patient is currently controlled on the following medications:  . Metamucil Fiber 1 tablet daily PRN  . Docusate 50 mg daily  . Senna 8.6 mg PRN (stopped)  . Loperamide 2 mg PRN (in a couple of months)   We discussed:  Patient states she will go through cycles of constipation or diarrhea. Currently she states her constipation is well managed with docusate daily and metamucil tablets most days. She likes to keep loperamide on hand in case she does have bad diarrhea but knows not to take it in addition to her constipation medications.   Plan  Recommend discontinuing senna (patient discontinued)    Misc / OTC    . APAP 500 mg PRN  . Aspirin 81 mg daily  . Biotin 10,000 mcg daily . Estradiol 1 mg daily Patient has not started 0.5 mg tablets (20 tablets)   . Fioricet 50-325-40 mg - Migraine . Melatonin 5 mg  QHS PRN (takes for 2-3 days in a row weekly) . Vitamin B12 5000 mcg weekly   Plan  Continue current medications  Vaccines   Reviewed and discussed patient's vaccination history.    Immunization History  Administered Date(s) Administered  . Influenza, High Dose Seasonal PF 01/10/2017,   01/07/2018, 11/29/2018  . Influenza-Unspecified 12/07/2015, 12/10/2019  . PFIZER SARS-COV-2 Vaccination 04/26/2019, 05/17/2019, 12/29/2019  . Pneumococcal Conjugate-13 02/20/2017  . Pneumococcal Polysaccharide-23 01/09/2013  .  Tdap 04/21/2013  . Zoster 04/08/2015  . Zoster Recombinat (Shingrix) 10/20/2017, 12/18/2017    Medication Management   Pt uses [facility] for all medications Uses pill box? Yes Pt endorses 100% compliance  We discussed: Current pharmacy is preferred with insurance plan and patient is satisfied with pharmacy services  Plan  Continue current medication management strategy   Follow up:  2 week CPA visit 1 month phone visit  [place] Pharmacist [facility]  [phone]

## 2020-01-19 NOTE — Telephone Encounter (Signed)
Left message for patient to schedule Annual Wellness Visit.  Please schedule with Nurse Health Advisor Shannon Crews, RN at [facility]  

## 2020-01-22 ENCOUNTER — Telehealth: Payer: Self-pay

## 2020-01-22 MED ORDER — METFORMIN HCL ER 500 MG PO TB24: 500.0000 mg | ORAL_TABLET | Freq: Every day | ORAL | 3 refills | Status: DC

## 2020-01-22 NOTE — Progress Notes (Signed)
Spoke to patient per [facility] Clinical Pharmasict  -Dr. Bryan Lemma was in agreement to start Metformin ER 500 mg daily. She should be sure to take the medication with a meal to minimize the chance of stomach upset. What pharmacy would she like [place] to send the prescription to? -She can continue to take Januvia for now, but we can plan to stop that medication when she finishes her current supply.  -Continue all other medications for now.   Patient verbalized understanding, Patient pharmacy is located at [facility] - [name] [place], [place] [address].  CPA will reach out to patient in 2 weeks to see how patient is doing with her Diabetes.  [place] Pharmacist Assistant [phone]

## 2020-01-22 NOTE — Progress Notes (Signed)
Brenda Griffin,  Please reach out to patient and convey the following to her:  -Dr. Bryan Lemma was in agreement to start Metformin ER 500 mg daily. She should be sure to take the medication with a meal to minimize the chance of stomach upset. What pharmacy would she like [place] to send the prescription to? -She can continue to take Januvia for now, but we can plan to stop that medication when she finishes her current supply.  -Continue all other medications for now.   Please plan for a DM Assessment in ~2 weeks.   Thanks, [name] Section Clinical Pharmacist [place] [facility] at [facility]  [phone]

## 2020-01-22 NOTE — Addendum Note (Signed)
Addended by: Ronnald Nian on: 01/22/2020 04:58 PM   Modules accepted: Orders

## 2020-01-22 NOTE — Progress Notes (Signed)
Patient is in agreement with starting metformin at this time. Can you please send in the Rx into her listed [facility] on [name]? I will plan to check in with her in another 2 weeks to see how she is doing with the medication.   Thank you!  [name] Section Clinical Pharmacist [place] [facility] at [facility]  [phone]

## 2020-01-22 NOTE — Telephone Encounter (Signed)
-----   Message from Germaine Pomfret, [facility] sent at 01/22/2020  1:07 PM EDT -----   ----- Message ----- From: Ronnald Nian, DO Sent: 01/21/2020   4:55 PM EDT To: Germaine Pomfret, [place] yes I see your point and that is fine with me. I will start her at 500mg  daily but thing she should increase to 1000mg  daily after 2 wks. ----- Message ----- From: Germaine Pomfret, [facility] Sent: 01/21/2020   8:28 AM EDT To: Ronnald Nian, DO  Dr. Bryan Lemma,  Patient states that she decided to pick up a 3 month supply of Januvia. I think once she finishes her current supply I would recommend discontinuing it given the cost and lack of additional benefit since she is already on a GLP-1. She stated she previously tolerated Metformin XR well but her specific formulation was recalled. What would you think about having her restart metformin ER 500 mg daily?   Thanks, [name] Section Clinical Pharmacist [place] [facility] at [facility]  [phone]

## 2020-02-04 DIAGNOSIS — Z85828 Personal history of other malignant neoplasm of skin: Secondary | ICD-10-CM | POA: Diagnosis not present

## 2020-02-04 DIAGNOSIS — L821 Other seborrheic keratosis: Secondary | ICD-10-CM | POA: Diagnosis not present

## 2020-02-04 DIAGNOSIS — C44319 Basal cell carcinoma of skin of other parts of face: Secondary | ICD-10-CM | POA: Diagnosis not present

## 2020-02-05 LAB — HM DIABETES EYE EXAM

## 2020-02-06 ENCOUNTER — Encounter: Payer: Self-pay | Admitting: Family Medicine

## 2020-02-10 ENCOUNTER — Telehealth: Payer: Self-pay

## 2020-02-10 NOTE — Progress Notes (Addendum)
Chronic Care Management Pharmacy Assistant   Name: JASMYN PICHA  MRN: [id number] DOB: 04-Feb-1946  Reason for Encounter:Diabetes Disease State Call.   PCP : Ronnald Nian, DO  Allergies:   Allergies  Allergen Reactions   Bacitracin Rash   Benzoic Acid Rash   Benzyl Salicylate Rash   Formaldehyde Rash   Geraniol Rash   Hydroxyethyl Methacrylate Rash   Methyl Methacrylate [Methylmethacrylate] Rash   Other Rash    Amyl Cinnamaldehyde   Parabens Hives   Amitiza [Lubiprostone] Hives and Diarrhea   Aspirin Nausea And Vomiting   Codeine Nausea And Vomiting   Victoza [Liraglutide] Diarrhea   Butanediol [Butylene Glycol] Rash   Tape Itching and Rash    Medications: Outpatient Encounter Medications as of 02/10/2020  Medication Sig Note   acetaminophen (TYLENOL) 500 MG tablet Take 500 mg by mouth as needed.    ALPRAZolam (XANAX) 0.5 MG tablet Take 1 tablet (0.5 mg total) by mouth at bedtime as needed for anxiety.    aspirin EC 81 MG tablet Take 81 mg by mouth daily. Swallow whole.    Biotin 10000 MCG TABS Take 1 tablet by mouth daily as needed.    butalbital-acetaminophen-caffeine (FIORICET) 50-325-40 MG tablet Take 1 tablet by mouth daily as needed for migraine.     Cyanocobalamin (VITAMIN B-12) 5000 MCG TBDP Take 5,000 mcg by mouth once a week.    docusate sodium (COLACE) 50 MG capsule Take 50 mg by mouth daily.    Dulaglutide (TRULICITY) 4.5 NL/9.7QB SOPN INJECT 4.5 MG UNDER THE SKIN ONCE A WEEK AS DIRECTED    estradiol (ESTRACE) 0.5 MG tablet Take 1 tablet (0.5 mg total) by mouth daily. (Patient taking differently: Take 1 mg by mouth daily. ) 01/21/2020: Finishing current supply of 1 mg tablets.    Fexofenadine HCl (WAL-FEX ALLERGY PO) Take 1 tablet by mouth as needed.     glimepiride (AMARYL) 4 MG tablet Take 1 tablet (4 mg total) by mouth 2 (two) times daily.    levothyroxine (SYNTHROID) 112 MCG tablet Take 1 tablet (112 mcg total) by mouth daily before breakfast.     loperamide (IMODIUM A-D) 2 MG tablet Take 2 mg by mouth as needed for diarrhea or loose stools. (Patient not taking: Reported on 01/19/2020)    losartan-hydrochlorothiazide (HYZAAR) 100-12.5 MG tablet TAKE 1 TABLET BY MOUTH DAILY    melatonin 5 MG TABS Take 5 mg by mouth at bedtime as needed.    metFORMIN (GLUCOPHAGE-XR) 500 MG 24 hr tablet Take 1 tablet (500 mg total) by mouth daily with breakfast.    montelukast (SINGULAIR) 10 MG tablet TAKE 1 TABLET(10 MG) BY MOUTH AT BEDTIME    pantoprazole (PROTONIX) 40 MG tablet TAKE 1 TABLET(40 MG) BY MOUTH DAILY    psyllium (REGULOID) 0.52 g capsule Take 0.52 g by mouth daily as needed.    rosuvastatin (CRESTOR) 20 MG tablet TAKE 1 TABLET(20 MG) BY MOUTH DAILY    senna (SENOKOT) 8.6 MG tablet Take 1 tablet by mouth as needed for constipation. (Patient not taking: Reported on 01/19/2020)    sitaGLIPtin (JANUVIA) 100 MG tablet Take 100 mg by mouth daily.    No facility-administered encounter medications on file as of 02/10/2020.    Current Diagnosis: Patient Active Problem List   Diagnosis Date Noted   Astigmatism of both eyes with presbyopia 11/26/2019   Diabetes mellitus type 2 without retinopathy ([place]) 11/26/2019   Posterior vitreous detachment of left eye 11/26/2019   Vertigo 09/02/2019  Rhinitis 06/23/2019   Hypertensive retinopathy of both eyes 07/16/2018   GERD (gastroesophageal reflux disease) 04/16/2018   Diabetic polyneuropathy associated with type 2 diabetes mellitus ([place]) 04/15/2017   Hormone replacement therapy, followed by GYN 10/11/2016   Cluster headaches, rarely, prn butalbital 10/11/2016   Type 2 diabetes mellitus with other specified complication ([place]), 83/33/8329   Hypothyroidism, on Levothyroxine 10/11/2016   Hypertension associated with diabetes ([place]), Rx Hyzaar 10/11/2016   Hyperlipidemia associated with type 2 diabetes mellitus ([place]), Rx Crestor 10/11/2016   Anxiety, Rx Xanax, uses sparingly 10/11/2016   Morbid obesity  ([place]) 10/11/2016     Follow-Up:  Pharmacist Review   Recent Relevant Labs: Lab Results  Component Value Date/Time   HGBA1C 11.3 (H) 01/02/2020 01:19 PM   HGBA1C 10.4 (A) 06/23/2019 01:42 PM   HGBA1C 10.3 (A) 03/24/2019 01:41 PM   HGBA1C 9.2 (H) 11/22/2018 08:14 AM   MICROALBUR <0.7 01/02/2020 01:19 PM   MICROALBUR <0.7 10/09/2017 09:51 AM    Kidney Function Lab Results  Component Value Date/Time   CREATININE 0.91 01/02/2020 01:19 PM   CREATININE 0.92 08/27/2019 02:43 PM   CREATININE 0.84 10/09/2017 10:29 AM   GFR 63.02 01/02/2020 01:19 PM   GFRNONAA >60 08/27/2019 02:43 PM   GFRAA >60 08/27/2019 02:43 PM    Current antihyperglycemic regimen:  Trulicity 4.5 mg weekly  Glimepiride 4 mg twice daily Metformin 500 mg Daily Januvia 100 mg daily  What recent interventions/DTPs have been made to improve glycemic control:  01/22/2020 PCP Started Metformin 500 mg Daily  Patient states she is doing great with metformin , denies any side effects.Patient states she still taking Januvia but when she finish her current supply of [place] she will increase her metformin dose. Have there been any recent hospitalizations or ED visits since last visit with CPP? No Patient denies hypoglycemic symptoms, including Pale, Sweaty, Shaky, Hungry, Nervous/irritable and Vision changes Patient denies hyperglycemic symptoms, including blurry vision, excessive thirst, fatigue, polyuria and weakness How often are you checking your blood sugar? Weekly What are your blood sugars ranging?  Fasting:  On 02/07/2020 it was 180 On 01/31/2020 it was 185 On 01/24/2020 it was 193 On 01/17/2020 it was 210 Before meals: N/A After meals: N.A Bedtime: N/A During the week, how often does your blood glucose drop below 70? Never Are you checking your feet daily/regularly?   Patient reports numbness, pain and tingling sensation in her feet due to nerve damage from back surgery in the past.   Adherence Review: Is  the patient currently on a STATIN medication? Yes Is the patient currently on ACE/ARB medication? Yes Does the patient have >5 day gap between last estimated fill dates? Yes   [place] Pharmacist Assistant [phone]   Addendum 02/17/20: Fasting blood sugars uncontrolled, but tolerating metformin well so far. Will plan for discussing metformin titration in one week at CPP follow-up.   [name] Section Clinical Pharmacist [place] [facility] at [facility]  [phone]

## 2020-02-16 ENCOUNTER — Encounter: Payer: Self-pay | Admitting: Family Medicine

## 2020-02-20 ENCOUNTER — Telehealth: Payer: Self-pay

## 2020-02-20 NOTE — Progress Notes (Signed)
Spoke to patient to confirmed patient telephone appointment on 02/23/2020 for CCM at 2:00 pm with Junius Argyle the Clinical pharmacist.   [place] Pharmacist Assistant [phone]

## 2020-02-23 ENCOUNTER — Telehealth: Payer: Medicare Other

## 2020-02-25 ENCOUNTER — Other Ambulatory Visit: Payer: Self-pay | Admitting: Family Medicine

## 2020-02-26 DIAGNOSIS — C44319 Basal cell carcinoma of skin of other parts of face: Secondary | ICD-10-CM | POA: Diagnosis not present

## 2020-03-01 ENCOUNTER — Ambulatory Visit: Payer: Medicare Other

## 2020-03-01 DIAGNOSIS — I152 Hypertension secondary to endocrine disorders: Secondary | ICD-10-CM

## 2020-03-01 DIAGNOSIS — E119 Type 2 diabetes mellitus without complications: Secondary | ICD-10-CM

## 2020-03-01 NOTE — Patient Instructions (Signed)
Visit Information It was great speaking with you today!  Please let me know if you have any questions about our visit. Goals Addressed            This Visit's Progress   . Chronic Care Management       CARE PLAN ENTRY (see longitudinal plan of care for additional care plan information)  Current Barriers:  . Chronic Disease Management support, education, and care coordination needs related to  Hypertension, Hyperlipidemia, Diabetes, GERD and Hypothyroidism    Hypertension BP Readings from Last 3 Encounters:  01/08/20 122/64  12/26/19 120/72  09/02/19 124/66   . Pharmacist Clinical Goal(s): o Over the next 90 days, patient will work with PharmD and providers to maintain BP goal <130/80 . Current regimen:  o Losartan-HCTZ 100-12.5 mg daily . Interventions: o Discussed low salt diet and exercising as tolerated extensively o Will initiate blood pressure monitoring plan  . Patient self care activities - Over the next 90 days, patient will: o Check Blood Pressure 1-2 times monthly, document, and provide at future appointments o Ensure daily salt intake < 2300 mg/day  Hyperlipidemia Lab Results  Component Value Date/Time   LDLDIRECT 61.0 01/02/2020 01:19 PM   . Pharmacist Clinical Goal(s): o Over the next 90 days, patient will work with PharmD and providers to maintain LDL goal < 70 . Current regimen:  o Rosuvastatin 20 mg daily  . Interventions: o Discussed low cholesterol diet and exercising as tolerated extensively o Will initiate cholesterol monitoring plan   Diabetes Lab Results  Component Value Date/Time   HGBA1C 11.3 (H) 01/02/2020 01:19 PM   HGBA1C 10.4 (A) 06/23/2019 01:42 PM   HGBA1C 10.3 (A) 03/24/2019 01:41 PM   HGBA1C 9.2 (H) 11/22/2018 08:14 AM   . Pharmacist Clinical Goal(s): o Over the next 90 days, patient will work with PharmD and providers to achieve A1c goal <7% . Current regimen:  . Trulicity 4.5 mg weekly  . Glimepiride 4 mg twice  daily . Januvia 100 mg daily  . Metformin ER 500 mg daily  . Interventions: o Discussed carbohydrate counting and exercising as tolerated extensively o Will initiate blood sugar monitoring plan  . Patient self care activities - Over the next 90 days, patient will: o Check blood sugar once daily, document, and provide at future appointments o Contact provider with any episodes of hypoglycemia  Medication management . Pharmacist Clinical Goal(s): o Over the next 90 days, patient will work with PharmD and providers to maintain optimal medication adherence . Current pharmacy: [facility] . Interventions o Comprehensive medication review performed. o Continue current medication management strategy . Patient self care activities - Over the next 90 days, patient will: o Take medications as prescribed o Report any questions or concerns to PharmD and/or provider(s)      The patient verbalized understanding of instructions, educational materials, and care plan provided today and declined offer to receive copy of patient instructions, educational materials, and care plan.   Telephone follow up appointment with pharmacy team member scheduled for: 04/26/20 at 11:00 AM  [facility]  [phone]

## 2020-03-01 NOTE — Chronic Care Management (AMB) (Signed)
Chronic Care Management Pharmacy  Name: Brenda Griffin  MRN: [id number] DOB: 1945/06/05   Chief Complaint/ HPI  Brenda Griffin,  74 y.o. , female presents for their Follow-Up CCM visit with the clinical pharmacist via telephone.  PCP : Ronnald Nian, DO Patient Care Team: Ronnald Nian, DO as PCP - General (Family Medicine) Salvadore Dom, MD as Consulting Physician (Obstetrics and Gynecology) Mammography, [place] (Diagnostic Radiology) Bernarda Caffey, MD as Consulting Physician (Ophthalmology) Loletha Carrow Kirke Corin, MD as Consulting Physician (Gastroenterology) Germaine Pomfret, [facility] as Pharmacist (Pharmacist)  Their chronic conditions include: Hypertension, Hyperlipidemia, Diabetes, GERD and Hypothyroidism   Office Visits: 12/26/19: Patient presented to Dr. Bryan Lemma to establish care. A1c worsened to 11.3%. Glimepiride increased to 4 mg twice daily. Januvia started (patient cannot afford). Patient uninterested in starting insulin at this time.   09/02/19: Patient presented to Dr. [name] for vertigo. CT showed carotid artery stenosis. Referral placed to vascular surgery.   Consult Visit: 02/26/20: Patient presented to Dr. Rhona Raider (Dermatology) for basal cell carcinoma.  01/08/20: Patient presented to Dr. Talbert Nan (OBGYN) for follow-up. Estradiol decreased to 0.5 mg daily, Zolpidem stopped (patient no longer taking).  10/14/19: Patient presented to Dr. Rosana Hoes (Vascular Surgery) for carotid artery stenosis consult. No medication changes made.   Allergies  Allergen Reactions  . Bacitracin Rash  . Benzoic Acid Rash  . Benzyl Salicylate Rash  . Formaldehyde Rash  . Geraniol Rash  . Hydroxyethyl Methacrylate Rash  . Methyl Methacrylate [Methylmethacrylate] Rash  . Other Rash    Amyl Cinnamaldehyde  . Parabens Hives  . Amitiza [Lubiprostone] Hives and Diarrhea  . Aspirin Nausea And Vomiting  . Codeine Nausea And Vomiting  . Victoza [Liraglutide] Diarrhea  .  Butanediol [Butylene Glycol] Rash  . Tape Itching and Rash    Medications: Outpatient Encounter Medications as of 03/01/2020  Medication Sig Note  . acetaminophen (TYLENOL) 500 MG tablet Take 500 mg by mouth as needed.   . ALPRAZolam (XANAX) 0.5 MG tablet Take 1 tablet (0.5 mg total) by mouth at bedtime as needed for anxiety.   Marland Kitchen aspirin EC 81 MG tablet Take 81 mg by mouth daily. Swallow whole.   . Biotin 10000 MCG TABS Take 1 tablet by mouth daily as needed.   . butalbital-acetaminophen-caffeine (FIORICET) 50-325-40 MG tablet Take 1 tablet by mouth daily as needed for migraine.    . Cyanocobalamin (VITAMIN B-12) 5000 MCG TBDP Take 5,000 mcg by mouth once a week.   . docusate sodium (COLACE) 50 MG capsule Take 50 mg by mouth daily.   . Dulaglutide (TRULICITY) 4.5 HW/2.9HB SOPN INJECT 4.5 MG UNDER THE SKIN ONCE A WEEK AS DIRECTED   . estradiol (ESTRACE) 0.5 MG tablet Take 1 tablet (0.5 mg total) by mouth daily. (Patient taking differently: Take 1 mg by mouth daily. ) 01/21/2020: Finishing current supply of 1 mg tablets.   . Fexofenadine HCl (WAL-FEX ALLERGY PO) Take 1 tablet by mouth as needed.    Marland Kitchen glimepiride (AMARYL) 4 MG tablet Take 1 tablet (4 mg total) by mouth 2 (two) times daily.   Marland Kitchen levothyroxine (SYNTHROID) 112 MCG tablet Take 1 tablet (112 mcg total) by mouth daily before breakfast.   . loperamide (IMODIUM A-D) 2 MG tablet Take 2 mg by mouth as needed for diarrhea or loose stools. (Patient not taking: Reported on 01/19/2020)   . losartan-hydrochlorothiazide (HYZAAR) 100-12.5 MG tablet TAKE 1 TABLET BY MOUTH DAILY   . melatonin 5 MG TABS Take  5 mg by mouth at bedtime as needed.   . metFORMIN (GLUCOPHAGE-XR) 500 MG 24 hr tablet Take 1 tablet (500 mg total) by mouth daily with breakfast.   . montelukast (SINGULAIR) 10 MG tablet TAKE 1 TABLET(10 MG) BY MOUTH AT BEDTIME   . pantoprazole (PROTONIX) 40 MG tablet TAKE 1 TABLET(40 MG) BY MOUTH DAILY   . psyllium (REGULOID) 0.52 g capsule Take  0.52 g by mouth daily as needed.   . rosuvastatin (CRESTOR) 20 MG tablet TAKE 1 TABLET(20 MG) BY MOUTH DAILY   . senna (SENOKOT) 8.6 MG tablet Take 1 tablet by mouth as needed for constipation. (Patient not taking: Reported on 01/19/2020)   . sitaGLIPtin (JANUVIA) 100 MG tablet Take 100 mg by mouth daily.    No facility-administered encounter medications on file as of 03/01/2020.    Wt Readings from Last 3 Encounters:  01/08/20 208 lb (94.3 kg)  12/26/19 209 lb (94.8 kg)  09/02/19 211 lb 3.2 oz (95.8 kg)    Current Diagnosis/Assessment:  SDOH Interventions   Flowsheet Row Most Recent Value  SDOH Interventions   Financial Strain Interventions Patient Refused  Transportation Interventions Intervention Not Indicated      Goals Addressed            This Visit's Progress   . Chronic Care Management       CARE PLAN ENTRY (see longitudinal plan of care for additional care plan information)  Current Barriers:  . Chronic Disease Management support, education, and care coordination needs related to  Hypertension, Hyperlipidemia, Diabetes, GERD and Hypothyroidism    Hypertension BP Readings from Last 3 Encounters:  01/08/20 122/64  12/26/19 120/72  09/02/19 124/66   . Pharmacist Clinical Goal(s): o Over the next 90 days, patient will work with PharmD and providers to maintain BP goal <130/80 . Current regimen:  o Losartan-HCTZ 100-12.5 mg daily . Interventions: o Discussed low salt diet and exercising as tolerated extensively o Will initiate blood pressure monitoring plan  . Patient self care activities - Over the next 90 days, patient will: o Check Blood Pressure 1-2 times monthly, document, and provide at future appointments o Ensure daily salt intake < 2300 mg/day  Hyperlipidemia Lab Results  Component Value Date/Time   LDLDIRECT 61.0 01/02/2020 01:19 PM   . Pharmacist Clinical Goal(s): o Over the next 90 days, patient will work with PharmD and providers to maintain  LDL goal < 70 . Current regimen:  o Rosuvastatin 20 mg daily  . Interventions: o Discussed low cholesterol diet and exercising as tolerated extensively o Will initiate cholesterol monitoring plan   Diabetes Lab Results  Component Value Date/Time   HGBA1C 11.3 (H) 01/02/2020 01:19 PM   HGBA1C 10.4 (A) 06/23/2019 01:42 PM   HGBA1C 10.3 (A) 03/24/2019 01:41 PM   HGBA1C 9.2 (H) 11/22/2018 08:14 AM   . Pharmacist Clinical Goal(s): o Over the next 90 days, patient will work with PharmD and providers to achieve A1c goal <7% . Current regimen:  . Trulicity 4.5 mg weekly  . Glimepiride 4 mg twice daily . Januvia 100 mg daily  . Metformin ER 500 mg daily  . Interventions: o Discussed carbohydrate counting and exercising as tolerated extensively o Will initiate blood sugar monitoring plan  . Patient self care activities - Over the next 90 days, patient will: o Check blood sugar once daily, document, and provide at future appointments o Contact provider with any episodes of hypoglycemia  Medication management . Pharmacist Clinical Goal(s): o Over  the next 90 days, patient will work with PharmD and providers to maintain optimal medication adherence . Current pharmacy: [facility] . Interventions o Comprehensive medication review performed. o Continue current medication management strategy . Patient self care activities - Over the next 90 days, patient will: o Take medications as prescribed o Report any questions or concerns to PharmD and/or provider(s)      Hypertension   BP goal is:  <130/80  Office blood pressures are  BP Readings from Last 3 Encounters:  01/08/20 122/64  12/26/19 120/72  09/02/19 124/66   Patient checks BP at home Never Patient home BP readings are ranging: n/a  Patient has failed these meds in the past: n/a Patient is currently controlled on the following medications:  . Losartan-HCTZ 100-12.5 mg daily   We discussed diet and exercise extensively. Does  not routinely monitor blood pressure at home, but is stable and no concerns with her regimen at this time.   Plan  Continue current medications   Hyperlipidemia   LDL goal < 70  Last lipids Lab Results  Component Value Date   CHOL 152 01/02/2020   HDL 48.40 01/02/2020   LDLDIRECT 61.0 01/02/2020   TRIG (H) 01/02/2020    503.0 Triglyceride is over 400; calculations on Lipids are invalid.   CHOLHDL 3 01/02/2020   Hepatic Function Latest Ref Rng & Units 01/02/2020 11/22/2018 04/16/2018  Total Protein 6.0 - 8.3 g/dL 6.6 6.4 6.2  Albumin 3.5 - 5.2 g/dL 3.9 4.0 4.0  AST 0 - 37 U/L 22 24 19   ALT 0 - 35 U/L 19 23 15   Alk Phosphatase 39 - 117 U/L 75 67 62  Total Bilirubin 0.2 - 1.2 mg/dL 0.5 0.5 0.5     The 10-year ASCVD risk score [name] DC Jr., et al., 2013) is: 31.1%   Values used to calculate the score:     Age: 33 years     Sex: Female     Is Non-Hispanic African American: No     Diabetic: Yes     Tobacco smoker: No     Systolic Blood Pressure: 109 mmHg     Is BP treated: Yes     HDL Cholesterol: 48.4 mg/dL     Total Cholesterol: 152 mg/dL   Patient has failed these meds in past: n/a Patient is currently uncontrolled on the following medications:  . Rosuvastatin 20 mg daily   We discussed:  diet and exercise extensively. Patient hopes that changes made to her carb and sweet intake will improve her triglyceride count.   Plan  Continue current medications  Recommend rechecking fasting lipid panel in 2 months to assess hypertriglyceridemia.   Diabetes   A1c goal <7%  Recent Relevant Labs: Lab Results  Component Value Date/Time   HGBA1C 11.3 (H) 01/02/2020 01:19 PM   HGBA1C 10.4 (A) 06/23/2019 01:42 PM   HGBA1C 10.3 (A) 03/24/2019 01:41 PM   HGBA1C 9.2 (H) 11/22/2018 08:14 AM   GFR 63.02 01/02/2020 01:19 PM   GFR 54.04 (L) 11/22/2018 08:14 AM   MICROALBUR <0.7 01/02/2020 01:19 PM   MICROALBUR <0.7 10/09/2017 09:51 AM    Last diabetic Eye exam:  Lab Results   Component Value Date/Time   HMDIABEYEEXA No Retinopathy 02/05/2020 12:00 AM    Last diabetic Foot exam: No results found for: HMDIABFOOTEX   Checking BG: Weekly  On 02/28/20 it was 172   On 02/21/20 it was 183   On 02/07/2020 it was 180  On 01/31/2020 it was  185  On 01/24/2020 it was 193  On 01/17/2020 it was 210  Patient has failed these meds in past: Jardiance, Metformin XR, Pioglitazone  Patient is currently uncontrolled on the following medications: . Trulicity 4.5 mg weekly ($~200 monthly)  . Glimepiride 4 mg twice daily . Januvia 100 mg daily    . Metformin ER 500 mg daily   We discussed: Patient tolerating metformin and Januvia well. Discussed stopping Januvia when she finishes her current supply in Jan.   She has been focusing on dietary changes to help manage her diabetes. She has gave up ice cream recently and has tried to cut back on carbs. We discussed carbohydrate counting, limiting meals to 45-60 grams of carbohydrates, and the PLATE Method   Patient stated she previously tolerated metformin XR well, but had intolerable GI side effects with metformin.   Plan  Recommend increasing metformin ER to 500 mg twice daily CPA Assessment in 2 weeks to ensure tolerability.   Hypothyroidism   Lab Results  Component Value Date/Time   TSH 2.20 01/02/2020 01:19 PM   TSH 3.76 11/22/2018 08:14 AM   FREET4 2.46 (H) 01/02/2020 01:19 PM   FREET4 1.73 (H) 11/22/2018 08:14 AM    Patient has failed these meds in past: n/a Patient is currently controlled on the following medications:  . Levothyroxine 112 mcg daily before breakfast   We discussed:  Patient separates from breakfast by at least 30 minutes.   Plan  Continue current medications  GERD   Patient denies dysphagia, heartburn or nausea. Expresses understanding to avoid triggers such as fatty foods and Spicy Foods. States she will occasionally experience dysphagia if eating a spicy meal.   Currently  controlled on: . Pantoprazole 40 mg daily  Plan   Continue current medication.  Allergic Rhinitis   Last spirometry score: n/a  Eosinophil count:   Lab Results  Component Value Date/Time   EOSPCT 2 08/27/2019 02:43 PM  %                               Eos (Absolute):  Lab Results  Component Value Date/Time   EOSABS 0.2 08/27/2019 02:43 PM    Tobacco Status:  Social History   Tobacco Use  Smoking Status Former Smoker  . Packs/day: 1.00  . Years: 11.00  . Pack years: 11.00  . Types: Cigarettes  . Start date: 28  . Quit date: 03/20/1981  . Years since quitting: 38.9  Smokeless Tobacco Never Used   Patient has failed these meds in past: n/a Patient is currently controlled on the following medications:  . Fexofenadine 180 mg daily PRN  We discussed: Symptoms mainly seasonal allergies. Patient never started montelukast.  Plan  Continue current medications  Recommend stopping montelukast  Depression / Anxiety   PHQ9 Score:  PHQ9 SCORE ONLY 01/17/2019 04/16/2018 10/09/2017  PHQ-9 Total Score 0 5 3   GAD7 Score: No flowsheet data found.  Patient has failed these meds in past: n/a Patient is currently controlled on the following medications:  . Alprazolam 0.5 mg QHS PRN  We discussed:  Has not needed in months.   Plan  Continue current medications  Constipation / Diarrhea   Patient has failed these meds in past: n/a Patient is currently controlled on the following medications:  Marland Kitchen Metamucil Fiber 1 tablet daily PRN  . Docusate 50 mg daily  . Senna 8.6 mg PRN (stopped)  . Loperamide 2 mg  PRN (in a couple of months)   We discussed:  Patient states she will go through cycles of constipation or diarrhea. Currently she states her constipation is well managed with docusate daily and metamucil tablets most days. She likes to keep loperamide on hand in case she does have bad diarrhea but knows not to take it in addition to her constipation medications.    Plan  Recommend discontinuing senna (patient discontinued)    Misc / OTC    . APAP 500 mg PRN  . Aspirin 81 mg daily  . Biotin 10,000 mcg daily . Estradiol 1 mg daily Patient has not started 0.5 mg tablets (20 tablets)   . Fioricet 50-325-40 mg - Migraine . Melatonin 5 mg  QHS PRN (takes for 2-3 days in a row weekly) . Vitamin B12 5000 mcg weekly   Plan  Continue current medications  Vaccines   Reviewed and discussed patient's vaccination history.    Immunization History  Administered Date(s) Administered  . Influenza, High Dose Seasonal PF 01/10/2017, 01/07/2018, 11/29/2018  . Influenza-Unspecified 12/07/2015, 12/10/2019  . PFIZER SARS-COV-2 Vaccination 04/26/2019, 05/17/2019, 12/29/2019  . Pneumococcal Conjugate-13 02/20/2017  . Pneumococcal Polysaccharide-23 01/09/2013  . Tdap 04/21/2013  . Zoster 04/08/2015  . Zoster Recombinat (Shingrix) 10/20/2017, 12/18/2017    Medication Management   Pt uses [facility] for all medications Uses pill box? Yes Pt endorses 100% compliance  We discussed: Current pharmacy is preferred with insurance plan and patient is satisfied with pharmacy services  Plan  Continue current medication management strategy  Follow up:  2 month phone visit  [name] Section Clinical Pharmacist [facility] [place]  [phone]

## 2020-03-03 ENCOUNTER — Other Ambulatory Visit: Payer: Self-pay | Admitting: Family Medicine

## 2020-03-03 DIAGNOSIS — E119 Type 2 diabetes mellitus without complications: Secondary | ICD-10-CM

## 2020-03-03 MED ORDER — METFORMIN HCL ER 500 MG PO TB24: 500.0000 mg | ORAL_TABLET | Freq: Two times a day (BID) | ORAL | 3 refills | Status: DC

## 2020-03-04 ENCOUNTER — Telehealth: Payer: Self-pay

## 2020-03-04 NOTE — Telephone Encounter (Signed)
-----   Message from Germaine Pomfret, [facility] sent at 03/04/2020  1:48 PM EST -----   ----- Message ----- From: Ronnald Nian, DO Sent: 03/03/2020   5:28 PM EST To: Germaine Pomfret, [place]  Yes I will send that now.  Thank you!  ----- Message ----- From: Germaine Pomfret, [facility] Sent: 03/01/2020  11:28 AM EST To: Ronnald Nian, DO  Dr. Bryan Lemma,  Patient is tolerating Metformin ER well, but her FBG are still elevated. Can we increase her metformin ER to 500 mg twice daily? Can you send in the new Rx for her?   Thanks, [name] Section Clinical Pharmacist [facility]  [phone]

## 2020-03-04 NOTE — Progress Notes (Signed)
Spoke with patient to  inform her that Dr. Cathleen Corti was in agreement with clinical pharmacist about increasing her metformin to twice daily and a new Rx was sent to the pharmacy for her to pick up.   Patient verbalized understanding. Patient states she has already pick up her metformin from her pharmacy.  [place] Pharmacist Assistant [phone]

## 2020-03-04 NOTE — Progress Notes (Signed)
Brenda Griffin,  Please reach out to [facility] and inform her that Dr. Cathleen Corti was in agreement about increasing her metformin to twice daily and she sent in a new Rx to the pharmacy for her to pick up.   Thanks, [name] Section Clinical Pharmacist [place] [facility]  [phone]

## 2020-03-26 ENCOUNTER — Telehealth: Payer: Self-pay | Admitting: Family Medicine

## 2020-03-26 NOTE — Telephone Encounter (Signed)
Left message for patient to schedule Annual Wellness Visit.  Please schedule with Nurse Health Advisor Martha Stanley, RN at [place]  °

## 2020-03-30 ENCOUNTER — Telehealth: Payer: Self-pay

## 2020-03-30 NOTE — Progress Notes (Signed)
Chronic Care Management Pharmacy Assistant   Name: Brenda Griffin  MRN: [id number] DOB: 06-16-1945  Reason for Encounter:Diabetes  Disease State Call.  PCP : Ronnald Nian, DO  Allergies:   Allergies  Allergen Reactions  . Bacitracin Rash  . Benzoic Acid Rash  . Benzyl Salicylate Rash  . Formaldehyde Rash  . Geraniol Rash  . Hydroxyethyl Methacrylate Rash  . Methyl Methacrylate [Methylmethacrylate] Rash  . Other Rash    Amyl Cinnamaldehyde  . Parabens Hives  . Amitiza [Lubiprostone] Hives and Diarrhea  . Aspirin Nausea And Vomiting  . Codeine Nausea And Vomiting  . Victoza [Liraglutide] Diarrhea  . Butanediol [Butylene Glycol] Rash  . Tape Itching and Rash    Medications: Outpatient Encounter Medications as of 03/30/2020  Medication Sig Note  . acetaminophen (TYLENOL) 500 MG tablet Take 500 mg by mouth as needed.   . ALPRAZolam (XANAX) 0.5 MG tablet Take 1 tablet (0.5 mg total) by mouth at bedtime as needed for anxiety.   Marland Kitchen aspirin EC 81 MG tablet Take 81 mg by mouth daily. Swallow whole.   . Biotin 10000 MCG TABS Take 1 tablet by mouth daily as needed.   . Cyanocobalamin (VITAMIN B-12) 5000 MCG TBDP Take 5,000 mcg by mouth once a week.   . docusate sodium (COLACE) 50 MG capsule Take 50 mg by mouth daily.   . Dulaglutide (TRULICITY) 4.5 SH/7.73YO SOPN INJECT 4.5 MG UNDER THE SKIN ONCE A WEEK AS DIRECTED   . estradiol (ESTRACE) 0.5 MG tablet Take 1 tablet (0.5 mg total) by mouth daily. (Patient taking differently: Take 1 mg by mouth daily.) 01/21/2020: Finishing current supply of 1 mg tablets.   . Fexofenadine HCl (WAL-FEX ALLERGY PO) Take 1 tablet by mouth as needed.    Marland Kitchen glimepiride (AMARYL) 4 MG tablet Take 1 tablet (4 mg total) by mouth 2 (two) times daily.   Marland Kitchen levothyroxine (SYNTHROID) 112 MCG tablet Take 1 tablet (112 mcg total) by mouth daily before breakfast.   . losartan-hydrochlorothiazide (HYZAAR) 100-12.5 MG tablet TAKE 1 TABLET BY MOUTH DAILY   .  melatonin 5 MG TABS Take 5 mg by mouth at bedtime as needed.   . pantoprazole (PROTONIX) 40 MG tablet TAKE 1 TABLET(40 MG) BY MOUTH DAILY   . rosuvastatin (CRESTOR) 20 MG tablet TAKE 1 TABLET(20 MG) BY MOUTH DAILY   . sitaGLIPtin (JANUVIA) 100 MG tablet Take 100 mg by mouth daily.   . [DISCONTINUED] butalbital-acetaminophen-caffeine (FIORICET) 50-325-40 MG tablet Take 1 tablet by mouth daily as needed for migraine.    . [DISCONTINUED] loperamide (IMODIUM A-D) 2 MG tablet Take 2 mg by mouth as needed for diarrhea or loose stools. (Patient not taking: Reported on 01/19/2020)   . [DISCONTINUED] metFORMIN (GLUCOPHAGE-XR) 500 MG 24 hr tablet Take 1 tablet (500 mg total) by mouth in the morning and at bedtime.   . [DISCONTINUED] montelukast (SINGULAIR) 10 MG tablet TAKE 1 TABLET(10 MG) BY MOUTH AT BEDTIME   . [DISCONTINUED] psyllium (REGULOID) 0.52 g capsule Take 0.52 g by mouth daily as needed.   . [DISCONTINUED] senna (SENOKOT) 8.6 MG tablet Take 1 tablet by mouth as needed for constipation. (Patient not taking: Reported on 01/19/2020)    No facility-administered encounter medications on file as of 03/30/2020.    Current Diagnosis: Patient Active Problem List   Diagnosis Date Noted  . Astigmatism of both eyes with presbyopia 11/26/2019  . Diabetes mellitus type 2 without retinopathy ([place]) 11/26/2019  . Posterior vitreous detachment  of left eye 11/26/2019  . Vertigo 09/02/2019  . Rhinitis 06/23/2019  . Hypertensive retinopathy of both eyes 07/16/2018  . GERD (gastroesophageal reflux disease) 04/16/2018  . Diabetic polyneuropathy associated with type 2 diabetes mellitus ([place]) 04/15/2017  . Hormone replacement therapy, followed by GYN 10/11/2016  . Cluster headaches, rarely, prn butalbital 10/11/2016  . Type 2 diabetes mellitus with other specified complication ([place]), 27/08/2374  . Hypothyroidism, on Levothyroxine 10/11/2016  . Hypertension associated with diabetes ([place]), Rx Hyzaar 10/11/2016  .  Hyperlipidemia associated with type 2 diabetes mellitus ([place]), Rx Crestor 10/11/2016  . Anxiety, Rx Xanax, uses sparingly 10/11/2016  . Morbid obesity ([place]) 10/11/2016    Goals Addressed   None    Recent Relevant Labs: Lab Results  Component Value Date/Time   HGBA1C 9.7 (A) 04/01/2020 02:26 PM   HGBA1C 11.3 (H) 01/02/2020 01:19 PM   HGBA1C 10.4 (A) 06/23/2019 01:42 PM   HGBA1C 9.2 (H) 11/22/2018 08:14 AM   MICROALBUR <0.7 01/02/2020 01:19 PM   MICROALBUR <0.7 10/09/2017 09:51 AM    Kidney Function Lab Results  Component Value Date/Time   CREATININE 0.91 01/02/2020 01:19 PM   CREATININE 0.92 08/27/2019 02:43 PM   CREATININE 0.84 10/09/2017 10:29 AM   GFR 63.02 01/02/2020 01:19 PM   GFRNONAA >60 08/27/2019 02:43 PM   GFRAA >60 08/27/2019 02:43 PM    . Current antihyperglycemic regimen:   Trulicity 4.5 mg weekly   Glimepiride 4 mg twice daily  Januvia 100 mg daily   Metformin ER Three tablet with breakfast daily  . What recent interventions/DTPs have been made to improve glycemic control:  o 03/04/2020 PCP increase her metformin ER to 500 mg twice daily o 04/01/2020 PCP Increase metformin 500 mg three tablet  daily with Breakfast  . Have there been any recent hospitalizations or ED visits since last visit with CPP? No . Patient denies hypoglycemic symptoms, including Pale, Sweaty, Shaky, Hungry, Nervous/irritable and Vision changes . Patient denies hyperglycemic symptoms, including blurry vision, excessive thirst, fatigue, polyuria and weakness . How often are you checking your blood sugar? once daily . What are your blood sugars ranging?  o Fasting: Patient states blood sugar are ranging around 160's-170's o Before meals: N/A o After meals: N/A o Bedtime: N/A . During the week, how often does your blood glucose drop below 70? Never . Are you checking your feet daily/regularly?    Patient reports numbness, pain and tingling sensation in her feet due to nerve damage  from back surgery in the past.   Adherence Review: Is the patient currently on a STATIN medication? Yes Is the patient currently on ACE/ARB medication? Yes Does the patient have >5 day gap between last estimated fill dates? Yes    Maryjean Ka  Follow-Up:  Pharmacist Review   [facility] [place] Clinical Pharmacist Assistant [phone]

## 2020-04-01 ENCOUNTER — Other Ambulatory Visit: Payer: Self-pay

## 2020-04-01 ENCOUNTER — Ambulatory Visit (INDEPENDENT_AMBULATORY_CARE_PROVIDER_SITE_OTHER): Payer: Medicare Other | Admitting: Family Medicine

## 2020-04-01 ENCOUNTER — Encounter: Payer: Self-pay | Admitting: Family Medicine

## 2020-04-01 VITALS — BP 124/72 | HR 95 | Temp 97.0°F | Ht 66.0 in | Wt 201.4 lb

## 2020-04-01 DIAGNOSIS — E1159 Type 2 diabetes mellitus with other circulatory complications: Secondary | ICD-10-CM | POA: Diagnosis not present

## 2020-04-01 DIAGNOSIS — M7581 Other shoulder lesions, right shoulder: Secondary | ICD-10-CM | POA: Diagnosis not present

## 2020-04-01 DIAGNOSIS — K5900 Constipation, unspecified: Secondary | ICD-10-CM | POA: Diagnosis not present

## 2020-04-01 DIAGNOSIS — I152 Hypertension secondary to endocrine disorders: Secondary | ICD-10-CM | POA: Diagnosis not present

## 2020-04-01 DIAGNOSIS — E039 Hypothyroidism, unspecified: Secondary | ICD-10-CM

## 2020-04-01 DIAGNOSIS — E1169 Type 2 diabetes mellitus with other specified complication: Secondary | ICD-10-CM

## 2020-04-01 DIAGNOSIS — E785 Hyperlipidemia, unspecified: Secondary | ICD-10-CM

## 2020-04-01 DIAGNOSIS — E119 Type 2 diabetes mellitus without complications: Secondary | ICD-10-CM

## 2020-04-01 LAB — POCT GLYCOSYLATED HEMOGLOBIN (HGB A1C): Hemoglobin A1C: 9.7 % — AB (ref 4.0–5.6)

## 2020-04-01 MED ORDER — PREDNISONE 20 MG PO TABS: ORAL_TABLET | ORAL | 0 refills | Status: DC

## 2020-04-01 MED ORDER — METFORMIN HCL ER 500 MG PO TB24: 1500.0000 mg | ORAL_TABLET | Freq: Every day | ORAL | 3 refills | Status: DC

## 2020-04-01 NOTE — Progress Notes (Signed)
Brenda Griffin is a 75 y.o. female  Chief Complaint  Patient presents with  . Follow-up    3 month f/u,  wants to increase her metformin to 3 daily.  Also states that her RT shoulder had been hurting since before Christmas.  She has taken Tylenol with some relief.      HPI: Brenda Griffin is a 74 y.o. female seen today for routine f/u on chronic medical issues including DM, HTN, hyperlipidemia, hypothyroidism. For her DM, pt is taking metformin XR 500mg  BID, glimepiride 4mg  BID, januiva 100mg , trulicity 4.5mg  weekly. Home FBS 160-170. She would like to increase her metformin to 3x/day. For her HLD, she is taking crestor 20mg  daily.  For her HTN, pt is taking  hyzaar 100-12.5mg  daily. For her hypothyroidism, she is taking levothyroxine 161mcg daily.   Pt has lost 7lbs since last OV in 12/2019. She is eating smaller portions.   She complains of Rt shoulder pain since before Christmas. Pt states she was reaching behind with her Rt arm and felt a "pop". Pain since that time. Minimal improvement with tylenol. No numbness or tingling. No weakness. Decreased ROM.     Lab Results  Component Value Date   HGBA1C 11.3 (H) 01/02/2020   Lab Results  Component Value Date   CHOL 152 01/02/2020   HDL 48.40 01/02/2020   LDLDIRECT 61.0 01/02/2020   TRIG (H) 01/02/2020    503.0 Triglyceride is over 400; calculations on Lipids are invalid.   CHOLHDL 3 01/02/2020   Lab Results  Component Value Date   NA 131 (L) 01/02/2020   K 4.2 01/02/2020   CREATININE 0.91 01/02/2020   GFRNONAA >60 08/27/2019   GFRAA >60 08/27/2019   GLUCOSE 424 (H) 01/02/2020   Lab Results  Component Value Date   MICROALBUR <0.7 01/02/2020   MICROALBUR <0.7 10/09/2017   Lab Results  Component Value Date   ALT 19 01/02/2020   AST 22 01/02/2020   ALKPHOS 75 01/02/2020   BILITOT 0.5 01/02/2020   Last thyroid functions Lab Results  Component Value Date   TSH 2.20 01/02/2020    Past Medical History:  Diagnosis  Date  . Anxiety   . Basal cell carcinoma   . Cataract   . Diabetes mellitus without complication ([place])   . Fibroid   . Gallstones   . GERD (gastroesophageal reflux disease)   . Heart murmur   . Hypercholesterolemia   . Hypertension   . IBS (irritable bowel syndrome)   . Infertility, female   . Pneumonia   . Skin cancer   . Thyroid disease     Past Surgical History:  Procedure Laterality Date  . ABDOMINAL HYSTERECTOMY  1997   oophorectomy  . BUNIONECTOMY Bilateral 1997, 2001   right 1997, left 2001  . CATARACT EXTRACTION Bilateral 2006  . CERVICAL CONE BIOPSY  1976  . EYE SURGERY    . GALLBLADDER SURGERY    . LUMBAR DISC SURGERY    . MOHS SURGERY  2005/2007  . PILONIDAL CYST EXCISION  1975  . TONSILLECTOMY  1957    Social History   Socioeconomic History  . Marital status: Married    Spouse name: Not on file  . Number of children: 0  . Years of education: Not on file  . Highest education level: Not on file  Occupational History  . Occupation: retired  Tobacco Use  . Smoking status: Former Smoker    Packs/day: 1.00    Years: 11.00  Pack years: 11.00    Types: Cigarettes    Start date: 38    Quit date: 03/20/1981    Years since quitting: 39.0  . Smokeless tobacco: Never Used  Vaping Use  . Vaping Use: Never used  Substance and Sexual Activity  . Alcohol use: Yes    Comment: socially  . Drug use: No  . Sexual activity: Yes    Partners: Male    Birth control/protection: Post-menopausal  Other Topics Concern  . Not on file  Social History Narrative  . Not on file   Social Determinants of Health   Financial Resource Strain: Medium Risk  . Difficulty of Paying Living Expenses: Somewhat hard  Food Insecurity: Not on file  Transportation Needs: No Transportation Needs  . Lack of Transportation (Medical): No  . Lack of Transportation (Non-Medical): No  Physical Activity: Not on file  Stress: Not on file  Social Connections: Not on file  Intimate  Partner Violence: Not on file    Family History  Problem Relation Age of Onset  . Stroke Mother   . Heart disease Father   . Hyperlipidemia Father   . Hypertension Father   . Hypertension Sister   . Heart disease Paternal Grandmother   . Hypertension Paternal Grandmother   . Crohn's disease Brother   . Diabetes Brother   . Hypertension Brother   . Diabetes Brother   . Colon cancer Maternal Great-grandmother      Immunization History  Administered Date(s) Administered  . Influenza, High Dose Seasonal PF 01/10/2017, 01/07/2018, 11/29/2018  . Influenza-Unspecified 12/07/2015, 12/10/2019  . PFIZER SARS-COV-2 Vaccination 04/26/2019, 05/17/2019, 12/29/2019  . Pneumococcal Conjugate-13 02/20/2017  . Pneumococcal Polysaccharide-23 01/09/2013  . Tdap 04/21/2013  . Zoster 04/08/2015  . Zoster Recombinat (Shingrix) 10/20/2017, 12/18/2017    Outpatient Encounter Medications as of 04/01/2020  Medication Sig Note  . acetaminophen (TYLENOL) 500 MG tablet Take 500 mg by mouth as needed.   . ALPRAZolam (XANAX) 0.5 MG tablet Take 1 tablet (0.5 mg total) by mouth at bedtime as needed for anxiety.   Marland Kitchen aspirin EC 81 MG tablet Take 81 mg by mouth daily. Swallow whole.   . Biotin 10000 MCG TABS Take 1 tablet by mouth daily as needed.   . Cyanocobalamin (VITAMIN B-12) 5000 MCG TBDP Take 5,000 mcg by mouth once a week.   . docusate sodium (COLACE) 50 MG capsule Take 50 mg by mouth daily.   . Dulaglutide (TRULICITY) 4.5 0000000 SOPN INJECT 4.5 MG UNDER THE SKIN ONCE A WEEK AS DIRECTED   . estradiol (ESTRACE) 0.5 MG tablet Take 1 tablet (0.5 mg total) by mouth daily. (Patient taking differently: Take 1 mg by mouth daily.) 01/21/2020: Finishing current supply of 1 mg tablets.   . Fexofenadine HCl (WAL-FEX ALLERGY PO) Take 1 tablet by mouth as needed.    Marland Kitchen glimepiride (AMARYL) 4 MG tablet Take 1 tablet (4 mg total) by mouth 2 (two) times daily.   Marland Kitchen levothyroxine (SYNTHROID) 112 MCG tablet Take 1 tablet  (112 mcg total) by mouth daily before breakfast.   . losartan-hydrochlorothiazide (HYZAAR) 100-12.5 MG tablet TAKE 1 TABLET BY MOUTH DAILY   . melatonin 5 MG TABS Take 5 mg by mouth at bedtime as needed.   . metFORMIN (GLUCOPHAGE-XR) 500 MG 24 hr tablet Take 1 tablet (500 mg total) by mouth in the morning and at bedtime.   . rosuvastatin (CRESTOR) 20 MG tablet TAKE 1 TABLET(20 MG) BY MOUTH DAILY   . sitaGLIPtin (JANUVIA)  100 MG tablet Take 100 mg by mouth daily.   . pantoprazole (PROTONIX) 40 MG tablet TAKE 1 TABLET(40 MG) BY MOUTH DAILY   . [DISCONTINUED] butalbital-acetaminophen-caffeine (FIORICET) 50-325-40 MG tablet Take 1 tablet by mouth daily as needed for migraine.    . [DISCONTINUED] loperamide (IMODIUM A-D) 2 MG tablet Take 2 mg by mouth as needed for diarrhea or loose stools. (Patient not taking: Reported on 01/19/2020)   . [DISCONTINUED] montelukast (SINGULAIR) 10 MG tablet TAKE 1 TABLET(10 MG) BY MOUTH AT BEDTIME   . [DISCONTINUED] psyllium (REGULOID) 0.52 g capsule Take 0.52 g by mouth daily as needed.   . [DISCONTINUED] senna (SENOKOT) 8.6 MG tablet Take 1 tablet by mouth as needed for constipation. (Patient not taking: Reported on 01/19/2020)    No facility-administered encounter medications on file as of 04/01/2020.     ROS: Pertinent positives and negatives noted in HPI. Remainder of ROS non-contributory   Allergies  Allergen Reactions  . Bacitracin Rash  . Benzoic Acid Rash  . Benzyl Salicylate Rash  . Formaldehyde Rash  . Geraniol Rash  . Hydroxyethyl Methacrylate Rash  . Methyl Methacrylate [Methylmethacrylate] Rash  . Other Rash    Amyl Cinnamaldehyde  . Parabens Hives  . Amitiza [Lubiprostone] Hives and Diarrhea  . Aspirin Nausea And Vomiting  . Codeine Nausea And Vomiting  . Victoza [Liraglutide] Diarrhea  . Butanediol [Butylene Glycol] Rash  . Tape Itching and Rash    BP 124/72   Pulse 95   Temp (!) 97 F (36.1 C) (Temporal)   Ht 5\' 6"  (1.676 m)   Wt  201 lb 6.4 oz (91.4 kg)   SpO2 98%   BMI 32.51 kg/m    BP Readings from Last 3 Encounters:  04/01/20 124/72  01/08/20 122/64  12/26/19 120/72   Pulse Readings from Last 3 Encounters:  04/01/20 95  01/08/20 (!) 105  12/26/19 (!) 106   Wt Readings from Last 3 Encounters:  04/01/20 201 lb 6.4 oz (91.4 kg)  01/08/20 208 lb (94.3 kg)  12/26/19 209 lb (94.8 kg)     Physical Exam Constitutional:      General: She is not in acute distress.    Appearance: Normal appearance. She is obese. She is not ill-appearing.  Cardiovascular:     Rate and Rhythm: Normal rate and regular rhythm.     Pulses: Normal pulses.     Heart sounds: Normal heart sounds.  Pulmonary:     Effort: Pulmonary effort is normal. No respiratory distress.     Breath sounds: Normal breath sounds.  Abdominal:     General: Bowel sounds are normal.     Palpations: Abdomen is soft.  Musculoskeletal:     Right shoulder: Tenderness present. No swelling, effusion, bony tenderness or crepitus. Decreased range of motion. Normal strength. Normal pulse.     Right lower leg: No edema.     Left lower leg: No edema.  Neurological:     Mental Status: She is alert.  Psychiatric:        Mood and Affect: Mood normal.        Behavior: Behavior normal.      A/P:  1. Type 2 diabetes mellitus with other specified complication, without long-term current use of insulin ([facility]) - home FBS 160-170 Increase: - metFORMIN (GLUCOPHAGE-XR) 500 MG 24 hr tablet; Take 3 tablets (1,500 mg total) by mouth daily with breakfast.  Dispense: 270 tablet; Refill: 3 - cont all other DM meds - cont crestor and  hyzaar - Lipid panel; Future - Hemoglobin A1c; Future - congratulated pt on 7lbs weight loss since last OV - f/u in 3 mo or sooner PRN  2. Hypertension associated with diabetes ([place]) - controlled - cont hyzaar  3. Hyperlipidemia associated with type 2 diabetes mellitus ([place]), Rx Crestor - cont crestor 20mg  daily - Lipid panel;  Future  4. Acquired hypothyroidism - TSH WNL in 12/2019 - cont levothyroxine 12mcg  5. Constipation, unspecified constipation type - increase colace to BID - start miralax QOD and then increase to daily if needed. Goal is 1 soft BM every 1-2 days  6. Right rotator cuff tendonitis - symptoms x 1+mo - heat BID-TID followed at least once per day by ROM exercises - included in AVS Rx: - predniSONE (DELTASONE) 20 MG tablet; 3 tabs po x 3 days, 2 tabs po x 3 days, 1 tab po x 3 days, 1/2 tab po x 3 days  Dispense: 20 tablet; Refill: 0 - pt cannot tolerate NSAIDs d/t GI side effects - f/u if symptoms worsen or do not improve in about 3-4 wks  Discussed plan and reviewed medications with patient, including risks, benefits, and potential side effects. Pt expressed understand. All questions answered.   This visit occurred during the SARS-CoV-2 public health emergency.  Safety protocols were in place, including screening questions prior to the visit, additional usage of staff PPE, and extensive cleaning of exam room while observing appropriate contact time as indicated for disinfecting solutions.

## 2020-04-01 NOTE — Patient Instructions (Addendum)
Increase colace to 2x/day Use miralax 1 capful every other day, could do daily if needed   Heating pad 2-3x/day Exercises daily Prednisone as directed   Shoulder Exercises Ask your health care provider which exercises are safe for you. Do exercises exactly as told by your health care provider and adjust them as directed. It is normal to feel mild stretching, pulling, tightness, or discomfort as you do these exercises. Stop right away if you feel sudden pain or your pain gets worse. Do not begin these exercises until told by your health care provider. Stretching exercises External rotation and abduction This exercise is sometimes called corner stretch. This exercise rotates your arm outward (external rotation) and moves your arm out from your body (abduction). 1. Stand in a doorway with one of your feet slightly in front of the other. This is called a staggered stance. If you cannot reach your forearms to the door frame, stand facing a corner of a room. 2. Choose one of the following positions as told by your health care provider: ? Place your hands and forearms on the door frame above your head. ? Place your hands and forearms on the door frame at the height of your head. ? Place your hands on the door frame at the height of your elbows. 3. Slowly move your weight onto your front foot until you feel a stretch across your chest and in the front of your shoulders. Keep your head and chest upright and keep your abdominal muscles tight. 4. Hold for __________ seconds. 5. To release the stretch, shift your weight to your back foot. Repeat __________ times. Complete this exercise __________ times a day.   Extension, standing 1. Stand and hold a broomstick, a cane, or a similar object behind your back. ? Your hands should be a little wider than shoulder width apart. ? Your palms should face away from your back. 2. Keeping your elbows straight and your shoulder muscles relaxed, move the stick away  from your body until you feel a stretch in your shoulders (extension). ? Avoid shrugging your shoulders while you move the stick. Keep your shoulder blades tucked down toward the middle of your back. 3. Hold for __________ seconds. 4. Slowly return to the starting position. Repeat __________ times. Complete this exercise __________ times a day. Range-of-motion exercises Pendulum 1. Stand near a wall or a surface that you can hold onto for balance. 2. Bend at the waist and let your left / right arm hang straight down. Use your other arm to support you. Keep your back straight and do not lock your knees. 3. Relax your left / right arm and shoulder muscles, and move your hips and your trunk so your left / right arm swings freely. Your arm should swing because of the motion of your body, not because you are using your arm or shoulder muscles. 4. Keep moving your hips and trunk so your arm swings in the following directions, as told by your health care provider: ? Side to side. ? Forward and backward. ? In clockwise and counterclockwise circles. 5. Continue each motion for __________ seconds, or for as long as told by your health care provider. 6. Slowly return to the starting position. Repeat __________ times. Complete this exercise __________ times a day.   Shoulder flexion, standing 1. Stand and hold a broomstick, a cane, or a similar object. Place your hands a little more than shoulder width apart on the object. Your left / right hand should  be palm up, and your other hand should be palm down. 2. Keep your elbow straight and your shoulder muscles relaxed. Push the stick up with your healthy arm to raise your left / right arm in front of your body, and then over your head until you feel a stretch in your shoulder (flexion). ? Avoid shrugging your shoulder while you raise your arm. Keep your shoulder blade tucked down toward the middle of your back. 3. Hold for __________ seconds. 4. Slowly return  to the starting position. Repeat __________ times. Complete this exercise __________ times a day.   Shoulder abduction, standing 1. Stand and hold a broomstick, a cane, or a similar object. Place your hands a little more than shoulder width apart on the object. Your left / right hand should be palm up, and your other hand should be palm down. 2. Keep your elbow straight and your shoulder muscles relaxed. Push the object across your body toward your left / right side. Raise your left / right arm to the side of your body (abduction) until you feel a stretch in your shoulder. ? Do not raise your arm above shoulder height unless your health care provider tells you to do that. ? If directed, raise your arm over your head. ? Avoid shrugging your shoulder while you raise your arm. Keep your shoulder blade tucked down toward the middle of your back. 3. Hold for __________ seconds. 4. Slowly return to the starting position. Repeat __________ times. Complete this exercise __________ times a day. Internal rotation 1. Place your left / right hand behind your back, palm up. 2. Use your other hand to dangle an exercise band, a towel, or a similar object over your shoulder. Grasp the band with your left / right hand so you are holding on to both ends. 3. Gently pull up on the band until you feel a stretch in the front of your left / right shoulder. The movement of your arm toward the center of your body is called internal rotation. ? Avoid shrugging your shoulder while you raise your arm. Keep your shoulder blade tucked down toward the middle of your back. 4. Hold for __________ seconds. 5. Release the stretch by letting go of the band and lowering your hands. Repeat __________ times. Complete this exercise __________ times a day.   Strengthening exercises External rotation 1. Sit in a stable chair without armrests. 2. Secure an exercise band to a stable object at elbow height on your left / right  side. 3. Place a soft object, such as a folded towel or a small pillow, between your left / right upper arm and your body to move your elbow about 4 inches (10 cm) away from your side. 4. Hold the end of the exercise band so it is tight and there is no slack. 5. Keeping your elbow pressed against the soft object, slowly move your forearm out, away from your abdomen (external rotation). Keep your body steady so only your forearm moves. 6. Hold for __________ seconds. 7. Slowly return to the starting position. Repeat __________ times. Complete this exercise __________ times a day.   Shoulder abduction 1. Sit in a stable chair without armrests, or stand up. 2. Hold a __________ weight in your left / right hand, or hold an exercise band with both hands. 3. Start with your arms straight down and your left / right palm facing in, toward your body. 4. Slowly lift your left / right hand out to your  side (abduction). Do not lift your hand above shoulder height unless your health care provider tells you that this is safe. ? Keep your arms straight. ? Avoid shrugging your shoulder while you do this movement. Keep your shoulder blade tucked down toward the middle of your back. 5. Hold for __________ seconds. 6. Slowly lower your arm, and return to the starting position. Repeat __________ times. Complete this exercise __________ times a day.   Shoulder extension 1. Sit in a stable chair without armrests, or stand up. 2. Secure an exercise band to a stable object in front of you so it is at shoulder height. 3. Hold one end of the exercise band in each hand. Your palms should face each other. 4. Straighten your elbows and lift your hands up to shoulder height. 5. Step back, away from the secured end of the exercise band, until the band is tight and there is no slack. 6. Squeeze your shoulder blades together as you pull your hands down to the sides of your thighs (extension). Stop when your hands are straight  down by your sides. Do not let your hands go behind your body. 7. Hold for __________ seconds. 8. Slowly return to the starting position. Repeat __________ times. Complete this exercise __________ times a day. Shoulder row 1. Sit in a stable chair without armrests, or stand up. 2. Secure an exercise band to a stable object in front of you so it is at waist height. 3. Hold one end of the exercise band in each hand. Position your palms so that your thumbs are facing the ceiling (neutral position). 4. Bend each of your elbows to a 90-degree angle (right angle) and keep your upper arms at your sides. 5. Step back until the band is tight and there is no slack. 6. Slowly pull your elbows back behind you. 7. Hold for __________ seconds. 8. Slowly return to the starting position. Repeat __________ times. Complete this exercise __________ times a day. Shoulder press-ups 1. Sit in a stable chair that has armrests. Sit upright, with your feet flat on the floor. 2. Put your hands on the armrests so your elbows are bent and your fingers are pointing forward. Your hands should be about even with the sides of your body. 3. Push down on the armrests and use your arms to lift yourself off the chair. Straighten your elbows and lift yourself up as much as you comfortably can. ? Move your shoulder blades down, and avoid letting your shoulders move up toward your ears. ? Keep your feet on the ground. As you get stronger, your feet should support less of your body weight as you lift yourself up. 4. Hold for __________ seconds. 5. Slowly lower yourself back into the chair. Repeat __________ times. Complete this exercise __________ times a day.   Wall push-ups 1. Stand so you are facing a stable wall. Your feet should be about one arm-length away from the wall. 2. Lean forward and place your palms on the wall at shoulder height. 3. Keep your feet flat on the floor as you bend your elbows and lean forward toward  the wall. 4. Hold for __________ seconds. 5. Straighten your elbows to push yourself back to the starting position. Repeat __________ times. Complete this exercise __________ times a day.   This information is not intended to replace advice given to you by your health care provider. Make sure you discuss any questions you have with your health care provider. Document Revised: 06/28/2018  Document Reviewed: 04/05/2018 [employer] Patient Education  [place].

## 2020-04-01 NOTE — Addendum Note (Signed)
Addended by: Konrad Saha on: 04/01/2020 02:28 PM   Modules accepted: Orders

## 2020-04-02 LAB — LIPID PANEL
Cholesterol: 133 mg/dL (ref 0–200)
HDL: 44.1 mg/dL (ref >39.00)
NonHDL: 88.98
Total CHOL/HDL Ratio: 3
Triglycerides: 377 mg/dL — ABNORMAL HIGH (ref 0.0–149.0)
VLDL: 75.4 mg/dL — ABNORMAL HIGH (ref 0.0–40.0)

## 2020-04-02 LAB — LDL CHOLESTEROL, DIRECT: Direct LDL: 50 mg/dL

## 2020-04-03 ENCOUNTER — Encounter: Payer: Self-pay | Admitting: Family Medicine

## 2020-04-06 ENCOUNTER — Other Ambulatory Visit: Payer: Medicare Other

## 2020-04-13 DIAGNOSIS — Z87891 Personal history of nicotine dependence: Secondary | ICD-10-CM | POA: Diagnosis not present

## 2020-04-13 DIAGNOSIS — Z7982 Long term (current) use of aspirin: Secondary | ICD-10-CM | POA: Diagnosis not present

## 2020-04-13 DIAGNOSIS — I6523 Occlusion and stenosis of bilateral carotid arteries: Secondary | ICD-10-CM | POA: Diagnosis not present

## 2020-04-13 DIAGNOSIS — I6521 Occlusion and stenosis of right carotid artery: Secondary | ICD-10-CM | POA: Diagnosis not present

## 2020-04-15 ENCOUNTER — Other Ambulatory Visit: Payer: Self-pay

## 2020-04-15 ENCOUNTER — Encounter: Payer: Self-pay | Admitting: Family Medicine

## 2020-04-15 ENCOUNTER — Other Ambulatory Visit: Payer: Self-pay | Admitting: Family Medicine

## 2020-04-15 DIAGNOSIS — E1169 Type 2 diabetes mellitus with other specified complication: Secondary | ICD-10-CM

## 2020-04-15 DIAGNOSIS — E039 Hypothyroidism, unspecified: Secondary | ICD-10-CM

## 2020-04-15 DIAGNOSIS — I152 Hypertension secondary to endocrine disorders: Secondary | ICD-10-CM

## 2020-04-15 DIAGNOSIS — E1159 Type 2 diabetes mellitus with other circulatory complications: Secondary | ICD-10-CM

## 2020-04-15 DIAGNOSIS — K219 Gastro-esophageal reflux disease without esophagitis: Secondary | ICD-10-CM

## 2020-04-15 MED ORDER — TRULICITY 4.5 MG/0.5ML ~~LOC~~ SOAJ: SUBCUTANEOUS | 3 refills | Status: DC

## 2020-04-15 MED ORDER — LEVOTHYROXINE SODIUM 112 MCG PO TABS: 112.0000 ug | ORAL_TABLET | Freq: Every day | ORAL | 3 refills | Status: DC

## 2020-04-15 MED ORDER — PANTOPRAZOLE SODIUM 40 MG PO TBEC: DELAYED_RELEASE_TABLET | ORAL | 1 refills | Status: DC

## 2020-04-15 MED ORDER — LOSARTAN POTASSIUM-HCTZ 100-12.5 MG PO TABS: 1.0000 | ORAL_TABLET | Freq: Every day | ORAL | 1 refills | Status: DC

## 2020-04-15 NOTE — Telephone Encounter (Signed)
Patient sent my chart message asking gor refills.  LOV 04/01/20, FOV 07/08/20.  Dm/cma

## 2020-04-19 NOTE — Progress Notes (Unsigned)
Subjective:   Brenda Griffin is a 75 y.o. female who presents for Medicare Annual (Subsequent) preventive examination.  I connected with Brenda Griffin today by telephone and verified that I am speaking with the correct person using two identifiers. Location patient: home Location provider: work Persons participating in the virtual visit: patient, Engineer, civil (consulting)nurse.    I discussed the limitations, risks, security and privacy concerns of performing an evaluation and management service by telephone and the availability of in person appointments. I also discussed with the patient that there may be a patient responsible charge related to this service. The patient expressed understanding and verbally consented to this telephonic visit.    Interactive audio and video telecommunications were attempted between this provider and patient, however failed, due to patient having technical difficulties OR patient did not have access to video capability.  We continued and completed visit with audio only.  Some vital signs may be absent or patient reported.   Time Spent with patient on telephone encounter: 20 minutes   Review of Systems     Cardiac Risk Factors include: advanced age (>5555men, 79>65 women);diabetes mellitus;dyslipidemia;hypertension;obesity (BMI >30kg/m2);sedentary lifestyle     Objective:    Today's Vitals   04/20/20 1459  Weight: 198 lb (89.8 kg)  Height: 5\' 6"  (1.676 m)  PainSc: 1    Body mass index is 31.96 kg/m.  Advanced Directives 04/20/2020 08/27/2019 01/17/2019 01/16/2017  Does Patient Have a Medical Advance Directive? No No No No  Would patient like information on creating a medical advance directive? Yes (MAU/Ambulatory/Procedural Areas - Information given) - Yes (MAU/Ambulatory/Procedural Areas - Information given) Yes (MAU/Ambulatory/Procedural Areas - Information given)    Current Medications (verified) Outpatient Encounter Medications as of 04/20/2020  Medication Sig  . acetaminophen  (TYLENOL) 500 MG tablet Take 500 mg by mouth as needed.  . ALPRAZolam (XANAX) 0.5 MG tablet Take 1 tablet (0.5 mg total) by mouth at bedtime as needed for anxiety.  Marland Kitchen. aspirin EC 81 MG tablet Take 81 mg by mouth daily. Swallow whole.  . Biotin 1610910000 MCG TABS Take 1 tablet by mouth daily as needed.  . Cyanocobalamin (VITAMIN B-12) 5000 MCG TBDP Take 5,000 mcg by mouth once a week.  . docusate sodium (COLACE) 50 MG capsule Take 50 mg by mouth daily.  . Dulaglutide (TRULICITY) 4.5 MG/0.5ML SOPN INJECT 4.5 MG UNDER THE SKIN ONCE A WEEK AS DIRECTED  . estradiol (ESTRACE) 0.5 MG tablet Take 1 tablet (0.5 mg total) by mouth daily. (Patient taking differently: Take 1 mg by mouth daily.)  . Fexofenadine HCl (WAL-FEX ALLERGY PO) Take 1 tablet by mouth as needed.   Marland Kitchen. glimepiride (AMARYL) 4 MG tablet Take 1 tablet (4 mg total) by mouth 2 (two) times daily.  Marland Kitchen. levothyroxine (SYNTHROID) 112 MCG tablet Take 1 tablet (112 mcg total) by mouth daily before breakfast.  . losartan-hydrochlorothiazide (HYZAAR) 100-12.5 MG tablet Take 1 tablet by mouth daily.  . melatonin 5 MG TABS Take 5 mg by mouth at bedtime as needed.  . metFORMIN (GLUCOPHAGE-XR) 500 MG 24 hr tablet Take 3 tablets (1,500 mg total) by mouth daily with breakfast.  . pantoprazole (PROTONIX) 40 MG tablet TAKE 1 TABLET(40 MG) BY MOUTH DAILY  . rosuvastatin (CRESTOR) 20 MG tablet TAKE 1 TABLET(20 MG) BY MOUTH DAILY  . sitaGLIPtin (JANUVIA) 100 MG tablet Take 100 mg by mouth daily.  . [DISCONTINUED] predniSONE (DELTASONE) 20 MG tablet 3 tabs po x 3 days, 2 tabs po x 3 days, 1 tab po  x 3 days, 1/2 tab po x 3 days   No facility-administered encounter medications on file as of 04/20/2020.    Allergies (verified) Bacitracin, Benzoic acid, Benzyl salicylate, Formaldehyde, Geraniol, Hydroxyethyl methacrylate, Methyl methacrylate [methylmethacrylate], Other, Parabens, Amitiza [lubiprostone], Aspirin, Codeine, Victoza [liraglutide], Butanediol [butylene glycol],  and Tape   History: Past Medical History:  Diagnosis Date  . Anxiety   . Basal cell carcinoma   . Cataract   . Diabetes mellitus without complication ([place])   . Fibroid   . Gallstones   . GERD (gastroesophageal reflux disease)   . Heart murmur   . Hypercholesterolemia   . Hypertension   . IBS (irritable bowel syndrome)   . Infertility, female   . Pneumonia   . Skin cancer   . Thyroid disease    Past Surgical History:  Procedure Laterality Date  . ABDOMINAL HYSTERECTOMY  1997   oophorectomy  . BUNIONECTOMY Bilateral 1997, 2001   right 1997, left 2001  . CATARACT EXTRACTION Bilateral 2006  . CERVICAL CONE BIOPSY  1976  . EYE SURGERY    . GALLBLADDER SURGERY    . LUMBAR DISC SURGERY    . MOHS SURGERY  2005/2007  . PILONIDAL CYST EXCISION  1975  . TONSILLECTOMY  1957   Family History  Problem Relation Age of Onset  . Stroke Mother   . Heart disease Father   . Hyperlipidemia Father   . Hypertension Father   . Hypertension Sister   . Heart disease Paternal Grandmother   . Hypertension Paternal Grandmother   . Crohn's disease Brother   . Diabetes Brother   . Hypertension Brother   . Diabetes Brother   . Colon cancer Maternal Great-grandmother    Social History   Socioeconomic History  . Marital status: Married    Spouse name: Not on file  . Number of children: 0  . Years of education: Not on file  . Highest education level: Not on file  Occupational History  . Occupation: retired  Tobacco Use  . Smoking status: Former Smoker    Packs/day: 1.00    Years: 11.00    Pack years: 11.00    Types: Cigarettes    Start date: 60    Quit date: 03/20/1981    Years since quitting: 39.1  . Smokeless tobacco: Never Used  Vaping Use  . Vaping Use: Never used  Substance and Sexual Activity  . Alcohol use: Yes    Comment: socially  . Drug use: No  . Sexual activity: Yes    Partners: Male    Birth control/protection: Post-menopausal  Other Topics Concern  . Not on  file  Social History Narrative  . Not on file   Social Determinants of Health   Financial Resource Strain: Medium Risk  . Difficulty of Paying Living Expenses: Somewhat hard  Food Insecurity: No Food Insecurity  . Worried About Charity fundraiser in the Last Year: Never true  . Ran Out of Food in the Last Year: Never true  Transportation Needs: No Transportation Needs  . Lack of Transportation (Medical): No  . Lack of Transportation (Non-Medical): No  Physical Activity: Inactive  . Days of Exercise per Week: 0 days  . Minutes of Exercise per Session: 0 min  Stress: No Stress Concern Present  . Feeling of Stress : Not at all  Social Connections: Moderately Isolated  . Frequency of Communication with Friends and Family: More than three times a week  . Frequency of Social Gatherings with  Friends and Family: Once a week  . Attends Religious Services: Never  . Active Member of Clubs or Organizations: No  . Attends Archivist Meetings: Never  . Marital Status: Married    Tobacco Counseling Counseling given: Not Answered   Clinical Intake:  Pre-visit preparation completed: Yes  Pain : 0-10 Pain Score: 1  Pain Location: Shoulder Pain Orientation: Right Pain Onset: In the past 7 days Pain Frequency: Constant     Nutritional Status: BMI > 30  Obese Nutritional Risks: None Diabetes: Yes CBG done?: No Did pt. bring in CBG monitor from home?: No (phone visit)  How often do you need to have someone help you when you read instructions, pamphlets, or other written materials from your doctor or pharmacy?: 1 - Never  Diabetes:  Is the patient diabetic?  Yes  If diabetic, was a CBG obtained today?  No  Did the patient bring in their glucometer from home?  No phone visit How often do you monitor your CBG's? Once per week.   Financial Strains and Diabetes Management:  Are you having any financial strains with the device, your supplies or your medication? No .   Does the patient want to be seen by Chronic Care Management for management of their diabetes?  No  Would the patient like to be referred to a Nutritionist or for Diabetic Management?  No   Diabetic Exams:  Diabetic Eye Exam: Completed 02/05/2020.   Diabetic Foot Exam: Pt has been advised about the importance in completing this exam. To be completed by PCP    Interpreter Needed?: No  Information entered by :: Caroleen Hamman LPN   Activities of Daily Living In your present state of health, do you have any difficulty performing the following activities: 04/20/2020  Hearing? N  Vision? N  Difficulty concentrating or making decisions? N  Walking or climbing stairs? N  Dressing or bathing? N  Doing errands, shopping? N  Preparing Food and eating ? N  Using the Toilet? N  In the past six months, have you accidently leaked urine? N  Do you have problems with loss of bowel control? N  Managing your Medications? N  Managing your Finances? N  Housekeeping or managing your Housekeeping? N  Some recent data might be hidden    Patient Care Team: Ronnald Nian, DO as PCP - General (Family Medicine) Salvadore Dom, MD as Consulting Physician (Obstetrics and Gynecology) Mammography, [place] (Diagnostic Radiology) Bernarda Caffey, MD as Consulting Physician (Ophthalmology) Loletha Carrow Kirke Corin, MD as Consulting Physician (Gastroenterology) Germaine Pomfret, [facility] as Pharmacist (Pharmacist)  Indicate any recent Medical Services you may have received from other than [facility] providers in the past year (date may be approximate).     Assessment:   This is a routine wellness examination for [facility]  Hearing/Vision screen  Hearing Screening   125Hz  250Hz  500Hz  1000Hz  2000Hz  3000Hz  4000Hz  6000Hz  8000Hz   Right ear:           Left ear:           Comments: No issues  Vision Screening Comments: Reading glasses Last eye exam-01/2020-Dr. Sharen Counter  Dietary issues and exercise activities  discussed: Current Exercise Habits: The patient does not participate in regular exercise at present, Exercise limited by: None identified  Goals    . Chronic Care Management     CARE PLAN ENTRY (see longitudinal plan of care for additional care plan information)  Current Barriers:  . Chronic Disease Management support,  education, and care coordination needs related to  Hypertension, Hyperlipidemia, Diabetes, GERD and Hypothyroidism    Hypertension BP Readings from Last 3 Encounters:  01/08/20 122/64  12/26/19 120/72  09/02/19 124/66   . Pharmacist Clinical Goal(s): o Over the next 90 days, patient will work with PharmD and providers to maintain BP goal <130/80 . Current regimen:  o Losartan-HCTZ 100-12.5 mg daily . Interventions: o Discussed low salt diet and exercising as tolerated extensively o Will initiate blood pressure monitoring plan  . Patient self care activities - Over the next 90 days, patient will: o Check Blood Pressure 1-2 times monthly, document, and provide at future appointments o Ensure daily salt intake < 2300 mg/day  Hyperlipidemia Lab Results  Component Value Date/Time   LDLDIRECT 61.0 01/02/2020 01:19 PM   . Pharmacist Clinical Goal(s): o Over the next 90 days, patient will work with PharmD and providers to maintain LDL goal < 70 . Current regimen:  o Rosuvastatin 20 mg daily  . Interventions: o Discussed low cholesterol diet and exercising as tolerated extensively o Will initiate cholesterol monitoring plan   Diabetes Lab Results  Component Value Date/Time   HGBA1C 11.3 (H) 01/02/2020 01:19 PM   HGBA1C 10.4 (A) 06/23/2019 01:42 PM   HGBA1C 10.3 (A) 03/24/2019 01:41 PM   HGBA1C 9.2 (H) 11/22/2018 08:14 AM   . Pharmacist Clinical Goal(s): o Over the next 90 days, patient will work with PharmD and providers to achieve A1c goal <7% . Current regimen:  . Trulicity 4.5 mg weekly  . Glimepiride 4 mg twice daily . Januvia 100 mg daily  . Metformin  ER 500 mg daily  . Interventions: o Discussed carbohydrate counting and exercising as tolerated extensively o Will initiate blood sugar monitoring plan  . Patient self care activities - Over the next 90 days, patient will: o Check blood sugar once daily, document, and provide at future appointments o Contact provider with any episodes of hypoglycemia  Medication management . Pharmacist Clinical Goal(s): o Over the next 90 days, patient will work with PharmD and providers to maintain optimal medication adherence . Current pharmacy: [facility] . Interventions o Comprehensive medication review performed. o Continue current medication management strategy . Patient self care activities - Over the next 90 days, patient will: o Take medications as prescribed o Report any questions or concerns to PharmD and/or provider(s)    . HEMOGLOBIN A1C < 7    . Patient Stated     Eat healthy    . Walk 3 times per week.      Depression Screen PHQ 2/9 Scores 04/20/2020 04/01/2020 01/17/2019 04/16/2018 10/09/2017 01/16/2017 10/11/2016  PHQ - 2 Score 0 0 0 0 0 1 0  PHQ- 9 Score - - - 5 3 - -    Fall Risk Fall Risk  04/20/2020 04/01/2020 01/17/2019 04/16/2018 10/09/2017  Falls in the past year? 1 0 0 0 No  Number falls in past yr: 0 0 - 0 -  Injury with Fall? 0 0 0 0 -  Follow up Falls prevention discussed - Falls evaluation completed;Education provided;Falls prevention discussed - -    FALL RISK PREVENTION PERTAINING TO THE HOME:  Any stairs in or around the home? No  Home free of loose throw rugs in walkways, pet beds, electrical cords, etc? Yes  Adequate lighting in your home to reduce risk of falls? Yes   ASSISTIVE DEVICES UTILIZED TO PREVENT FALLS:  Life alert? No  Use of a cane, walker or w/c? No  Grab bars in the bathroom? Yes  Shower chair or bench in shower? No  Elevated toilet seat or a handicapped toilet? No   TIMED UP AND GO:  Was the test performed? No . Phone visit   Cognitive  Function:Normal cognitive status assessed by  this Nurse Health Advisor. No abnormalities found.       6CIT Screen 01/17/2019  What Year? 0 points  What month? 0 points  What time? 0 points  Count back from 20 0 points  Months in reverse 0 points  Repeat phrase 0 points  Total Score 0    Immunizations Immunization History  Administered Date(s) Administered  . Influenza, High Dose Seasonal PF 01/10/2017, 01/07/2018, 11/29/2018  . Influenza-Unspecified 12/07/2015, 12/10/2019  . PFIZER(Purple Top)SARS-COV-2 Vaccination 04/26/2019, 05/17/2019, 12/29/2019  . Pneumococcal Conjugate-13 02/20/2017  . Pneumococcal Polysaccharide-23 01/09/2013  . Tdap 04/21/2013  . Zoster 04/08/2015  . Zoster Recombinat (Shingrix) 10/20/2017, 12/18/2017    TDAP status: Up to date  Flu Vaccine status: Up to date  Pneumococcal vaccine status: Up to date  Covid-19 vaccine status: Completed vaccines  Qualifies for Shingles Vaccine? No   Zostavax completed Yes   Shingrix Completed?: Yes  Screening Tests Health Maintenance  Topic Date Due  . FOOT EXAM  04/17/2019  . COVID-19 Vaccine (4 - Booster for Pfizer series) 06/28/2020  . HEMOGLOBIN A1C  09/29/2020  . Fecal DNA (Cologuard)  10/30/2020  . OPHTHALMOLOGY EXAM  02/04/2021  . MAMMOGRAM  04/16/2021  . TETANUS/TDAP  04/22/2023  . INFLUENZA VACCINE  Completed  . DEXA SCAN  Completed  . Hepatitis C Screening  Completed  . PNA vac Low Risk Adult  Completed    Health Maintenance  Health Maintenance Due  Topic Date Due  . FOOT EXAM  04/17/2019    Colorectal cancer screening: Type of screening: Cologuard. Completed 10/30/2017. Repeat every 3 years  Mammogram status: Due-Patient would like to call & schedule herself.  Bone Density status: Due-Patient declined  Lung Cancer Screening: (Low Dose CT Chest recommended if Age 31-80 years, 30 pack-year currently smoking OR have quit w/in 15years.) does not qualify.    Additional  Screening:  Hepatitis C Screening:Completed 10/09/2017  Vision Screening: Recommended annual ophthalmology exams for early detection of glaucoma and other disorders of the eye. Is the patient up to date with their annual eye exam?  Yes  Who is the provider or what is the name of the office in which the patient attends annual eye exams? Dr. Sharen Counter   Dental Screening: Recommended annual dental exams for proper oral hygiene  Community Resource Referral / Chronic Care Management: CRR required this visit?  No   CCM required this visit?  No      Plan:     I have personally reviewed and noted the following in the patient's chart:   . Medical and social history . Use of alcohol, tobacco or illicit drugs  . Current medications and supplements . Functional ability and status . Nutritional status . Physical activity . Advanced directives . List of other physicians . Hospitalizations, surgeries, and ER visits in previous 12 months . Vitals . Screenings to include cognitive, depression, and falls . Referrals and appointments  In addition, I have reviewed and discussed with patient certain preventive protocols, quality metrics, and best practice recommendations. A written personalized care plan for preventive services as well as general preventive health recommendations were provided to patient.   Due to this being a telephonic visit, the after visit summary with  patients personalized plan was offered to patient via mail or my-chart. Patient would like to access on my-chart.   Marta Antu, LPN   07/26/5927  Nurse Health Advisor  Nurse Notes: None

## 2020-04-20 ENCOUNTER — Ambulatory Visit (INDEPENDENT_AMBULATORY_CARE_PROVIDER_SITE_OTHER): Payer: Medicare Other

## 2020-04-20 VITALS — Ht 66.0 in | Wt 198.0 lb

## 2020-04-20 DIAGNOSIS — Z0000 Encounter for general adult medical examination without abnormal findings: Secondary | ICD-10-CM

## 2020-04-20 NOTE — Patient Instructions (Signed)
Ms. Brenda Griffin , Thank you for taking time to complete your Medicare Wellness Visit. I appreciate your ongoing commitment to your health goals. Please review the following plan we discussed and let me know if I can assist you in the future.   Screening recommendations/referrals: Colonoscopy: Completed Cologuard 10/30/2017-Due-10/30/2020 Mammogram: Due. Per our conversation, you will call & schedule. Bone Density: Due-Declined today. Please call the office to schedule if you change your mind. Recommended yearly ophthalmology/optometry visit for glaucoma screening and checkup Recommended yearly dental visit for hygiene and checkup  Vaccinations: Influenza vaccine: Up to date Pneumococcal vaccine: Completed vaccines Tdap vaccine: Up to date-Due-04/22/2023 Shingles vaccine: Completed vacines  Covid-19:Completed vaccines  Advanced directives: Information mailed today  Conditions/risks identified: See problem list  Next appointment: Follow up in one year for your annual wellness visit    Preventive Care 65 Years and Older, Female Preventive care refers to lifestyle choices and visits with your health care provider that can promote health and wellness. What does preventive care include?  A yearly physical exam. This is also called an annual well check.  Dental exams once or twice a year.  Routine eye exams. Ask your health care provider how often you should have your eyes checked.  Personal lifestyle choices, including:  Daily care of your teeth and gums.  Regular physical activity.  Eating a healthy diet.  Avoiding tobacco and drug use.  Limiting alcohol use.  Practicing safe sex.  Taking low-dose aspirin every day.  Taking vitamin and mineral supplements as recommended by your health care provider. What happens during an annual well check? The services and screenings done by your health care provider during your annual well check will depend on your age, overall health, lifestyle  risk factors, and family history of disease. Counseling  Your health care provider may ask you questions about your:  Alcohol use.  Tobacco use.  Drug use.  Emotional well-being.  Home and relationship well-being.  Sexual activity.  Eating habits.  History of falls.  Memory and ability to understand (cognition).  Work and work Statistician.  Reproductive health. Screening  You may have the following tests or measurements:  Height, weight, and BMI.  Blood pressure.  Lipid and cholesterol levels. These may be checked every 5 years, or more frequently if you are over 51 years old.  Skin check.  Lung cancer screening. You may have this screening every year starting at age 31 if you have a 30-pack-year history of smoking and currently smoke or have quit within the past 15 years.  Fecal occult blood test (FOBT) of the stool. You may have this test every year starting at age 46.  Flexible sigmoidoscopy or colonoscopy. You may have a sigmoidoscopy every 5 years or a colonoscopy every 10 years starting at age 83.  Hepatitis C blood test.  Hepatitis B blood test.  Sexually transmitted disease (STD) testing.  Diabetes screening. This is done by checking your blood sugar (glucose) after you have not eaten for a while (fasting). You may have this done every 1-3 years.  Bone density scan. This is done to screen for osteoporosis. You may have this done starting at age 16.  Mammogram. This may be done every 1-2 years. Talk to your health care provider about how often you should have regular mammograms. Talk with your health care provider about your test results, treatment options, and if necessary, the need for more tests. Vaccines  Your health care provider may recommend certain vaccines, such as:  Influenza vaccine. This is recommended every year.  Tetanus, diphtheria, and acellular pertussis (Tdap, Td) vaccine. You may need a Td booster every 10 years.  Zoster vaccine.  You may need this after age 33.  Pneumococcal 13-valent conjugate (PCV13) vaccine. One dose is recommended after age 15.  Pneumococcal polysaccharide (PPSV23) vaccine. One dose is recommended after age 29. Talk to your health care provider about which screenings and vaccines you need and how often you need them. This information is not intended to replace advice given to you by your health care provider. Make sure you discuss any questions you have with your health care provider. Document Released: 04/02/2015 Document Revised: 11/24/2015 Document Reviewed: 01/05/2015 Elsevier Interactive Patient Education  2017 [employer] Prevention in the Home Falls can cause injuries. They can happen to people of all ages. There are many things you can do to make your home safe and to help prevent falls. What can I do on the outside of my home?  Regularly fix the edges of walkways and driveways and fix any cracks.  Remove anything that might make you trip as you walk through a door, such as a raised step or threshold.  Trim any bushes or trees on the path to your home.  Use bright outdoor lighting.  Clear any walking paths of anything that might make someone trip, such as rocks or tools.  Regularly check to see if handrails are loose or broken. Make sure that both sides of any steps have handrails.  Any raised decks and porches should have guardrails on the edges.  Have any leaves, snow, or ice cleared regularly.  Use sand or salt on walking paths during winter.  Clean up any spills in your garage right away. This includes oil or grease spills. What can I do in the bathroom?  Use night lights.  Install grab bars by the toilet and in the tub and shower. Do not use towel bars as grab bars.  Use non-skid mats or decals in the tub or shower.  If you need to sit down in the shower, use a plastic, non-slip stool.  Keep the floor dry. Clean up any water that spills on the floor as soon  as it happens.  Remove soap buildup in the tub or shower regularly.  Attach bath mats securely with double-sided non-slip rug tape.  Do not have throw rugs and other things on the floor that can make you trip. What can I do in the bedroom?  Use night lights.  Make sure that you have a light by your bed that is easy to reach.  Do not use any sheets or blankets that are too big for your bed. They should not hang down onto the floor.  Have a firm chair that has side arms. You can use this for support while you get dressed.  Do not have throw rugs and other things on the floor that can make you trip. What can I do in the kitchen?  Clean up any spills right away.  Avoid walking on wet floors.  Keep items that you use a lot in easy-to-reach places.  If you need to reach something above you, use a strong step stool that has a grab bar.  Keep electrical cords out of the way.  Do not use floor polish or wax that makes floors slippery. If you must use wax, use non-skid floor wax.  Do not have throw rugs and other things on the floor that  can make you trip. What can I do with my stairs?  Do not leave any items on the stairs.  Make sure that there are handrails on both sides of the stairs and use them. Fix handrails that are broken or loose. Make sure that handrails are as long as the stairways.  Check any carpeting to make sure that it is firmly attached to the stairs. Fix any carpet that is loose or worn.  Avoid having throw rugs at the top or bottom of the stairs. If you do have throw rugs, attach them to the floor with carpet tape.  Make sure that you have a light switch at the top of the stairs and the bottom of the stairs. If you do not have them, ask someone to add them for you. What else can I do to help prevent falls?  Wear shoes that:  Do not have high heels.  Have rubber bottoms.  Are comfortable and fit you well.  Are closed at the toe. Do not wear sandals.  If  you use a stepladder:  Make sure that it is fully opened. Do not climb a closed stepladder.  Make sure that both sides of the stepladder are locked into place.  Ask someone to hold it for you, if possible.  Clearly mark and make sure that you can see:  Any grab bars or handrails.  First and last steps.  Where the edge of each step is.  Use tools that help you move around (mobility aids) if they are needed. These include:  Canes.  Walkers.  Scooters.  Crutches.  Turn on the lights when you go into a dark area. Replace any light bulbs as soon as they burn out.  Set up your furniture so you have a clear path. Avoid moving your furniture around.  If any of your floors are uneven, fix them.  If there are any pets around you, be aware of where they are.  Review your medicines with your doctor. Some medicines can make you feel dizzy. This can increase your chance of falling. Ask your doctor what other things that you can do to help prevent falls. This information is not intended to replace advice given to you by your health care provider. Make sure you discuss any questions you have with your health care provider. Document Released: 12/31/2008 Document Revised: 08/12/2015 Document Reviewed: 04/10/2014 Elsevier Interactive Patient Education  2017 [employer].

## 2020-04-23 ENCOUNTER — Telehealth: Payer: Self-pay

## 2020-04-23 NOTE — Progress Notes (Signed)
Unable to leave a voice message to  confirmed patient telephone appointment on 04/26/2020 for CCM at 11:00 am with Junius Argyle the Clinical pharmacist.   [place] Pharmacist Assistant [phone]

## 2020-04-26 ENCOUNTER — Telehealth: Payer: Medicare Other

## 2020-04-26 NOTE — Progress Notes (Deleted)
Chronic Care Management Pharmacy Note  04/26/2020 Name:  Brenda Griffin MRN:  [id number] DOB:  11-05-1945  Subjective: Brenda Griffin is an 75 y.o. year old female who is a primary patient of Ronnald Nian, DO.  The CCM team was consulted for assistance with disease management and care coordination needs.    Engaged with patient by telephone for follow up visit in response to provider referral for pharmacy case management and/or care coordination services.   Consent to Services:  The patient was given the following information about Chronic Care Management services today, agreed to services, and gave verbal consent: 1. CCM service includes personalized support from designated clinical staff supervised by the primary care provider, including individualized plan of care and coordination with other care providers 2. 24/7 contact phone numbers for assistance for urgent and routine care needs. 3. Service will only be billed when office clinical staff spend 20 minutes or more in a month to coordinate care. 4. Only one practitioner may furnish and bill the service in a calendar month. 5.The patient may stop CCM services at any time (effective at the end of the month) by phone call to the office staff. 6. The patient will be responsible for cost sharing (co-pay) of up to 20% of the service fee (after annual deductible is met). Patient agreed to services and consent obtained.  Patient Care Team: Ronnald Nian, DO as PCP - General (Family Medicine) Salvadore Dom, MD as Consulting Physician (Obstetrics and Gynecology) Mammography, [place] (Diagnostic Radiology) Bernarda Caffey, MD as Consulting Physician (Ophthalmology) Loletha Carrow Kirke Corin, MD as Consulting Physician (Gastroenterology) Germaine Pomfret, [facility] as Pharmacist (Pharmacist)  Recent office visits: 04/20/20: Patient presented to Caroleen Hamman, LPN for AWV.  2/68/34: Patient presented to Dr. Bryan Lemma for follow-up. A1c improved to  9.7%. LDL stable. Metformin XR increased to 1500 mg daily.   Prednisone course for tendonitis  Recent consult visits: 04/13/20: Patient presented to Dr. Rosana Hoes (Cardiology) for cardiac ultrasound.   Objective:  Lab Results  Component Value Date   CREATININE 0.91 01/02/2020   BUN 15 01/02/2020   GFR 63.02 01/02/2020   GFRNONAA >60 08/27/2019   GFRAA >60 08/27/2019   NA 131 (L) 01/02/2020   K 4.2 01/02/2020   CALCIUM 9.2 01/02/2020   CO2 23 01/02/2020    Lab Results  Component Value Date/Time   HGBA1C 9.7 (A) 04/01/2020 02:26 PM   HGBA1C 11.3 (H) 01/02/2020 01:19 PM   HGBA1C 10.4 (A) 06/23/2019 01:42 PM   HGBA1C 9.2 (H) 11/22/2018 08:14 AM   GFR 63.02 01/02/2020 01:19 PM   GFR 54.04 (L) 11/22/2018 08:14 AM   MICROALBUR <0.7 01/02/2020 01:19 PM   MICROALBUR <0.7 10/09/2017 09:51 AM    Last diabetic Eye exam:  Lab Results  Component Value Date/Time   HMDIABEYEEXA No Retinopathy 02/05/2020 12:00 AM    Last diabetic Foot exam: No results found for: HMDIABFOOTEX   Lab Results  Component Value Date   CHOL 133 04/01/2020   HDL 44.10 04/01/2020   LDLDIRECT 50.0 04/01/2020   TRIG 377.0 (H) 04/01/2020   CHOLHDL 3 04/01/2020    Hepatic Function Latest Ref Rng & Units 01/02/2020 11/22/2018 04/16/2018  Total Protein 6.0 - 8.3 g/dL 6.6 6.4 6.2  Albumin 3.5 - 5.2 g/dL 3.9 4.0 4.0  AST 0 - 37 U/L _0 ALT 0 - 35 U/L _1 Alk Phosphatase 39 - 117 U/L 75 67 62  Total Bilirubin 0.2 -  1.2 mg/dL 0.5 0.5 0.5    Lab Results  Component Value Date/Time   TSH 2.20 01/02/2020 01:19 PM   TSH 3.76 11/22/2018 08:14 AM   FREET4 2.46 (H) 01/02/2020 01:19 PM   FREET4 1.73 (H) 11/22/2018 08:14 AM    CBC Latest Ref Rng & Units 08/27/2019 11/22/2018 04/16/2018  WBC 4.0 - 10.5 K/uL 8.3 8.2 8.8  Hemoglobin 12.0 - 15.0 g/dL 14.4 14.2 14.0  Hematocrit 36.0 - 46.0 % 41.4 41.4 40.9  Platelets 150 - 400 K/uL 292 309.0 344.0    No results found for: VD25OH  Clinical ASCVD: Yes  The  10-year ASCVD risk score [name] DC Jr., et al., 2013) is: 24.6%   Values used to calculate the score:     Age: 25 years     Sex: Female     Is Non-Hispanic African American: No     Diabetic: Yes     Tobacco smoker: No     Systolic Blood Pressure: 161 mmHg     Is BP treated: Yes     HDL Cholesterol: 44.1 mg/dL     Total Cholesterol: 133 mg/dL    Depression screen [facility] 2/9 04/20/2020 04/01/2020 01/17/2019  Decreased Interest 0 0 0  Down, Depressed, Hopeless 0 0 0  PHQ - 2 Score 0 0 0  Altered sleeping - - -  Tired, decreased energy - - -  Change in appetite - - -  Feeling bad or failure about yourself  - - -  Trouble concentrating - - -  Moving slowly or fidgety/restless - - -  Suicidal thoughts - - -  PHQ-9 Score - - -  Difficult doing work/chores - - -     Social History   Tobacco Use  Smoking Status Former Smoker  . Packs/day: 1.00  . Years: 11.00  . Pack years: 11.00  . Types: Cigarettes  . Start date: 29  . Quit date: 03/20/1981  . Years since quitting: 39.1  Smokeless Tobacco Never Used   BP Readings from Last 3 Encounters:  04/01/20 124/72  01/08/20 122/64  12/26/19 120/72   Pulse Readings from Last 3 Encounters:  04/01/20 95  01/08/20 (!) 105  12/26/19 (!) 106   Wt Readings from Last 3 Encounters:  04/20/20 198 lb (89.8 kg)  04/01/20 201 lb 6.4 oz (91.4 kg)  01/08/20 208 lb (94.3 kg)    Assessment/Interventions: Review of patient past medical history, allergies, medications, health status, including review of consultants reports, laboratory and other test data, was performed as part of comprehensive evaluation and provision of chronic care management services.   SDOH:  (Social Determinants of Health) assessments and interventions performed:   CCM Care Plan  Allergies  Allergen Reactions  . Bacitracin Rash  . Benzoic Acid Rash  . Benzyl Salicylate Rash  . Formaldehyde Rash  . Geraniol Rash  . Hydroxyethyl Methacrylate Rash  . Methyl Methacrylate  [Methylmethacrylate] Rash  . Other Rash    Amyl Cinnamaldehyde  . Parabens Hives  . Amitiza [Lubiprostone] Hives and Diarrhea  . Aspirin Nausea And Vomiting  . Codeine Nausea And Vomiting  . Victoza [Liraglutide] Diarrhea  . Butanediol [Butylene Glycol] Rash  . Tape Itching and Rash    Medications Reviewed Today    Reviewed by Marta Antu, LPN (Licensed Practical Nurse) on 04/20/20 at 1502  Med List Status: <None>  Medication Order Taking? Sig Documenting Provider Last Dose Status Informant  acetaminophen (TYLENOL) 500 MG tablet [id number]  Take 500 mg by  mouth as needed. [provider]  Active   ALPRAZolam Duanne Moron) 0.5 MG tablet [id number]  Take 1 tablet (0.5 mg total) by mouth at bedtime as needed for anxiety. Vivi Barrack, MD  Active   aspirin EC 81 MG tablet [id number]  Take 81 mg by mouth daily. Swallow whole. [provider]  Active   Biotin 10000 MCG TABS [id number]  Take 1 tablet by mouth daily as needed. [provider]  Active   Cyanocobalamin (VITAMIN B-12) 5000 MCG TBDP [id number]  Take 5,000 mcg by mouth once a week. [provider]  Active   docusate sodium (COLACE) 50 MG capsule [id number]  Take 50 mg by mouth daily. [provider]  Active   Dulaglutide (TRULICITY) 4.5 TD/3.2KG SOPN [id number]  INJECT 4.5 MG UNDER THE SKIN ONCE A WEEK AS DIRECTED Cirigliano, Mary K, DO  Active   estradiol (ESTRACE) 0.5 MG tablet [id number]  Take 1 tablet (0.5 mg total) by mouth daily.  Patient taking differently: Take 1 mg by mouth daily.   Salvadore Dom, MD  Active            Med Note Michaelle Birks, Jizelle Conkey A   Wed Jan 21, 2020  8:18 AM) Finishing current supply of 1 mg tablets.   Fexofenadine HCl (WAL-FEX ALLERGY PO) [id number]  Take 1 tablet by mouth as needed.  [provider]  Active   glimepiride (AMARYL) 4 MG tablet [id number]  Take 1 tablet (4 mg total) by mouth 2 (two) times daily. Ronnald Nian, DO  Active    levothyroxine (SYNTHROID) 112 MCG tablet [id number]  Take 1 tablet (112 mcg total) by mouth daily before breakfast. Ronnald Nian, DO  Active   losartan-hydrochlorothiazide (HYZAAR) 100-12.5 MG tablet [id number]  Take 1 tablet by mouth daily. Ronnald Nian, DO  Active   melatonin 5 MG TABS [id number]  Take 5 mg by mouth at bedtime as needed. [provider]  Active   metFORMIN (GLUCOPHAGE-XR) 500 MG 24 hr tablet [id number]  Take 3 tablets (1,500 mg total) by mouth daily with breakfast. Ronnald Nian, DO  Active   pantoprazole (PROTONIX) 40 MG tablet [id number]  TAKE 1 TABLET(40 MG) BY MOUTH DAILY Cirigliano, Mary K, DO  Active   predniSONE (DELTASONE) 20 MG tablet [id number]  3 tabs po x 3 days, 2 tabs po x 3 days, 1 tab po x 3 days, 1/2 tab po x 3 days Ronnald Nian, DO  Active   rosuvastatin (CRESTOR) 20 MG tablet [id number]  TAKE 1 TABLET(20 MG) BY MOUTH DAILY Vivi Barrack, MD  Active   sitaGLIPtin (JANUVIA) 100 MG tablet [id number]  Take 100 mg by mouth daily. [provider]  Active           Patient Active Problem List   Diagnosis Date Noted  . Astigmatism of both eyes with presbyopia 11/26/2019  . Diabetes mellitus type 2 without retinopathy ([place]) 11/26/2019  . Posterior vitreous detachment of left eye 11/26/2019  . Vertigo 09/02/2019  . Rhinitis 06/23/2019  . Hypertensive retinopathy of both eyes 07/16/2018  . GERD (gastroesophageal reflux disease) 04/16/2018  . Diabetic polyneuropathy associated with type 2 diabetes mellitus ([place]) 04/15/2017  . Hormone replacement therapy, followed by GYN 10/11/2016  . Cluster headaches, rarely, prn butalbital 10/11/2016  . Type 2 diabetes mellitus with other specified complication ([place]), 45/80/9983  . Hypothyroidism, on Levothyroxine 10/11/2016  . Hypertension associated with diabetes ([place]), Rx Hyzaar 10/11/2016  . Hyperlipidemia associated with  type 2 diabetes mellitus ([place]), Rx Crestor 10/11/2016  . Anxiety,  Rx Xanax, uses sparingly 10/11/2016  . Morbid obesity ([place]) 10/11/2016    Immunization History  Administered Date(s) Administered  . Influenza, High Dose Seasonal PF 01/10/2017, 01/07/2018, 11/29/2018  . Influenza-Unspecified 12/07/2015, 12/10/2019  . PFIZER(Purple Top)SARS-COV-2 Vaccination 04/26/2019, 05/17/2019, 12/29/2019  . Pneumococcal Conjugate-13 02/20/2017  . Pneumococcal Polysaccharide-23 01/09/2013  . Tdap 04/21/2013  . Zoster 04/08/2015  . Zoster Recombinat (Shingrix) 10/20/2017, 12/18/2017    Conditions to be addressed/monitored:  HTN, HLD, DMII and Anxiety  There are no care plans that you recently modified to display for this patient.    Medication Assistance: {MEDASSISTANCEINFO:25044}  Patient's preferred pharmacy is:  [id number] - [place], [place] - [name] AT [place] [name] [place] [address] Phone: [phone] Fax: [phone]  Uses pill box? {Yes or If no, why not?:20788} Pt endorses ***% compliance  We discussed: {Pharmacy options:24294} Patient decided to: {[facility] Pharmacy HMCN:47096}  Follow Up:  {FOLLOWUP:24991}  Plan: {CM FOLLOW UP PLAN:25073}  ***  Current Barriers:  . {pharmacybarriers:24917} . ***  Pharmacist Clinical Goal(s):  Marland Kitchen Over the next *** days, patient will {PHARMACYGOALCHOICES:24921} through collaboration with PharmD and provider.  . ***  Interventions: . 1:1 collaboration with Ronnald Nian, DO regarding development and update of comprehensive plan of care as evidenced by provider attestation and co-signature . Inter-disciplinary care team collaboration (see longitudinal plan of care) . Comprehensive medication review performed; medication list updated in electronic medical record  Hypertension (BP goal <130/80) -controlled -Current treatment: . Losartan-HCTZ 100-12.5 mg daily  -Medications previously tried: NA   -Current home readings:  *** -Current dietary habits: *** -Current exercise habits: *** -{ACTIONS;DENIES/REPORTS:21021675::"Denies"} hypotensive/hypertensive symptoms -Educated on {CCM BP Counseling:25124} -Counseled to monitor BP at home ***, document, and provide log at future appointments -{CCMPHARMDINTERVENTION:25122}  Hyperlipidemia: (LDL goal < 70) -controlled -Current treatment: . Rosuvastatin 20 mg daily -Medications previously tried: ***  -Current dietary patterns: *** -Current exercise habits: *** -Educated on {CCM HLD Counseling:25126} -{CCMPHARMDINTERVENTION:25122}  Diabetes (A1c goal <8%) -uncontrolled -Current medications: . Trulicity 4.5 mg weekly  . Glimepiride 4 mg twice daily  . Metformin XR 500 mg 3 tablets daily  . Januvia 100 mg daily  -Medications previously tried: ***  -Current home glucose readings . fasting glucose: *** . post prandial glucose: *** -{ACTIONS;DENIES/REPORTS:21021675::"Denies"} hypoglycemic/hyperglycemic symptoms -Current meal patterns:  . breakfast: ***  . lunch: ***  . dinner: *** . snacks: *** . drinks: *** -Current exercise: *** -Educated on{CCM DM COUNSELING:25123} -Counseled to check feet daily and get yearly eye exams -{CCMPHARMDINTERVENTION:25122}   Patient Goals/Self-Care Activities . Over the next *** days, patient will:  - {pharmacypatientgoals:24919}  Follow Up Plan: {CM FOLLOW UP GEZM:62947}

## 2020-05-13 ENCOUNTER — Encounter: Payer: Self-pay | Admitting: Family Medicine

## 2020-05-13 ENCOUNTER — Other Ambulatory Visit: Payer: Self-pay | Admitting: Family Medicine

## 2020-05-13 DIAGNOSIS — E1169 Type 2 diabetes mellitus with other specified complication: Secondary | ICD-10-CM

## 2020-05-14 ENCOUNTER — Other Ambulatory Visit: Payer: Self-pay

## 2020-05-14 ENCOUNTER — Encounter: Payer: Self-pay | Admitting: Obstetrics and Gynecology

## 2020-05-14 DIAGNOSIS — E119 Type 2 diabetes mellitus without complications: Secondary | ICD-10-CM

## 2020-05-14 DIAGNOSIS — Z1231 Encounter for screening mammogram for malignant neoplasm of breast: Secondary | ICD-10-CM | POA: Diagnosis not present

## 2020-05-14 MED ORDER — METFORMIN HCL ER 500 MG PO TB24: 1000.0000 mg | ORAL_TABLET | Freq: Two times a day (BID) | ORAL | 3 refills | Status: AC

## 2020-05-17 ENCOUNTER — Telehealth: Payer: Self-pay

## 2020-05-17 NOTE — Progress Notes (Signed)
Chronic Care Management Pharmacy Assistant   Name: Brenda Griffin  MRN: [id number] DOB: [birth year]  Reason for Encounter: Medication Review   PCP : Ronnald Nian, DO  Allergies:   Allergies  Allergen Reactions  . Bacitracin Rash  . Benzoic Acid Rash  . Benzyl Salicylate Rash  . Formaldehyde Rash  . Geraniol Rash  . Hydroxyethyl Methacrylate Rash  . Methyl Methacrylate [Methylmethacrylate] Rash  . Other Rash    Amyl Cinnamaldehyde  . Parabens Hives  . Amitiza [Lubiprostone] Hives and Diarrhea  . Aspirin Nausea And Vomiting  . Codeine Nausea And Vomiting  . Victoza [Liraglutide] Diarrhea  . Butanediol [Butylene Glycol] Rash  . Tape Itching and Rash    Medications: Outpatient Encounter Medications as of 05/17/2020  Medication Sig Note  . acetaminophen (TYLENOL) 500 MG tablet Take 500 mg by mouth as needed.   . ALPRAZolam (XANAX) 0.5 MG tablet Take 1 tablet (0.5 mg total) by mouth at bedtime as needed for anxiety.   Marland Kitchen aspirin EC 81 MG tablet Take 81 mg by mouth daily. Swallow whole.   . Biotin 10000 MCG TABS Take 1 tablet by mouth daily as needed.   . Cyanocobalamin (VITAMIN B-12) 5000 MCG TBDP Take 5,000 mcg by mouth once a week.   . docusate sodium (COLACE) 50 MG capsule Take 50 mg by mouth daily.   Marland Kitchen estradiol (ESTRACE) 0.5 MG tablet Take 1 tablet (0.5 mg total) by mouth daily. (Patient taking differently: Take 1 mg by mouth daily.) 01/21/2020: Finishing current supply of 1 mg tablets.   . Fexofenadine HCl (WAL-FEX ALLERGY PO) Take 1 tablet by mouth as needed.    Marland Kitchen glimepiride (AMARYL) 4 MG tablet Take 1 tablet (4 mg total) by mouth 2 (two) times daily.   Marland Kitchen levothyroxine (SYNTHROID) 112 MCG tablet Take 1 tablet (112 mcg total) by mouth daily before breakfast.   . losartan-hydrochlorothiazide (HYZAAR) 100-12.5 MG tablet Take 1 tablet by mouth daily.   . melatonin 5 MG TABS Take 5 mg by mouth at bedtime as needed.   . metFORMIN (GLUCOPHAGE-XR) 500 MG 24 hr tablet Take  2 tablets (1,000 mg total) by mouth 2 (two) times daily between meals.   . pantoprazole (PROTONIX) 40 MG tablet TAKE 1 TABLET(40 MG) BY MOUTH DAILY   . rosuvastatin (CRESTOR) 20 MG tablet TAKE 1 TABLET(20 MG) BY MOUTH DAILY   . sitaGLIPtin (JANUVIA) 100 MG tablet Take 100 mg by mouth daily.   . TRULICITY 4.5 WJ/1.9JY SOPN INJECT 4.5 MG UNDER THE SKIN ONCE A WEEK AS DIRECTED    No facility-administered encounter medications on file as of 05/17/2020.    Current Diagnosis: Patient Active Problem List   Diagnosis Date Noted  . Astigmatism of both eyes with presbyopia 11/26/2019  . Diabetes mellitus type 2 without retinopathy ([place]) 11/26/2019  . Posterior vitreous detachment of left eye 11/26/2019  . Vertigo 09/02/2019  . Rhinitis 06/23/2019  . Hypertensive retinopathy of both eyes 07/16/2018  . GERD (gastroesophageal reflux disease) 04/16/2018  . Diabetic polyneuropathy associated with type 2 diabetes mellitus ([place]) 04/15/2017  . Hormone replacement therapy, followed by GYN 10/11/2016  . Cluster headaches, rarely, prn butalbital 10/11/2016  . Type 2 diabetes mellitus with other specified complication ([place]), 78/29/5621  . Hypothyroidism, on Levothyroxine 10/11/2016  . Hypertension associated with diabetes ([place]), Rx Hyzaar 10/11/2016  . Hyperlipidemia associated with type 2 diabetes mellitus ([place]), Rx Crestor 10/11/2016  . Anxiety, Rx Xanax, uses sparingly 10/11/2016  . Morbid obesity (  [place]) 10/11/2016    Goals Addressed   None    Performed cost analysis for patient, estimated yearly medication cost of $0.00.  Follow-Up:  Pharmacist Review   Bessie Woodburn Pharmacist Assistant [phone]

## 2020-07-08 ENCOUNTER — Ambulatory Visit: Payer: Medicare Other | Admitting: Family Medicine

## 2020-07-08 DIAGNOSIS — Z0289 Encounter for other administrative examinations: Secondary | ICD-10-CM

## 2020-07-14 ENCOUNTER — Other Ambulatory Visit: Payer: Self-pay

## 2020-07-15 ENCOUNTER — Ambulatory Visit (INDEPENDENT_AMBULATORY_CARE_PROVIDER_SITE_OTHER): Payer: Medicare Other | Admitting: Family Medicine

## 2020-07-15 ENCOUNTER — Encounter: Payer: Self-pay | Admitting: Family Medicine

## 2020-07-15 VITALS — BP 121/80 | HR 97 | Temp 97.9°F | Ht 66.0 in | Wt 200.0 lb

## 2020-07-15 DIAGNOSIS — E039 Hypothyroidism, unspecified: Secondary | ICD-10-CM

## 2020-07-15 DIAGNOSIS — I152 Hypertension secondary to endocrine disorders: Secondary | ICD-10-CM | POA: Diagnosis not present

## 2020-07-15 DIAGNOSIS — E1159 Type 2 diabetes mellitus with other circulatory complications: Secondary | ICD-10-CM

## 2020-07-15 DIAGNOSIS — E1169 Type 2 diabetes mellitus with other specified complication: Secondary | ICD-10-CM | POA: Diagnosis not present

## 2020-07-15 LAB — POCT GLYCOSYLATED HEMOGLOBIN (HGB A1C): Hemoglobin A1C: 8.5 % — AB (ref 4.0–5.6)

## 2020-07-15 MED ORDER — LEVOTHYROXINE SODIUM 112 MCG PO TABS
112.0000 ug | ORAL_TABLET | Freq: Every day | ORAL | 3 refills | Status: DC
Start: 2020-07-15 — End: 2020-08-09

## 2020-07-15 NOTE — Progress Notes (Signed)
Chief Complaint  Patient presents with  . Follow-up    Pt here for 3 month f/u visit.  Pt said she had a dizzy spell a couple of times since last visit.  Pt did vomit a couple of weeks ago, with dizziness.    HPI: *Brenda Griffin is a 75 y.o. female here for DM, HTN, hypothyroidism, follow-up. For DM pt taking glimepiride 4mg  BID, metformin 1000mg  BID, trulicity SQ 4.5mg  weekly. She is also on and ARB and statin (crestor 20mg  daily) For HTN pt is taking hyzaar 100-12.5mg  daily  Pt does check BS at home. Readings: 156-166 Hypoglycemia/Hypergylcemic episodes: few dizzy episodes since last OV in 03/2020. She did not check BS during that time. 2 most recent episodes were watching tv show with changing camera angles, lots of movement  Lab Results  Component Value Date   HGBA1C 9.7 (A) 04/01/2020   Lab Results  Component Value Date   MICROALBUR <0.7 01/02/2020   Lab Results  Component Value Date   CREATININE 0.91 01/02/2020   Lab Results  Component Value Date   CHOL 133 04/01/2020   HDL 44.10 04/01/2020   LDLDIRECT 50.0 04/01/2020   TRIG 377.0 (H) 04/01/2020   CHOLHDL 3 04/01/2020    The 10-year ASCVD risk score [name] DC Jr., et al., 2013) is: 23.9%   Values used to calculate the score:     Age: 29 years     Sex: Female     Is Non-Hispanic African American: No     Diabetic: Yes     Tobacco smoker: No     Systolic Blood Pressure: 115 mmHg     Is BP treated: Yes     HDL Cholesterol: 44.1 mg/dL     Total Cholesterol: 133 mg/dL  Pt also requests a refill of her levothyroxine 168mcg daily.  Lab Results  Component Value Date   TSH 2.20 01/02/2020    Past Medical History:  Diagnosis Date  . Anxiety   . Basal cell carcinoma   . Cataract   . Diabetes mellitus without complication ([place])   . Fibroid   . Gallstones   . GERD (gastroesophageal reflux disease)   . Heart murmur   . Hypercholesterolemia   . Hypertension   . IBS (irritable bowel syndrome)   . Infertility,  female   . Pneumonia   . Skin cancer   . Thyroid disease     Past Surgical History:  Procedure Laterality Date  . ABDOMINAL HYSTERECTOMY  1997   oophorectomy  . BUNIONECTOMY Bilateral 1997, 2001   right 1997, left 2001  . CATARACT EXTRACTION Bilateral 2006  . CERVICAL CONE BIOPSY  1976  . EYE SURGERY    . GALLBLADDER SURGERY    . LUMBAR DISC SURGERY    . MOHS SURGERY  2005/2007  . PILONIDAL CYST EXCISION  1975  . TONSILLECTOMY  1957    Social History   Socioeconomic History  . Marital status: Married    Spouse name: Not on file  . Number of children: 0  . Years of education: Not on file  . Highest education level: Not on file  Occupational History  . Occupation: retired  Tobacco Use  . Smoking status: Former Smoker    Packs/day: 1.00    Years: 11.00    Pack years: 11.00    Types: Cigarettes    Start date: 85    Quit date: 03/20/1981    Years since quitting: 39.3  . Smokeless tobacco: Never  Used  Vaping Use  . Vaping Use: Never used  Substance and Sexual Activity  . Alcohol use: Yes    Comment: socially  . Drug use: No  . Sexual activity: Yes    Partners: Male    Birth control/protection: Post-menopausal  Other Topics Concern  . Not on file  Social History Narrative  . Not on file   Social Determinants of Health   Financial Resource Strain: Medium Risk  . Difficulty of Paying Living Expenses: Somewhat hard  Food Insecurity: No Food Insecurity  . Worried About Charity fundraiser in the Last Year: Never true  . Ran Out of Food in the Last Year: Never true  Transportation Needs: No Transportation Needs  . Lack of Transportation (Medical): No  . Lack of Transportation (Non-Medical): No  Physical Activity: Inactive  . Days of Exercise per Week: 0 days  . Minutes of Exercise per Session: 0 min  Stress: No Stress Concern Present  . Feeling of Stress : Not at all  Social Connections: Moderately Isolated  . Frequency of Communication with Friends and  Family: More than three times a week  . Frequency of Social Gatherings with Friends and Family: Once a week  . Attends Religious Services: Never  . Active Member of Clubs or Organizations: No  . Attends Archivist Meetings: Never  . Marital Status: Married  Human resources officer Violence: Not At Risk  . Fear of Current or Ex-Partner: No  . Emotionally Abused: No  . Physically Abused: No  . Sexually Abused: No    Family History  Problem Relation Age of Onset  . Stroke Mother   . Heart disease Father   . Hyperlipidemia Father   . Hypertension Father   . Hypertension Sister   . Heart disease Paternal Grandmother   . Hypertension Paternal Grandmother   . Crohn's disease Brother   . Diabetes Brother   . Hypertension Brother   . Diabetes Brother   . Colon cancer Maternal Great-grandmother      Immunization History  Administered Date(s) Administered  . Influenza, High Dose Seasonal PF 01/10/2017, 01/07/2018, 11/29/2018  . Influenza-Unspecified 12/07/2015, 12/10/2019  . PFIZER(Purple Top)SARS-COV-2 Vaccination 04/26/2019, 05/17/2019, 12/29/2019  . Pneumococcal Conjugate-13 02/20/2017  . Pneumococcal Polysaccharide-23 01/09/2013  . Tdap 04/21/2013  . Zoster 04/08/2015  . Zoster Recombinat (Shingrix) 10/20/2017, 12/18/2017    Outpatient Encounter Medications as of 07/15/2020  Medication Sig Note  . acetaminophen (TYLENOL) 500 MG tablet Take 500 mg by mouth as needed.   . ALPRAZolam (XANAX) 0.5 MG tablet Take 1 tablet (0.5 mg total) by mouth at bedtime as needed for anxiety.   Marland Kitchen aspirin EC 81 MG tablet Take 81 mg by mouth daily. Swallow whole.   . Biotin 10000 MCG TABS Take 1 tablet by mouth daily as needed.   . Cyanocobalamin (VITAMIN B-12) 5000 MCG TBDP Take 5,000 mcg by mouth once a week.   . docusate sodium (COLACE) 50 MG capsule Take 50 mg by mouth daily.   Marland Kitchen estradiol (ESTRACE) 0.5 MG tablet Take 1 tablet (0.5 mg total) by mouth daily. (Patient taking differently: Take  1 mg by mouth daily.) 01/21/2020: Finishing current supply of 1 mg tablets.   . Fexofenadine HCl (WAL-FEX ALLERGY PO) Take 1 tablet by mouth as needed.    Marland Kitchen glimepiride (AMARYL) 4 MG tablet Take 1 tablet (4 mg total) by mouth 2 (two) times daily.   Marland Kitchen losartan-hydrochlorothiazide (HYZAAR) 100-12.5 MG tablet Take 1 tablet by mouth daily.   Marland Kitchen  melatonin 5 MG TABS Take 5 mg by mouth at bedtime as needed.   . metFORMIN (GLUCOPHAGE-XR) 500 MG 24 hr tablet Take 2 tablets (1,000 mg total) by mouth 2 (two) times daily between meals.   . pantoprazole (PROTONIX) 40 MG tablet TAKE 1 TABLET(40 MG) BY MOUTH DAILY   . rosuvastatin (CRESTOR) 20 MG tablet TAKE 1 TABLET(20 MG) BY MOUTH DAILY   . TRULICITY 4.5 WN/4.6EV SOPN INJECT 4.5 MG UNDER THE SKIN ONCE A WEEK AS DIRECTED   . [DISCONTINUED] levothyroxine (SYNTHROID) 112 MCG tablet Take 1 tablet (112 mcg total) by mouth daily before breakfast.   . levothyroxine (SYNTHROID) 112 MCG tablet Take 1 tablet (112 mcg total) by mouth daily before breakfast.   . sitaGLIPtin (JANUVIA) 100 MG tablet Take 100 mg by mouth daily. (Patient not taking: Reported on 07/15/2020)    No facility-administered encounter medications on file as of 07/15/2020.     ROS: Pertinent positives and negatives noted in HPI. Remainder of ROS non-contributory    Allergies  Allergen Reactions  . Bacitracin Rash  . Benzoic Acid Rash  . Benzyl Salicylate Rash  . Formaldehyde Rash  . Geraniol Rash  . Hydroxyethyl Methacrylate Rash  . Methyl Methacrylate [Methylmethacrylate] Rash  . Other Rash    Amyl Cinnamaldehyde  . Parabens Hives  . Amitiza [Lubiprostone] Hives and Diarrhea  . Aspirin Nausea And Vomiting  . Codeine Nausea And Vomiting  . Victoza [Liraglutide] Diarrhea  . Butanediol [Butylene Glycol] Rash  . Tape Itching and Rash    BP 121/80 (BP Location: Left Arm, Patient Position: Sitting, Cuff Size: Normal)   Pulse 97   Temp 97.9 F (36.6 C) (Oral)   Ht 5\' 6"  (1.676 m)    Wt 200 lb (90.7 kg)   SpO2 98%   BMI 32.28 kg/m   Physical Exam Constitutional:      General: She is not in acute distress.    Appearance: She is obese. She is not ill-appearing.  Cardiovascular:     Rate and Rhythm: Normal rate and regular rhythm.     Pulses: Normal pulses.  Pulmonary:     Effort: Pulmonary effort is normal.     Breath sounds: Normal breath sounds. No wheezing or rhonchi.  Musculoskeletal:     Right lower leg: No edema.     Left lower leg: No edema.  Neurological:     Mental Status: She is alert and oriented to person, place, and time.  Psychiatric:        Mood and Affect: Mood normal.        Behavior: Behavior normal.      A/P:  1. Acquired hypothyroidism - TFTs at goal in 12/2019 Refill: - levothyroxine (SYNTHROID) 112 MCG tablet; Take 1 tablet (112 mcg total) by mouth daily before breakfast.  Dispense: 90 tablet; Refill: 3  2. Type 2 diabetes mellitus with other specified complication, without long-term current use of insulin ([facility]) - POCT HgB A1C - improved from 9.7 to 8.5! - cont current meds - glimepiride 4mg  BID, metformin 1000mg  BID, trulicity SQ 4.5mg  weekly - cont crestor, hyzaar - stressed importance of regular exercise/walking and low sugar/low carb diet - f/u in 3 mo  3. Hypertension associated with diabetes ([place]) - controlled, at goal - cont hyzaar 100-12.5mg  daily - cont low sodium diet and encouraged regular exercise - BMP at next OV in 3 mo   This visit occurred during the SARS-CoV-2 public health emergency.  Safety protocols were in place, including  screening questions prior to the visit, additional usage of staff PPE, and extensive cleaning of exam room while observing appropriate contact time as indicated for disinfecting solutions.

## 2020-08-04 DIAGNOSIS — L821 Other seborrheic keratosis: Secondary | ICD-10-CM | POA: Diagnosis not present

## 2020-08-04 DIAGNOSIS — L814 Other melanin hyperpigmentation: Secondary | ICD-10-CM | POA: Diagnosis not present

## 2020-08-04 DIAGNOSIS — L820 Inflamed seborrheic keratosis: Secondary | ICD-10-CM | POA: Diagnosis not present

## 2020-08-04 DIAGNOSIS — Z85828 Personal history of other malignant neoplasm of skin: Secondary | ICD-10-CM | POA: Diagnosis not present

## 2020-08-08 ENCOUNTER — Encounter: Payer: Self-pay | Admitting: Family Medicine

## 2020-08-08 ENCOUNTER — Other Ambulatory Visit: Payer: Self-pay | Admitting: Family Medicine

## 2020-08-08 DIAGNOSIS — E039 Hypothyroidism, unspecified: Secondary | ICD-10-CM

## 2020-08-31 ENCOUNTER — Encounter: Payer: Self-pay | Admitting: Family Medicine

## 2020-08-31 DIAGNOSIS — E1169 Type 2 diabetes mellitus with other specified complication: Secondary | ICD-10-CM

## 2020-08-31 MED ORDER — TRULICITY 4.5 MG/0.5ML ~~LOC~~ SOAJ: SUBCUTANEOUS | 3 refills | Status: AC

## 2020-08-31 NOTE — Telephone Encounter (Signed)
Last OV 07/15/20 Last fill 05/13/20  #80ml/3

## 2020-10-04 DIAGNOSIS — D8481 Immunodeficiency due to conditions classified elsewhere: Secondary | ICD-10-CM | POA: Diagnosis not present

## 2020-10-04 DIAGNOSIS — E1142 Type 2 diabetes mellitus with diabetic polyneuropathy: Secondary | ICD-10-CM | POA: Diagnosis not present

## 2020-10-04 DIAGNOSIS — I10 Essential (primary) hypertension: Secondary | ICD-10-CM | POA: Diagnosis not present

## 2020-10-04 DIAGNOSIS — E1165 Type 2 diabetes mellitus with hyperglycemia: Secondary | ICD-10-CM | POA: Diagnosis not present

## 2020-10-12 DIAGNOSIS — Z87891 Personal history of nicotine dependence: Secondary | ICD-10-CM | POA: Diagnosis not present

## 2020-10-12 DIAGNOSIS — I6523 Occlusion and stenosis of bilateral carotid arteries: Secondary | ICD-10-CM | POA: Diagnosis not present

## 2020-10-13 ENCOUNTER — Encounter: Payer: Self-pay | Admitting: Family Medicine

## 2020-10-13 DIAGNOSIS — I152 Hypertension secondary to endocrine disorders: Secondary | ICD-10-CM

## 2020-10-13 DIAGNOSIS — K219 Gastro-esophageal reflux disease without esophagitis: Secondary | ICD-10-CM

## 2020-10-13 DIAGNOSIS — E1159 Type 2 diabetes mellitus with other circulatory complications: Secondary | ICD-10-CM

## 2020-10-14 ENCOUNTER — Ambulatory Visit: Payer: Medicare Other | Admitting: Family Medicine

## 2020-10-14 MED ORDER — PANTOPRAZOLE SODIUM 40 MG PO TBEC: DELAYED_RELEASE_TABLET | ORAL | 1 refills | Status: AC

## 2020-10-14 MED ORDER — LOSARTAN POTASSIUM-HCTZ 100-12.5 MG PO TABS
1.0000 | ORAL_TABLET | Freq: Every day | ORAL | 1 refills | Status: AC
Start: 2020-10-14 — End: ?

## 2020-10-22 DIAGNOSIS — I10 Essential (primary) hypertension: Secondary | ICD-10-CM | POA: Diagnosis not present

## 2020-10-22 DIAGNOSIS — E785 Hyperlipidemia, unspecified: Secondary | ICD-10-CM | POA: Diagnosis not present

## 2020-10-22 DIAGNOSIS — E039 Hypothyroidism, unspecified: Secondary | ICD-10-CM | POA: Diagnosis not present

## 2020-10-22 DIAGNOSIS — E1169 Type 2 diabetes mellitus with other specified complication: Secondary | ICD-10-CM | POA: Diagnosis not present

## 2020-10-22 DIAGNOSIS — R011 Cardiac murmur, unspecified: Secondary | ICD-10-CM | POA: Diagnosis not present

## 2021-02-01 ENCOUNTER — Other Ambulatory Visit: Payer: Self-pay | Admitting: Family Medicine

## 2021-02-01 DIAGNOSIS — E1169 Type 2 diabetes mellitus with other specified complication: Secondary | ICD-10-CM

## 2021-02-01 DIAGNOSIS — E785 Hyperlipidemia, unspecified: Secondary | ICD-10-CM

## 2021-05-30 ENCOUNTER — Encounter: Payer: Self-pay | Admitting: Gastroenterology

## 2021-07-31 IMAGING — CT CT ANGIO NECK
1 of 11 series · 5 of 33 positions shown · IV contrast (Omnipaque)
Comparison: Brain MRI from 7 days ago

CLINICAL DATA: Nonspecific dizziness for 1 week with some weakness
and visual disturbance

EXAM:
CT ANGIOGRAPHY HEAD AND NECK
TECHNIQUE: Multidetector CT imaging of the head and neck was performed using
the standard protocol during bolus administration of intravenous
contrast. Multiplanar CT image reconstructions and MIPs were
obtained to evaluate the vascular anatomy. Carotid stenosis
measurements (when applicable) are obtained utilizing NASCET
criteria, using the distal internal carotid diameter as the
denominator.
CONTRAST:  100mL OMNIPAQUE IOHEXOL 350 MG/ML SOLN

[Series 11: axial thin · axial · 0.45mm/px · z∈[+995,+1227]mm · 5 of 348 slices shown]
[im 58/348  soft-tissue]
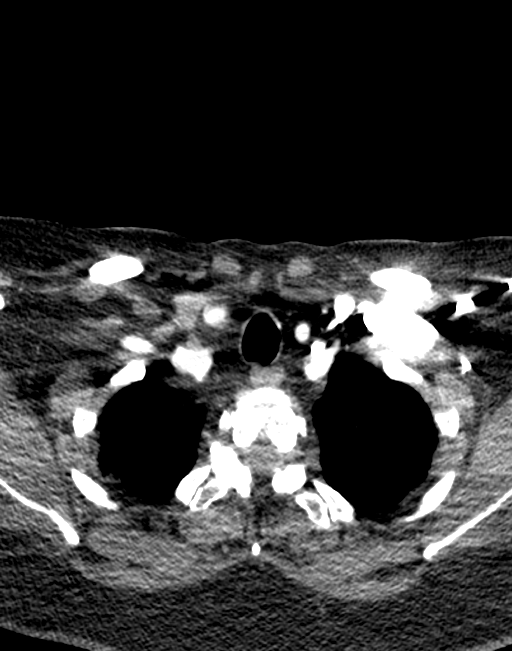
[im 116/348  bone]
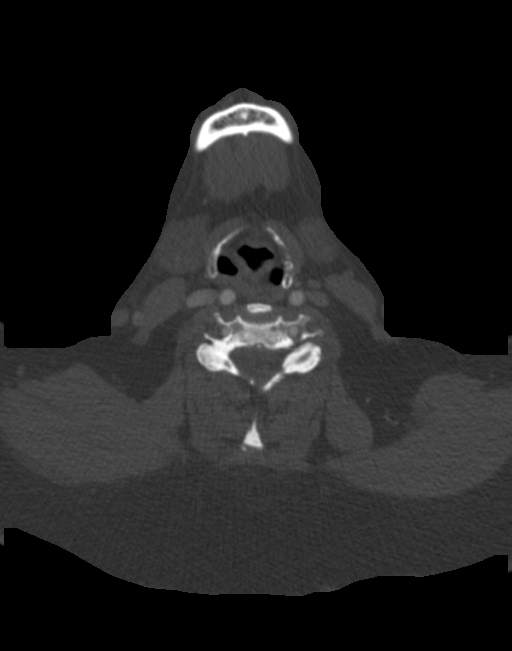
[im 174/348  soft-tissue]
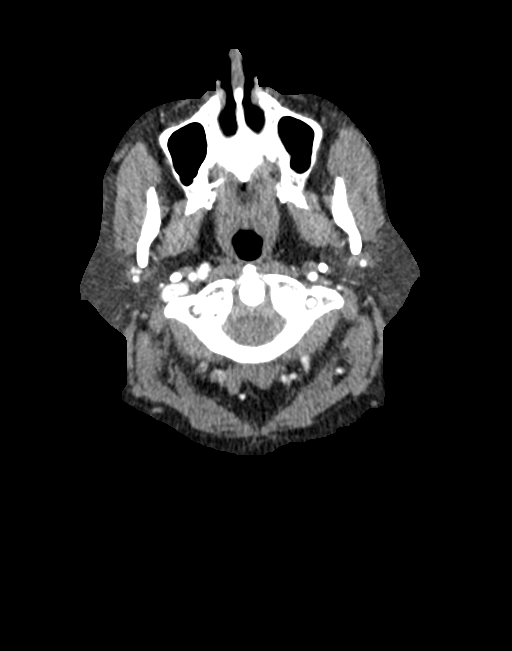
[im 232/348  bone]
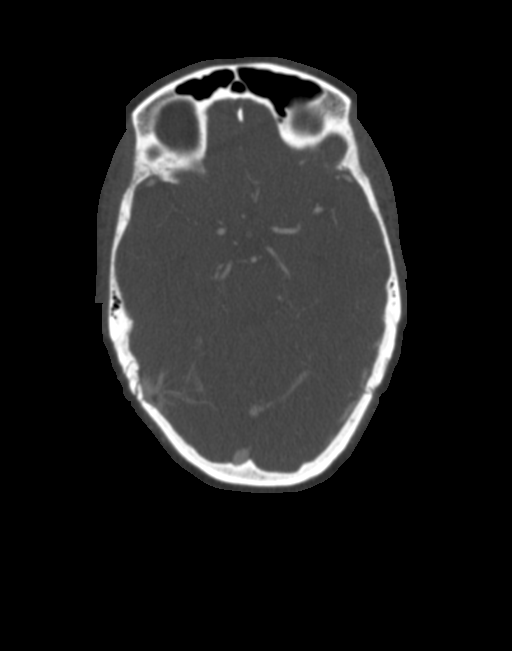
[im 290/348  soft-tissue]
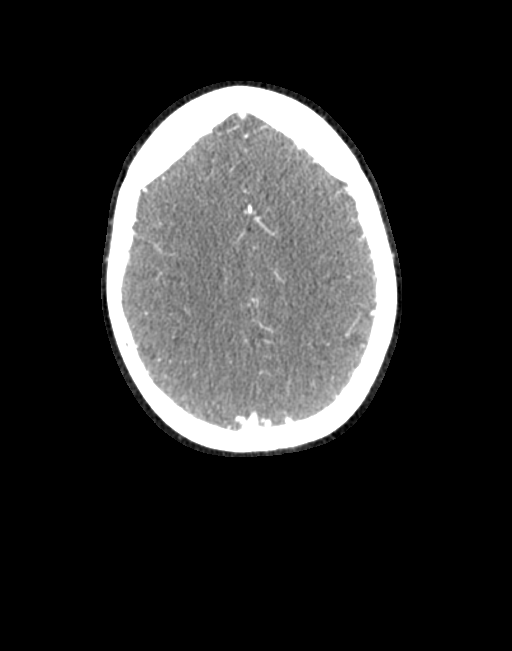

[5 of 33 positions shown; findings below may reference images not displayed]

FINDINGS: CT HEAD

Brain: No evidence of acute infarction, hemorrhage, hydrocephalus,
extra-axial collection or mass lesion/mass effect. Cerebral volume
loss with ventriculomegaly.

Vascular: See below

Skull: No acute or aggressive finding

Sinuses: Clear

Orbits: Negative

CTA HEAD

Arch: Atherosclerotic plaque.  Three vessel branching.

Right carotid:Partial retropharyngeal course of the ICA. Above the
level of the mandibular angle, in the retropharynx, is a severe
proximal ICA stenosis with non measurable lumen, very short segment.
The distal ICA is tortuous with the loop.

Left carotid: Tortuous and partial retropharyngeal ICA. No stenosis
or ulceration.

Vertebral arteries: No proximal subclavian stenosis. Essentially
codominant vertebral arteries that are smooth and widely patent to
the dura.

Skeleton: No acute or aggressive finding

Other neck: Negative

Upper chest: Clear apical lungs

Review of the MIP images confirms the above findings

CTA HEAD FINDINGS

Anterior circulation: Limited atheromatous changes to the siphons.
Hypoplastic right A1 segment and azygos proximal A2 segment. No
branch occlusion, beading, or aneurysm.

Posterior circulation: Calcified plaque at the right V4 segment
without flow limiting stenosis. The basilar is smooth and diffusely
patent. No branch occlusion, beading, or aneurysm.

Venous sinuses: Diffusely patent

Anatomic variants: As above

Review of the MIP images confirms the above findings
IMPRESSION: 1. Severe proximal right ICA stenosis. The narrowed segment is above
the jaw and retropharyngeal. Atheromatous changes are overall mild
and this narrowing is unusually focal and severe.
2. Mild intracranial atherosclerosis.

## 2023-08-15 ENCOUNTER — Emergency Department (HOSPITAL_BASED_OUTPATIENT_CLINIC_OR_DEPARTMENT_OTHER)

## 2023-08-15 ENCOUNTER — Encounter (HOSPITAL_BASED_OUTPATIENT_CLINIC_OR_DEPARTMENT_OTHER): Payer: Self-pay | Admitting: Emergency Medicine

## 2023-08-15 ENCOUNTER — Other Ambulatory Visit: Payer: Self-pay

## 2023-08-15 ENCOUNTER — Emergency Department (HOSPITAL_BASED_OUTPATIENT_CLINIC_OR_DEPARTMENT_OTHER)
Admission: EM | Admit: 2023-08-15 | Discharge: 2023-08-15 | Disposition: A | Discharge status: Home / Self Care | Attending: Emergency Medicine | Admitting: Emergency Medicine

## 2023-08-15 DIAGNOSIS — Z7984 Long term (current) use of oral hypoglycemic drugs: Secondary | ICD-10-CM | POA: Diagnosis not present

## 2023-08-15 DIAGNOSIS — E876 Hypokalemia: Secondary | ICD-10-CM

## 2023-08-15 DIAGNOSIS — K5900 Constipation, unspecified: Secondary | ICD-10-CM

## 2023-08-15 DIAGNOSIS — Z79899 Other long term (current) drug therapy: Secondary | ICD-10-CM | POA: Insufficient documentation

## 2023-08-15 DIAGNOSIS — R748 Abnormal levels of other serum enzymes: Secondary | ICD-10-CM | POA: Insufficient documentation

## 2023-08-15 DIAGNOSIS — I10 Essential (primary) hypertension: Secondary | ICD-10-CM | POA: Insufficient documentation

## 2023-08-15 DIAGNOSIS — D72829 Elevated white blood cell count, unspecified: Secondary | ICD-10-CM | POA: Insufficient documentation

## 2023-08-15 DIAGNOSIS — Z7982 Long term (current) use of aspirin: Secondary | ICD-10-CM | POA: Diagnosis not present

## 2023-08-15 DIAGNOSIS — R1030 Lower abdominal pain, unspecified: Secondary | ICD-10-CM

## 2023-08-15 DIAGNOSIS — Z85828 Personal history of other malignant neoplasm of skin: Secondary | ICD-10-CM | POA: Diagnosis not present

## 2023-08-15 DIAGNOSIS — E119 Type 2 diabetes mellitus without complications: Secondary | ICD-10-CM | POA: Diagnosis not present

## 2023-08-15 DIAGNOSIS — R1031 Right lower quadrant pain: Secondary | ICD-10-CM | POA: Diagnosis present

## 2023-08-15 LAB — CBC
HCT: 44.9 % (ref 36.0–46.0)
Hemoglobin: 15.7 g/dL — ABNORMAL HIGH (ref 12.0–15.0)
MCH: 29.1 pg (ref 26.0–34.0)
MCHC: 35 g/dL (ref 30.0–36.0)
MCV: 83.1 fL (ref 80.0–100.0)
Platelets: 456 10*3/uL — ABNORMAL HIGH (ref 150–400)
RBC: 5.4 MIL/uL — ABNORMAL HIGH (ref 3.87–5.11)
RDW: 13 % (ref 11.5–15.5)
WBC: 13.6 10*3/uL — ABNORMAL HIGH (ref 4.0–10.5)
nRBC: 0 % (ref 0.0–0.2)

## 2023-08-15 LAB — COMPREHENSIVE METABOLIC PANEL WITH GFR
ALT: 12 U/L (ref 0–44)
AST: 18 U/L (ref 15–41)
Albumin: 4.6 g/dL (ref 3.5–5.0)
Alkaline Phosphatase: 50 U/L (ref 38–126)
Anion gap: 20 — ABNORMAL HIGH (ref 5–15)
BUN: 19 mg/dL (ref 8–23)
CO2: 22 mmol/L (ref 22–32)
Calcium: 10.3 mg/dL (ref 8.9–10.3)
Chloride: 94 mmol/L — ABNORMAL LOW (ref 98–111)
Creatinine, Ser: 0.91 mg/dL (ref 0.44–1.00)
GFR, Estimated: >60 mL/min (ref >60)
Glucose, Bld: 166 mg/dL — ABNORMAL HIGH (ref 70–99)
Potassium: 2.8 mmol/L — ABNORMAL LOW (ref 3.5–5.1)
Sodium: 136 mmol/L (ref 135–145)
Total Bilirubin: 0.7 mg/dL (ref 0.0–1.2)
Total Protein: 7.2 g/dL (ref 6.5–8.1)

## 2023-08-15 LAB — MAGNESIUM: Magnesium: 1.8 mg/dL (ref 1.7–2.4)

## 2023-08-15 LAB — LIPASE, BLOOD: Lipase: 82 U/L — ABNORMAL HIGH (ref 11–51)

## 2023-08-15 MED ORDER — POLYETHYLENE GLYCOL 3350 17 G PO PACK
17.0000 g | PACK | Freq: Every day | ORAL | 0 refills | Status: AC
Start: 2023-08-15 — End: ?

## 2023-08-15 MED ORDER — MAGNESIUM CITRATE PO SOLN
1.0000 | Freq: Once | ORAL | 0 refills | Status: AC
Start: 2023-08-15 — End: 2023-08-15

## 2023-08-15 MED ORDER — POTASSIUM CHLORIDE CRYS ER 20 MEQ PO TBCR: 20.0000 meq | EXTENDED_RELEASE_TABLET | Freq: Once | ORAL | Status: DC

## 2023-08-15 MED ORDER — POTASSIUM CHLORIDE 10 MEQ/100ML IV SOLN
10.0000 meq | INTRAVENOUS | Status: DC
  Administered 2023-08-15 (×2): 10 meq via INTRAVENOUS
  Filled 2023-08-15 (×3): qty 100

## 2023-08-15 MED ORDER — FLEET ENEMA RE ENEM
1.0000 | ENEMA | Freq: Once | RECTAL | Status: AC
  Administered 2023-08-15: 1 via RECTAL
  Filled 2023-08-15: qty 1

## 2023-08-15 MED ORDER — POTASSIUM CHLORIDE CRYS ER 20 MEQ PO TBCR: 20.0000 meq | EXTENDED_RELEASE_TABLET | Freq: Two times a day (BID) | ORAL | 0 refills | Status: AC

## 2023-08-15 MED ORDER — POTASSIUM CHLORIDE 20 MEQ PO PACK
20.0000 meq | PACK | Freq: Two times a day (BID) | ORAL | Status: DC
  Administered 2023-08-15: 20 meq via ORAL
  Filled 2023-08-15: qty 1

## 2023-08-15 MED ORDER — LIDOCAINE HCL URETHRAL/MUCOSAL 2 % EX GEL
1.0000 | Freq: Once | CUTANEOUS | Status: AC
  Administered 2023-08-15: 1
  Filled 2023-08-15: qty 11

## 2023-08-15 MED ORDER — ONDANSETRON HCL 4 MG/2ML IJ SOLN
4.0000 mg | Freq: Once | INTRAMUSCULAR | Status: DC
  Filled 2023-08-15: qty 2

## 2023-08-15 MED ORDER — IOHEXOL 300 MG/ML  SOLN
100.0000 mL | Freq: Once | INTRAMUSCULAR | Status: AC | PRN
Start: 2023-08-15 — End: 2023-08-15
  Administered 2023-08-15: 100 mL via INTRAVENOUS

## 2023-08-15 MED ORDER — POTASSIUM CHLORIDE CRYS ER 20 MEQ PO TBCR
40.0000 meq | EXTENDED_RELEASE_TABLET | Freq: Once | ORAL | Status: AC
  Administered 2023-08-15: 40 meq via ORAL
  Filled 2023-08-15: qty 2

## 2023-08-15 NOTE — ED Notes (Signed)
 Pt had episode of emesis immediately after taking the potassium pills. Pt states "I knew this would happen when I saw how big the pills were" this RN informed pt to let nurse know next time she ever has to take large pills and we can ask the provider for nausea medication prior to administration. Pt states she understands. Dr. Tamela Fake notified of emesis episode.

## 2023-08-15 NOTE — Discharge Instructions (Addendum)
 Continue the senokot twice a day.  You may take 4 caps of miralax in one 32oz bottle of gatorade or similar for severe constipation, then then next day 3 caps if no improvement , and the next day 2 caps, then one cap until you are feeling better. If you have not improved after the 4 caps of miralax you could also consider doing the prescribed magnesium  citrate.  Follow up with your doctor for a recheck of your potassium.

## 2023-08-15 NOTE — ED Provider Notes (Signed)
 [facility] Provider Note   CSN: [id number] Arrival date & time: 08/15/23  1053     History {Add pertinent medical, surgical, social history, OB history to HPI:1} Chief Complaint  Patient presents with   Constipation   Abdominal Pain    Brenda Griffin is a 78 y.o. female.  HPI     1 month of constipation 2 weeks severe Over a month ago was last normal bowel movement For past 2 weeks will have leaking, sharp pain bilateral lower abdomen then will have leaking Nausea every once in a while vomiting, not often No vomiting today, but is feeling nausea, waxes and wanes Once in a great while will pass flatus  Has tried colace, senokot, tried milk with lactose intolerance, took 5 colace Abdominal pain when having the oozing but other than that not having constant pain Does feel like something in rectum   No medication changes  No fever, no urinary symptoms   Past Medical History:  Diagnosis Date   Anxiety    Basal cell carcinoma    Cataract    Diabetes mellitus without complication (HCC)    Fibroid    Gallstones    GERD (gastroesophageal reflux disease)    Heart murmur    Hypercholesterolemia    Hypertension    IBS (irritable bowel syndrome)    Infertility, female    Pneumonia    Skin cancer    Thyroid  disease     Past Surgical History:  Procedure Laterality Date   ABDOMINAL HYSTERECTOMY  1997   oophorectomy   BUNIONECTOMY Bilateral 1997, 2001   right 1997, left 2001   CATARACT EXTRACTION Bilateral 2006   CERVICAL CONE BIOPSY  1976   EYE SURGERY     GALLBLADDER SURGERY     LUMBAR DISC SURGERY     MOHS SURGERY  2005/2007   PILONIDAL CYST EXCISION  1975   TONSILLECTOMY  1957     Home Medications Prior to Admission medications   Medication Sig Start Date End Date Taking? Authorizing Provider  acetaminophen (TYLENOL) 500 MG tablet Take 500 mg by mouth as needed.    [provider]  ALPRAZolam  (XANAX )  0.5 MG tablet Take 1 tablet (0.5 mg total) by mouth at bedtime as needed for anxiety. 05/02/19   Rodney Clamp, MD  aspirin EC 81 MG tablet Take 81 mg by mouth daily. Swallow whole.    [provider]  Biotin 40981 MCG TABS Take 1 tablet by mouth daily as needed.    [provider]  Cyanocobalamin  (VITAMIN B-12) 5000 MCG TBDP Take 5,000 mcg by mouth once a week.    [provider]  docusate sodium (COLACE) 50 MG capsule Take 50 mg by mouth daily.    [provider]  Dulaglutide  (TRULICITY ) 4.5 MG/0.5ML SOPN INJECT 4.5 MG UNDER THE SKIN ONCE A WEEK AS DIRECTED 08/31/20   Cirigliano, Kathrine Paris, DO  estradiol  (ESTRACE ) 0.5 MG tablet Take 1 tablet (0.5 mg total) by mouth daily. Patient taking differently: Take 1 mg by mouth daily. 01/08/20   Jertson, Jill Evelyn, MD  Fexofenadine HCl (WAL-FEX ALLERGY PO) Take 1 tablet by mouth as needed.     [provider]  glimepiride  (AMARYL ) 4 MG tablet Take 1 tablet (4 mg total) by mouth 2 (two) times daily. 12/26/19   Cirigliano, Mary K, DO  levothyroxine  (SYNTHROID ) 112 MCG tablet TAKE 1 TABLET(112 MCG) BY MOUTH DAILY BEFORE BREAKFAST 08/09/20   Cirigliano,  Kathrine Paris, DO  losartan -hydrochlorothiazide (HYZAAR) 100-12.5 MG tablet Take 1 tablet by mouth daily. 10/14/20   Cirigliano, Mary K, DO  melatonin 5 MG TABS Take 5 mg by mouth at bedtime as needed.    [provider]  metFORMIN  (GLUCOPHAGE -XR) 500 MG 24 hr tablet Take 2 tablets (1,000 mg total) by mouth 2 (two) times daily between meals. 05/14/20   Cirigliano, Kathrine Paris, DO  pantoprazole  (PROTONIX ) 40 MG tablet TAKE 1 TABLET(40 MG) BY MOUTH DAILY 10/14/20   Cirigliano, Kathrine Paris, DO  rosuvastatin  (CRESTOR ) 20 MG tablet TAKE 1 TABLET(20 MG) BY MOUTH DAILY 12/29/19   Rodney Clamp, MD      Allergies    Bacitracin, Benzoic acid, Benzyl salicylate, Formaldehyde, Geraniol, Hydroxyethyl methacrylate, Methyl methacrylate [methylmethacrylate], Other, Parabens, Amitiza   [lubiprostone ], Aspirin, Codeine, Victoza [liraglutide], Butanediol [butylene glycol], and Tape    Review of Systems   Review of Systems  Physical Exam Updated Vital Signs BP 114/65   Pulse (!) 104   Temp 97.6 F (36.4 C) (Oral)   Resp 16   Wt 66.7 kg   SpO2 98%   BMI 23.73 kg/m  Physical Exam  ED Results / Procedures / Treatments   Labs (all labs ordered are listed, but only abnormal results are displayed) Labs Reviewed  CBC - Abnormal; Notable for the following components:      Result Value   WBC 13.6 (*)    RBC 5.40 (*)    Hemoglobin 15.7 (*)    Platelets 456 (*)    All other components within normal limits  LIPASE, BLOOD  COMPREHENSIVE METABOLIC PANEL WITH GFR  URINALYSIS, ROUTINE W REFLEX MICROSCOPIC    EKG None  Radiology No results found.  Procedures Procedures  {Document cardiac monitor, telemetry assessment procedure when appropriate:1}  Medications Ordered in ED Medications - No data to display  ED Course/ Medical Decision Making/ A&P   {   Click here for ABCD2, HEART and other calculatorsREFRESH Note before signing :1}                              Medical Decision Making Amount and/or Complexity of Data Reviewed Labs: ordered.  Risk OTC drugs. Prescription drug management.   ***  {Document critical care time when appropriate:1} {Document review of labs and clinical decision tools ie heart score, Chads2Vasc2 etc:1}  {Document your independent review of radiology images, and any outside records:1} {Document your discussion with family members, caretakers, and with consultants:1} {Document social determinants of health affecting pt's care:1} {Document your decision making why or why not admission, treatments were needed:1} Final Clinical Impression(s) / ED Diagnoses Final diagnoses:  None    Rx / DC Orders ED Discharge Orders     None

## 2023-08-15 NOTE — ED Triage Notes (Signed)
 Constipation x 1 month , worse last 2 weeks , no normal BM , reports "just oozing" . Abdominal pain and distention , denies nausea or emesis .
